# Patient Record
Sex: Male | Born: 1955
Health system: Southern US, Community
[De-identification: ages and names within clinical notes are randomized; demographics above are authoritative.]

## PROBLEM LIST (undated history)

## (undated) DIAGNOSIS — I639 Cerebral infarction, unspecified: Secondary | ICD-10-CM

## (undated) DIAGNOSIS — E039 Hypothyroidism, unspecified: Secondary | ICD-10-CM

## (undated) DIAGNOSIS — F329 Major depressive disorder, single episode, unspecified: Secondary | ICD-10-CM

## (undated) DIAGNOSIS — F32A Depression, unspecified: Secondary | ICD-10-CM

## (undated) DIAGNOSIS — I10 Essential (primary) hypertension: Secondary | ICD-10-CM

## (undated) DIAGNOSIS — F1910 Other psychoactive substance abuse, uncomplicated: Secondary | ICD-10-CM

## (undated) DIAGNOSIS — I4891 Unspecified atrial fibrillation: Secondary | ICD-10-CM

## (undated) DIAGNOSIS — E785 Hyperlipidemia, unspecified: Secondary | ICD-10-CM

## (undated) HISTORY — PX: ROTATOR CUFF REPAIR: SHX139

## (undated) HISTORY — DX: Essential (primary) hypertension: I10

## (undated) HISTORY — PX: FRACTURE SURGERY: SHX138

## (undated) HISTORY — DX: Major depressive disorder, single episode, unspecified: F32.9

## (undated) HISTORY — DX: Other psychoactive substance abuse, uncomplicated: F19.10

## (undated) HISTORY — DX: Hyperlipidemia, unspecified: E78.5

## (undated) HISTORY — DX: Hypothyroidism, unspecified: E03.9

## (undated) HISTORY — DX: Depression, unspecified: F32.A

---
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
## 2007-10-03 ENCOUNTER — Emergency Department (HOSPITAL_COMMUNITY): Admission: EM | Admit: 2007-10-03 | Discharge: 2007-10-03 | Payer: Self-pay | Admitting: Emergency Medicine

## 2008-11-29 ENCOUNTER — Emergency Department (HOSPITAL_COMMUNITY): Admission: EM | Admit: 2008-11-29 | Discharge: 2008-11-29 | Payer: Self-pay | Admitting: Emergency Medicine

## 2008-12-03 ENCOUNTER — Emergency Department (HOSPITAL_COMMUNITY): Admission: EM | Admit: 2008-12-03 | Discharge: 2008-12-03 | Payer: Self-pay | Admitting: Emergency Medicine

## 2008-12-06 ENCOUNTER — Emergency Department (HOSPITAL_COMMUNITY): Admission: EM | Admit: 2008-12-06 | Discharge: 2008-12-06 | Payer: Self-pay | Admitting: Emergency Medicine

## 2009-01-26 ENCOUNTER — Encounter (HOSPITAL_COMMUNITY): Admission: RE | Admit: 2009-01-26 | Discharge: 2009-02-17 | Payer: Self-pay | Admitting: Orthopedic Surgery

## 2009-02-23 ENCOUNTER — Encounter (HOSPITAL_COMMUNITY): Admission: RE | Admit: 2009-02-23 | Discharge: 2009-03-25 | Payer: Self-pay | Admitting: Orthopedic Surgery

## 2009-03-26 ENCOUNTER — Encounter (HOSPITAL_COMMUNITY): Admission: RE | Admit: 2009-03-26 | Discharge: 2009-04-25 | Payer: Self-pay | Admitting: Orthopedic Surgery

## 2009-04-27 ENCOUNTER — Encounter (HOSPITAL_COMMUNITY): Admission: RE | Admit: 2009-04-27 | Discharge: 2009-05-27 | Payer: Self-pay | Admitting: Orthopedic Surgery

## 2011-02-23 ENCOUNTER — Emergency Department (HOSPITAL_COMMUNITY)
Admission: EM | Admit: 2011-02-23 | Discharge: 2011-02-23 | Disposition: A | Discharge status: Home / Self Care | Payer: Self-pay | Attending: Emergency Medicine | Admitting: Emergency Medicine

## 2011-02-23 ENCOUNTER — Encounter: Payer: Self-pay | Admitting: *Deleted

## 2011-02-23 ENCOUNTER — Emergency Department (HOSPITAL_COMMUNITY): Payer: Self-pay

## 2011-02-23 DIAGNOSIS — M79609 Pain in unspecified limb: Secondary | ICD-10-CM | POA: Insufficient documentation

## 2011-02-23 DIAGNOSIS — S51809A Unspecified open wound of unspecified forearm, initial encounter: Secondary | ICD-10-CM | POA: Insufficient documentation

## 2011-02-23 DIAGNOSIS — IMO0002 Reserved for concepts with insufficient information to code with codable children: Secondary | ICD-10-CM

## 2011-02-23 DIAGNOSIS — F17200 Nicotine dependence, unspecified, uncomplicated: Secondary | ICD-10-CM | POA: Insufficient documentation

## 2011-02-23 MED ORDER — OXYCODONE-ACETAMINOPHEN 5-325 MG PO TABS: 1.0000 | ORAL_TABLET | ORAL | Status: AC | PRN

## 2011-02-23 MED ORDER — BACITRACIN ZINC 500 UNIT/GM EX OINT
TOPICAL_OINTMENT | CUTANEOUS | Status: AC
  Administered 2011-02-23: 1 via TOPICAL
  Filled 2011-02-23: qty 0.9

## 2011-02-23 MED ORDER — BACITRACIN-NEOMYCIN-POLYMYXIN 400-5-5000 EX OINT: TOPICAL_OINTMENT | Freq: Once | CUTANEOUS | Status: DC

## 2011-02-23 MED ORDER — DOUBLE ANTIBIOTIC 500-10000 UNIT/GM EX OINT: TOPICAL_OINTMENT | Freq: Once | CUTANEOUS | Status: DC

## 2011-02-23 MED ORDER — LIDOCAINE HCL (PF) 1 % IJ SOLN
INTRAMUSCULAR | Status: AC
  Filled 2011-02-23: qty 10

## 2011-02-23 MED ORDER — BACITRACIN ZINC 500 UNIT/GM EX OINT
TOPICAL_OINTMENT | Freq: Once | CUTANEOUS | Status: AC
  Administered 2011-02-23: 1 via TOPICAL

## 2011-02-23 MED ORDER — CEPHALEXIN 500 MG PO CAPS: 500.0000 mg | ORAL_CAPSULE | Freq: Four times a day (QID) | ORAL | Status: AC

## 2011-02-23 NOTE — ED Notes (Signed)
Cleaned wound with wound cleanser and placed sterile 4x 4's over lac.  nad noted.

## 2011-02-23 NOTE — ED Notes (Signed)
Pt arrived via EMS with laceration left lower FA, bleeding controlled.  States jumped off back of truck and cut arm on tailgate.  CMS intact, ROM wnl.  nad noted.

## 2011-02-23 NOTE — ED Provider Notes (Signed)
History     CSN: [id number]  Arrival date & time 02/23/11  1014   First MD Initiated Contact with Patient 02/23/11 1055      Chief Complaint  Patient presents with  . Extremity Laceration    (Consider location/radiation/quality/duration/timing/severity/associated sxs/prior treatment) HPI Comments: Patient c/o laceration to his left distal forearm that occurred when he accidentally fell from the back of a truck and thinks he may have cut his arm on the tailgate or the trailer hitch.  He denies numbness, weakness or decreased ROM of the hand or wrist.  Also denies other injuries  Patient is a 56 y.o. male presenting with skin laceration. The history is provided by the patient.  Laceration  The incident occurred 1 to 2 hours ago. The laceration is located on the left arm. The laceration is 9 cm in size. The laceration mechanism was a a metal edge. The pain is mild. The pain has been constant since onset. He reports no foreign bodies present. His tetanus status is UTD.    History reviewed. No pertinent past medical history.  Past Surgical History  Procedure Date  . Rotator cuff repair     right shoulder    No family history on file.  History  Substance Use Topics  . Smoking status: Current Everyday Smoker    Types: Cigarettes  . Smokeless tobacco: Not on file  . Alcohol Use: No      Review of Systems  HENT: Negative for neck pain and neck stiffness.   Musculoskeletal: Negative for back pain, joint swelling and arthralgias.  Skin: Positive for wound.  Neurological: Negative for weakness, numbness and headaches.  Hematological: Does not bruise/bleed easily.    Allergies  Review of patient's allergies indicates no known allergies.  Home Medications  No current outpatient prescriptions on file.  BP 124/92  Pulse 72  Temp(Src) 98 F (36.7 C) (Oral)  Resp 16  Ht 6' (1.829 m)  Wt 200 lb (90.719 kg)  BMI 27.12 kg/m2  SpO2 93%  Physical Exam  Nursing note and  vitals reviewed. Constitutional: He is oriented to person, place, and time. He appears well-developed and well-nourished. No distress.  HENT:  Head: Normocephalic and atraumatic.  Neck: Normal range of motion. Neck supple.  Cardiovascular: Normal rate, regular rhythm and normal heart sounds.   Pulmonary/Chest: Effort normal and breath sounds normal. No respiratory distress. He exhibits no tenderness.  Musculoskeletal: Normal range of motion. He exhibits no edema and no tenderness.       Right forearm: He exhibits laceration.       Arms: Lymphadenopathy:    He has no cervical adenopathy.  Neurological: He is alert and oriented to person, place, and time. He has normal reflexes. No cranial nerve deficit. He exhibits normal muscle tone. Coordination normal.  Skin: Skin is warm and dry.       9 cm flap type laceration to the distal left forearm.  Bleeding controlled.  No obvious tendon, muscle or nerve injury seen.  Pt has full ROM of the wrist , hand and fingers.  Sensation intact, radial pulse is brisk, CR<2 sec    ED Course  Procedures (including critical care time)  Labs Reviewed - No data to display Dg Forearm Left  02/23/2011  *RADIOLOGY REPORT*  Clinical Data: Fall.  Laceration.  LEFT FOREARM - 2 VIEW  Comparison: None.  Findings: Prominent soft tissue injury adjacent to the distal left ulna without underlying fracture or radiopaque foreign body.  Curvature of  the ulna may be related to remote fracture.  Radiopaque material proximal humerus may be outside the patient.  IMPRESSION: Prominent soft tissue injury adjacent to the distal left ulna without underlying fracture or radiopaque foreign body.  Curvature of the ulna may be related to remote fracture.  Radiopaque material proximal humerus may be outside the patient  Original Report Authenticated By: Fuller Canada, M.D.     LACERATION REPAIR Performed by: Maxwell Caul. Authorized by: Maxwell Caul Consent: Verbal consent  obtained. Risks and benefits: risks, benefits and alternatives were discussed Consent given by: patient Patient identity confirmed: provided demographic data Prepped and Draped in normal sterile fashion Wound explored  Laceration Location: left wrist Laceration Length: 9cm  No Foreign Bodies seen or palpated  Anesthesia: local infiltration  Local anesthetic: lidocaine 1% w/o epinephrine  Anesthetic total: 6 ml  Irrigation method: syringe Amount of cleaning: standard  Skin closure: 4-0 prolene Number of sutures: 15 Technique: simple interrupted Patient tolerance: Patient tolerated the procedure well with no immediate complications.    MDM     Wound(s) explored with adequate hemostasis through ROM, no apparent gross foreign body retained, no significant involvement of deep structures such as bone / joint / tendon / or neurovascular involvement noted.  Baseline Strength and Sensation to affected extremity(ies) with normal light touch for Pt, distal NVI with CR< 2 secs and pulse(s) intact to affected extremity(ies).    Bleeding controlled prior to wound closure.  Pt has full ROM of all fingers and wrist.  Grip strength is strong and equal bilaterally.  Radial pulse is brisk.  Pt agrees to return here in 1-2 days for recheck if any signs of infection or increased pain or swelling.   Agrees to return here in 1-2 days for any signs of infection or change in sensation    Perry Martinez, [place] 02/24/11 2212

## 2011-02-25 NOTE — ED Provider Notes (Signed)
Medical screening examination/treatment/procedure(s) were performed by non-physician practitioner and as supervising physician I was immediately available for consultation/collaboration.   Berl Bonfanti W. Reginal Wojcicki, MD 02/25/11 1600 

## 2016-06-27 ENCOUNTER — Encounter: Payer: Self-pay | Admitting: Family Medicine

## 2016-06-27 ENCOUNTER — Ambulatory Visit (INDEPENDENT_AMBULATORY_CARE_PROVIDER_SITE_OTHER): Payer: Self-pay | Admitting: Family Medicine

## 2016-06-27 VITALS — BP 146/96 | HR 64 | Temp 98.2°F | Resp 16 | Ht 72.0 in | Wt 209.0 lb

## 2016-06-27 DIAGNOSIS — Z720 Tobacco use: Secondary | ICD-10-CM

## 2016-06-27 DIAGNOSIS — R030 Elevated blood-pressure reading, without diagnosis of hypertension: Secondary | ICD-10-CM | POA: Insufficient documentation

## 2016-06-27 DIAGNOSIS — Z7689 Persons encountering health services in other specified circumstances: Secondary | ICD-10-CM

## 2016-06-27 DIAGNOSIS — R252 Cramp and spasm: Secondary | ICD-10-CM

## 2016-06-27 DIAGNOSIS — E785 Hyperlipidemia, unspecified: Secondary | ICD-10-CM

## 2016-06-27 LAB — CBC
HCT: 42 % (ref 38.5–50.0)
Hemoglobin: 14.4 g/dL (ref 13.2–17.1)
MCH: 28.4 pg (ref 27.0–33.0)
MCHC: 34.3 g/dL (ref 32.0–36.0)
MCV: 82.8 fL (ref 80.0–100.0)
MPV: 11.3 fL (ref 7.5–12.5)
PLATELETS: 150 10*3/uL (ref 140–400)
RBC: 5.07 MIL/uL (ref 4.20–5.80)
RDW: 15.3 % — AB (ref 11.0–15.0)
WBC: 10.2 10*3/uL (ref 3.8–10.8)

## 2016-06-27 LAB — TSH: TSH: 19.18 mIU/L — ABNORMAL HIGH (ref 0.40–4.50)

## 2016-06-27 LAB — COMPLETE METABOLIC PANEL WITH GFR
ALT: 24 U/L (ref 9–46)
AST: 27 U/L (ref 10–35)
Albumin: 4.3 g/dL (ref 3.6–5.1)
Alkaline Phosphatase: 64 U/L (ref 40–115)
BILIRUBIN TOTAL: 0.4 mg/dL (ref 0.2–1.2)
BUN: 23 mg/dL (ref 7–25)
CO2: 24 mmol/L (ref 20–31)
Calcium: 9.4 mg/dL (ref 8.6–10.3)
Chloride: 104 mmol/L (ref 98–110)
Creat: 1.27 mg/dL — ABNORMAL HIGH (ref 0.70–1.25)
GFR, EST AFRICAN AMERICAN: 69 mL/min (ref >=60)
GFR, EST NON AFRICAN AMERICAN: 60 mL/min (ref >=60)
GLUCOSE: 83 mg/dL (ref 65–99)
POTASSIUM: 4.6 mmol/L (ref 3.5–5.3)
SODIUM: 137 mmol/L (ref 135–146)
TOTAL PROTEIN: 7.1 g/dL (ref 6.1–8.1)

## 2016-06-27 LAB — LIPID PANEL
CHOL/HDL RATIO: 10.2 ratio — AB (ref <5.0)
Cholesterol: 325 mg/dL — ABNORMAL HIGH (ref <200)
HDL: 32 mg/dL — AB (ref >40)
TRIGLYCERIDES: 740 mg/dL — AB (ref <150)

## 2016-06-27 NOTE — Progress Notes (Signed)
Chief Complaint  Patient presents with  . Establish Care   New to establish No medical care in years I have discussed the multiple health risks associated with cigarette smoking including, but not limited to, cardiovascular disease, lung disease and cancer.  I have strongly recommended that smoking be stopped.  I have reviewed the various methods of quitting including cold [place], classes, nicotine replacements and prescription medications.  I have offered assistance in this difficult process.  The patient is not interested in assistance at this time. Has elevated BP but denies history hypertension Has history hyperlipidemia, took lipitor "20 years ago" No history heart problems His biggest issue is nocturnal leg cramps +/- restless legs Cramps in "hamstring" at night after a lot of ladder climbing during the day.  Has tried quinine, salt, "holistic" treatments.  Worsening for 2-3 years Vague R leg injury after falling from ladder - pain and numbness and weakness with slow recovery.    No shots No colonoscopy Discussed Hep C.  He says he did IV drugs in his youth  I recommend screening.  He declines due to lack of insurance.   Patient Active Problem List   Diagnosis Date Noted  . Tobacco abuse 06/27/2016  . Hyperlipidemia 06/27/2016  . Bilateral leg cramps 06/27/2016  . Elevated blood pressure, situational 06/27/2016    Outpatient Encounter Prescriptions as of 06/27/2016  Medication Sig  . ibuprofen (ADVIL,MOTRIN) 200 MG tablet Take 200 mg by mouth every 6 (six) hours as needed.   No facility-administered encounter medications on file as of 06/27/2016.     Past Medical History:  Diagnosis Date  . Depression   . Hyperlipidemia   . Hypertension     Past Surgical History:  Procedure Laterality Date  . FRACTURE SURGERY     right foot  . ROTATOR CUFF REPAIR     right shoulder    Social History   Social History  . Marital status: Significant Other    Spouse name: [name]    . Number of children: 2  . Years of education: GED/ tech   Occupational History  . build houses     self   Social History Main Topics  . Smoking status: Current Every Day Smoker    Packs/day: 0.50    Years: 50.00    Types: Cigarettes    Start date: 06/28/1966  . Smokeless tobacco: Never Used     Comment: previously smoked 1ppd  . Alcohol use No  . Drug use: No  . Sexual activity: Not Currently   Other Topics Concern  . Not on file   Social History Narrative   Lives with Ursula AlertWendi and her 63 year old daughter and 63 year old son   Self employed   - build houses    Family History  Problem Relation Age of Onset  . Heart disease Mother     dieed at 983  . Arthritis Mother   . Diabetes Mother   . Heart disease Father 45    bypass  . Arthritis Father   . Hyperlipidemia Father   . Hypertension Father   . Rheumatic fever Father     Review of Systems  Constitutional: Positive for malaise/fatigue. Negative for chills, fever and weight loss.  HENT: Negative for congestion and hearing loss.        Poor dentition  Eyes: Negative for blurred vision and pain.  Respiratory: Negative for cough and shortness of breath.   Cardiovascular: Negative for chest pain, palpitations and  leg swelling.  Gastrointestinal: Negative for blood in stool, constipation, diarrhea and heartburn.  Genitourinary: Negative for dysuria and frequency.  Musculoskeletal: Positive for back pain and myalgias. Negative for falls and joint pain.  Neurological: Negative for dizziness, seizures and headaches.  Psychiatric/Behavioral: Positive for depression. The patient is nervous/anxious and has insomnia.     BP (!) 146/96 (BP Location: Right Arm, Patient Position: Sitting, Cuff Size: Normal)   Pulse 64   Temp 98.2 F (36.8 C) (Temporal)   Resp 16   Ht 6' (1.829 m)   Wt 209 lb 0.6 oz (94.8 kg)   SpO2 97%   BMI 28.35 kg/m   Physical Exam  Constitutional: He is oriented to person, place, and time. He  appears well-developed and well-nourished. He appears distressed.  Mildly anxious  HENT:  Head: Normocephalic and atraumatic.  Mouth/Throat: Oropharynx is clear and moist.  Poor dentition  Eyes: Conjunctivae are normal. Pupils are equal, round, and reactive to light.  Neck: Normal range of motion.  Cardiovascular: Normal rate, regular rhythm, normal heart sounds and intact distal pulses.   One ectopic beat  Pulmonary/Chest: Effort normal and breath sounds normal. No respiratory distress. He has no wheezes.  Abdominal: Soft. Bowel sounds are normal.  No HSM  Musculoskeletal: Normal range of motion.  Right calf atrophy noted  Lymphadenopathy:    He has no cervical adenopathy.  Neurological: He is alert and oriented to person, place, and time. He displays normal reflexes. A sensory deficit is present. Coordination normal.  Skin: Skin is warm and dry.  Psychiatric: His behavior is normal. Thought content normal.    1. Encounter to establish care with new doctor  2. Tobacco abuse Advised to quit.  Offered assistance.  "too nervous"  3. Hyperlipidemia, unspecified hyperlipidemia type  4. Bilateral leg cramps  - CBC - COMPLETE METABOLIC PANEL WITH GFR - Lipid panel - VITAMIN D 25 Hydroxy (Vit-D Deficiency, Fractures) - Urinalysis, Routine w reflex microscopic - Magnesium - TSH  5. Elevated blood pressure, situational    Patient Instructions  Drink plenty of water Stretch the legs every night before bed Take vitamin B complex a couple of times a day I will send you a letter with your test results.  If there is anything of concern, we will call right away. Come back for a blood pressure check next week Stop adding salt  See me yearly Call or e mail sooner for problems    Eustace Moore, MD

## 2016-06-27 NOTE — Patient Instructions (Signed)
Drink plenty of water Stretch the legs every night before bed Take vitamin B complex a couple of times a day I will send you a letter with your test results.  If there is anything of concern, we will call right away. Come back for a blood pressure check next week Stop adding salt  See me yearly Call or e mail sooner for problems

## 2016-06-28 ENCOUNTER — Encounter: Payer: Self-pay | Admitting: Family Medicine

## 2016-06-28 ENCOUNTER — Other Ambulatory Visit: Payer: Self-pay | Admitting: Family Medicine

## 2016-06-28 DIAGNOSIS — E039 Hypothyroidism, unspecified: Secondary | ICD-10-CM

## 2016-06-28 DIAGNOSIS — E559 Vitamin D deficiency, unspecified: Secondary | ICD-10-CM | POA: Insufficient documentation

## 2016-06-28 HISTORY — DX: Hypothyroidism, unspecified: E03.9

## 2016-06-28 LAB — URINALYSIS, ROUTINE W REFLEX MICROSCOPIC
Bilirubin Urine: NEGATIVE
GLUCOSE, UA: NEGATIVE
HGB URINE DIPSTICK: NEGATIVE
KETONES UR: NEGATIVE
Nitrite: NEGATIVE
PH: 6 (ref 5.0–8.0)
PROTEIN: NEGATIVE
Specific Gravity, Urine: 1.019 (ref 1.001–1.035)

## 2016-06-28 LAB — MAGNESIUM: MAGNESIUM: 2.1 mg/dL (ref 1.5–2.5)

## 2016-06-28 LAB — URINALYSIS, MICROSCOPIC ONLY
Bacteria, UA: NONE SEEN [HPF]
CRYSTALS: NONE SEEN [HPF]
Casts: NONE SEEN [LPF]
Squamous Epithelial / LPF: NONE SEEN [HPF] (ref <=5)
Yeast: NONE SEEN [HPF]

## 2016-06-28 LAB — VITAMIN D 25 HYDROXY (VIT D DEFICIENCY, FRACTURES): Vit D, 25-Hydroxy: 24 ng/mL — ABNORMAL LOW (ref 30–100)

## 2016-06-28 MED ORDER — LOVASTATIN 40 MG PO TABS: 40.0000 mg | ORAL_TABLET | Freq: Every day | ORAL | 3 refills | Status: DC

## 2016-06-28 MED ORDER — LEVOTHYROXINE SODIUM 100 MCG PO TABS: 100.0000 ug | ORAL_TABLET | Freq: Every day | ORAL | 3 refills | Status: DC

## 2016-07-28 ENCOUNTER — Ambulatory Visit: Payer: Self-pay | Admitting: Family Medicine

## 2017-05-01 ENCOUNTER — Encounter: Payer: Self-pay | Admitting: Family Medicine

## 2018-07-04 ENCOUNTER — Other Ambulatory Visit: Payer: Self-pay

## 2018-07-04 ENCOUNTER — Emergency Department: Payer: Medicare Other

## 2018-07-04 ENCOUNTER — Emergency Department
Admission: EM | Admit: 2018-07-04 | Discharge: 2018-07-04 | Disposition: A | Discharge status: Home / Self Care | Payer: Medicare Other | Attending: Emergency Medicine | Admitting: Emergency Medicine

## 2018-07-04 DIAGNOSIS — R0600 Dyspnea, unspecified: Secondary | ICD-10-CM | POA: Diagnosis not present

## 2018-07-04 DIAGNOSIS — I10 Essential (primary) hypertension: Secondary | ICD-10-CM | POA: Insufficient documentation

## 2018-07-04 DIAGNOSIS — F17210 Nicotine dependence, cigarettes, uncomplicated: Secondary | ICD-10-CM | POA: Diagnosis not present

## 2018-07-04 DIAGNOSIS — R0602 Shortness of breath: Secondary | ICD-10-CM | POA: Diagnosis present

## 2018-07-04 DIAGNOSIS — I4891 Unspecified atrial fibrillation: Secondary | ICD-10-CM | POA: Diagnosis not present

## 2018-07-04 LAB — CBC
HCT: 41.5 % (ref 39.0–52.0)
Hemoglobin: 12.9 g/dL — ABNORMAL LOW (ref 13.0–17.0)
MCH: 27.7 pg (ref 26.0–34.0)
MCHC: 31.1 g/dL (ref 30.0–36.0)
MCV: 89.2 fL (ref 80.0–100.0)
Platelets: 151 10*3/uL (ref 150–400)
RBC: 4.65 MIL/uL (ref 4.22–5.81)
RDW: 14.6 % (ref 11.5–15.5)
WBC: 9 10*3/uL (ref 4.0–10.5)
nRBC: 0 % (ref 0.0–0.2)

## 2018-07-04 LAB — COMPREHENSIVE METABOLIC PANEL
ALT: 48 U/L — ABNORMAL HIGH (ref 0–44)
AST: 35 U/L (ref 15–41)
Albumin: 4.1 g/dL (ref 3.5–5.0)
Alkaline Phosphatase: 80 U/L (ref 38–126)
Anion gap: 8 (ref 5–15)
BUN: 24 mg/dL — ABNORMAL HIGH (ref 8–23)
CO2: 24 mmol/L (ref 22–32)
Calcium: 9.4 mg/dL (ref 8.9–10.3)
Chloride: 107 mmol/L (ref 98–111)
Creatinine, Ser: 0.86 mg/dL (ref 0.61–1.24)
GFR calc Af Amer: >60 mL/min (ref >60)
GFR calc non Af Amer: >60 mL/min (ref >60)
Glucose, Bld: 94 mg/dL (ref 70–99)
Potassium: 4.3 mmol/L (ref 3.5–5.1)
Sodium: 139 mmol/L (ref 135–145)
Total Bilirubin: 1 mg/dL (ref 0.3–1.2)
Total Protein: 7.5 g/dL (ref 6.5–8.1)

## 2018-07-04 LAB — TROPONIN I: Troponin I: 0.04 ng/mL (ref <0.03)

## 2018-07-04 MED ORDER — TRAZODONE HCL 50 MG PO TABS: 50.0000 mg | ORAL_TABLET | Freq: Every evening | ORAL | 0 refills | Status: DC | PRN

## 2018-07-04 MED ORDER — APIXABAN 5 MG PO TABS
5.0000 mg | ORAL_TABLET | Freq: Two times a day (BID) | ORAL | Status: DC
  Administered 2018-07-04: 15:00:00 5 mg via ORAL

## 2018-07-04 MED ORDER — APIXABAN 5 MG PO TABS: 5.0000 mg | ORAL_TABLET | Freq: Two times a day (BID) | ORAL | 1 refills | Status: DC

## 2018-07-04 MED ORDER — METOPROLOL SUCCINATE ER 50 MG PO TB24: 50.0000 mg | ORAL_TABLET | Freq: Every day | ORAL | 1 refills | Status: DC

## 2018-07-04 MED ORDER — DILTIAZEM HCL 25 MG/5ML IV SOLN
10.0000 mg | Freq: Once | INTRAVENOUS | Status: AC
  Administered 2018-07-04: 10 mg via INTRAVENOUS
  Filled 2018-07-04 (×2): qty 5

## 2018-07-04 MED ORDER — DILTIAZEM HCL 60 MG PO TABS
60.0000 mg | ORAL_TABLET | Freq: Once | ORAL | Status: AC
  Administered 2018-07-04: 60 mg via ORAL
  Filled 2018-07-04: qty 1

## 2018-07-04 NOTE — ED Provider Notes (Signed)
[facility] Emergency Department Provider Note  Time seen: 1:28 PM  I have reviewed the triage vital signs and the nursing notes.   HISTORY  Chief Complaint Shortness of Breath   HPI Perry Martinez is a 63 y.o. male with a past medical history of hypertension, hyperlipidemia, depression, anxiety, presents to the emergency department for shortness of breath.  According to the patient over the past 1 month he has been feeling intermittently short of breath especially with exertion.  He states he can only carry a bag of concrete approximately 50 feet before he would have to rest.  Patient denies any chest pain.  Denies any fever cough.  Patient's main concern today appears to be anxiety.  States he has been feeling very anxious over the past few weeks, states he has not been sleeping well at night which he believes is contributing to his symptoms.  Patient is a daily smoker of approximately half a pack per day.  Patient went to urgent care today for evaluation and was sent to the emergency department.   Past Medical History:  Diagnosis Date  . Depression   . Hyperlipidemia   . Hypertension   . Hypothyroidism 06/28/2016  . Substance abuse (HCC)    IV drugs when younger    Patient Active Problem List   Diagnosis Date Noted  . Hypothyroidism 06/28/2016  . Vitamin D deficiency 06/28/2016  . Tobacco abuse 06/27/2016  . Hyperlipidemia 06/27/2016  . Bilateral leg cramps 06/27/2016  . Elevated blood pressure, situational 06/27/2016    Past Surgical History:  Procedure Laterality Date  . FRACTURE SURGERY     right foot  . ROTATOR CUFF REPAIR     right shoulder    Prior to Admission medications   Medication Sig Start Date End Date Taking? Authorizing Provider  ibuprofen (ADVIL,MOTRIN) 200 MG tablet Take 200 mg by mouth every 6 (six) hours as needed.    [provider]  levothyroxine (SYNTHROID, LEVOTHROID) 100 MCG tablet Take 1 tablet (100 mcg total)  by mouth daily. 06/28/16   Eustace Moore, MD  lovastatin (MEVACOR) 40 MG tablet Take 1 tablet (40 mg total) by mouth at bedtime. 06/28/16   Eustace Moore, MD    No Known Allergies  Family History  Problem Relation Age of Onset  . Heart disease Mother        dieed at 74  . Arthritis Mother   . Diabetes Mother   . Heart disease Father 45       bypass  . Arthritis Father   . Hyperlipidemia Father   . Hypertension Father   . Rheumatic fever Father     Social History Social History   Tobacco Use  . Smoking status: Current Every Day Smoker    Packs/day: 0.50    Years: 50.00    Pack years: 25.00    Types: Cigarettes    Start date: 06/28/1966  . Smokeless tobacco: Never Used  . Tobacco comment: previously smoked 1ppd  Substance Use Topics  . Alcohol use: No  . Drug use: No    Review of Systems Constitutional: Negative for fever. ENT: Negative for recent illness/congestion Cardiovascular: Negative for chest pain. Respiratory: Mild shortness of breath worse with exertion over the past 1 months Gastrointestinal: Negative for abdominal pain, vomiting Genitourinary: Negative for urinary compaints Musculoskeletal: Negative for musculoskeletal complaints Skin: Negative for skin complaints  Neurological: Negative for headache All other ROS negative  ____________________________________________   PHYSICAL EXAM:  VITAL SIGNS: ED Triage Vitals  Enc Vitals Group     BP 07/04/18 1325 (!) 119/95     Pulse Rate 07/04/18 1325 (!) 126     Resp 07/04/18 1314 16     Temp 07/04/18 1325 98.1 F (36.7 C)     Temp Source 07/04/18 1325 Oral     SpO2 07/04/18 1314 99 %     Weight 07/04/18 1220 200 lb (90.7 kg)     Height 07/04/18 1220 6' (1.829 m)     Head Circumference --      Peak Flow --      Pain Score 07/04/18 1220 0     Pain Loc --      Pain Edu? --      Excl. in GC? --     Constitutional: Alert and oriented. Well appearing and in no distress. Eyes: Normal  exam ENT      Head: Normocephalic and atraumatic      Mouth/Throat: Mucous membranes are moist. Cardiovascular: Normal rate, regular rhythm.  Respiratory: Normal respiratory effort without tachypnea nor retractions. Breath sounds are clear  Gastrointestinal: Soft and nontender. No distention.   Musculoskeletal: Nontender with normal range of motion in all extremities. Neurologic:  Normal speech and language. No gross focal neurologic deficits Skin:  Skin is warm, dry and intact.  Psychiatric: Mood and affect are normal.   ____________________________________________    EKG  EKG viewed and interpreted by myself shows atrial fibrillation at 117 bpm, narrow QRS, normal axis, normal intervals, no concerning ST changes.  ____________________________________________    RADIOLOGY  Chest x-ray shows cardiomegaly with interstitial edema.  ____________________________________________   INITIAL IMPRESSION / ASSESSMENT AND PLAN / ED COURSE  Pertinent labs & imaging results that were available during my care of the patient were reviewed by me and considered in my medical decision making (see chart for details).   Patient presents to the emergency department for shortness of breath worse with exertion over the past 1 month.  Denies any chest pain.  Denies any fever cough.  Patient also states he feels anxious, and has been having trouble sleeping at night.  Overall the patient appears well, no distress.  Clear lung sounds on exam.  We will check labs including cardiac enzymes, chest x-ray and continue to closely monitor.  Patient's work-up shows a very slight troponin elevation 0.04.  Patient's EKG appears show atrial fibrillation and rapid ventricular response which is new for the patient.  Chest x-ray shows mild interstitial edema.  Patient given 10 mg of IV diltiazem.  Heart rate around 90 bpm.  I discussed the patient with Dr. Lennette BihariKohn of cardiology.  We will start the patient on Eliquis 5 mg  twice daily.  We will start the patient on metoprolol 50 mg once daily.  Patient will follow-up with Dr. Lennette BihariKohn at 10 AM on Friday.  Patient agreeable to plan of care.  Perry Martinez was evaluated in Emergency Department on 07/04/2018 for the symptoms described in the history of present illness. He was evaluated in the context of the global COVID-19 pandemic, which necessitated consideration that the patient might be at risk for infection with the SARS-CoV-2 virus that causes COVID-19. Institutional protocols and algorithms that pertain to the evaluation of patients at risk for COVID-19 are in a state of rapid change based on information released by regulatory bodies including the CDC and federal and state organizations. These policies and algorithms were followed during the patient's care in the ED.  ____________________________________________   FINAL CLINICAL IMPRESSION(S) / ED DIAGNOSES  Dyspnea New onset atrial fibrillation with rapid ventricular response   Minna Antis, MD 07/04/18 1447

## 2018-07-04 NOTE — ED Triage Notes (Signed)
Pt sent from [facility] with c/o increased SOB over the past month, with chest xray showing scant pleural fluid BL. Pt is in NAD. Denies cough, fever or other sx

## 2018-07-04 NOTE — ED Notes (Signed)
ED Provider at bedside. 

## 2018-07-04 NOTE — Discharge Instructions (Signed)
Please follow-up with Dr. Welton Flakes at 10 AM this Friday, 07/06/2018.  Please arrive 15 minutes early for your appointment.  Please take your medications as prescribed starting this evening.  Return to the emergency department for any worsening shortness of breath development of chest pain, or any other symptom personally concerning to yourself.

## 2018-07-09 ENCOUNTER — Emergency Department
Admission: EM | Admit: 2018-07-09 | Discharge: 2018-07-09 | Discharge status: Absent without leave | Payer: Medicare Other

## 2018-07-09 ENCOUNTER — Other Ambulatory Visit: Payer: Self-pay

## 2018-08-09 ENCOUNTER — Other Ambulatory Visit: Payer: Self-pay

## 2018-08-09 ENCOUNTER — Inpatient Hospital Stay: Payer: Medicare Other

## 2018-08-09 ENCOUNTER — Encounter: Payer: Self-pay | Admitting: Emergency Medicine

## 2018-08-09 ENCOUNTER — Emergency Department: Payer: Medicare Other

## 2018-08-09 ENCOUNTER — Inpatient Hospital Stay
Admission: EM | Admit: 2018-08-09 | Discharge: 2018-08-12 | DRG: 065 | Disposition: A | Discharge status: Home Health | Payer: Medicare Other | Attending: Internal Medicine | Admitting: Internal Medicine

## 2018-08-09 DIAGNOSIS — N179 Acute kidney failure, unspecified: Secondary | ICD-10-CM | POA: Diagnosis present

## 2018-08-09 DIAGNOSIS — Z833 Family history of diabetes mellitus: Secondary | ICD-10-CM

## 2018-08-09 DIAGNOSIS — R29810 Facial weakness: Secondary | ICD-10-CM | POA: Diagnosis present

## 2018-08-09 DIAGNOSIS — I6349 Cerebral infarction due to embolism of other cerebral artery: Principal | ICD-10-CM | POA: Diagnosis present

## 2018-08-09 DIAGNOSIS — R471 Dysarthria and anarthria: Secondary | ICD-10-CM | POA: Diagnosis present

## 2018-08-09 DIAGNOSIS — I639 Cerebral infarction, unspecified: Secondary | ICD-10-CM

## 2018-08-09 DIAGNOSIS — E039 Hypothyroidism, unspecified: Secondary | ICD-10-CM | POA: Diagnosis present

## 2018-08-09 DIAGNOSIS — I10 Essential (primary) hypertension: Secondary | ICD-10-CM | POA: Diagnosis present

## 2018-08-09 DIAGNOSIS — E860 Dehydration: Secondary | ICD-10-CM | POA: Diagnosis present

## 2018-08-09 DIAGNOSIS — F329 Major depressive disorder, single episode, unspecified: Secondary | ICD-10-CM | POA: Diagnosis present

## 2018-08-09 DIAGNOSIS — Z79899 Other long term (current) drug therapy: Secondary | ICD-10-CM | POA: Diagnosis not present

## 2018-08-09 DIAGNOSIS — Z7982 Long term (current) use of aspirin: Secondary | ICD-10-CM | POA: Diagnosis not present

## 2018-08-09 DIAGNOSIS — Z8249 Family history of ischemic heart disease and other diseases of the circulatory system: Secondary | ICD-10-CM | POA: Diagnosis not present

## 2018-08-09 DIAGNOSIS — F17210 Nicotine dependence, cigarettes, uncomplicated: Secondary | ICD-10-CM | POA: Diagnosis present

## 2018-08-09 DIAGNOSIS — Z8349 Family history of other endocrine, nutritional and metabolic diseases: Secondary | ICD-10-CM

## 2018-08-09 DIAGNOSIS — F419 Anxiety disorder, unspecified: Secondary | ICD-10-CM | POA: Diagnosis present

## 2018-08-09 DIAGNOSIS — R29701 NIHSS score 1: Secondary | ICD-10-CM | POA: Diagnosis present

## 2018-08-09 DIAGNOSIS — R2689 Other abnormalities of gait and mobility: Secondary | ICD-10-CM | POA: Diagnosis present

## 2018-08-09 DIAGNOSIS — Z7901 Long term (current) use of anticoagulants: Secondary | ICD-10-CM | POA: Diagnosis not present

## 2018-08-09 DIAGNOSIS — Z79890 Hormone replacement therapy (postmenopausal): Secondary | ICD-10-CM | POA: Diagnosis not present

## 2018-08-09 DIAGNOSIS — R278 Other lack of coordination: Secondary | ICD-10-CM | POA: Diagnosis present

## 2018-08-09 DIAGNOSIS — G2581 Restless legs syndrome: Secondary | ICD-10-CM | POA: Diagnosis present

## 2018-08-09 DIAGNOSIS — E785 Hyperlipidemia, unspecified: Secondary | ICD-10-CM | POA: Diagnosis present

## 2018-08-09 DIAGNOSIS — I4891 Unspecified atrial fibrillation: Secondary | ICD-10-CM | POA: Diagnosis present

## 2018-08-09 DIAGNOSIS — Z1159 Encounter for screening for other viral diseases: Secondary | ICD-10-CM | POA: Diagnosis not present

## 2018-08-09 HISTORY — DX: Cerebral infarction, unspecified: I63.9

## 2018-08-09 HISTORY — DX: Unspecified atrial fibrillation: I48.91

## 2018-08-09 LAB — CBC
HCT: 45 % (ref 39.0–52.0)
Hemoglobin: 14.6 g/dL (ref 13.0–17.0)
MCH: 27.5 pg (ref 26.0–34.0)
MCHC: 32.4 g/dL (ref 30.0–36.0)
MCV: 84.7 fL (ref 80.0–100.0)
Platelets: 166 10*3/uL (ref 150–400)
RBC: 5.31 MIL/uL (ref 4.22–5.81)
RDW: 14.8 % (ref 11.5–15.5)
WBC: 12 10*3/uL — ABNORMAL HIGH (ref 4.0–10.5)
nRBC: 0 % (ref 0.0–0.2)

## 2018-08-09 LAB — URINE DRUG SCREEN, QUALITATIVE (ARMC ONLY)
Amphetamines, Ur Screen: NOT DETECTED
Barbiturates, Ur Screen: NOT DETECTED
Benzodiazepine, Ur Scrn: POSITIVE — AB
Cannabinoid 50 Ng, Ur ~~LOC~~: POSITIVE — AB
Cocaine Metabolite,Ur ~~LOC~~: NOT DETECTED
MDMA (Ecstasy)Ur Screen: NOT DETECTED
Methadone Scn, Ur: NOT DETECTED
Opiate, Ur Screen: NOT DETECTED
Phencyclidine (PCP) Ur S: NOT DETECTED
Tricyclic, Ur Screen: NOT DETECTED

## 2018-08-09 LAB — COMPREHENSIVE METABOLIC PANEL
ALT: 46 U/L — ABNORMAL HIGH (ref 0–44)
AST: 38 U/L (ref 15–41)
Albumin: 4.3 g/dL (ref 3.5–5.0)
Alkaline Phosphatase: 97 U/L (ref 38–126)
Anion gap: 10 (ref 5–15)
BUN: 33 mg/dL — ABNORMAL HIGH (ref 8–23)
CO2: 26 mmol/L (ref 22–32)
Calcium: 9.2 mg/dL (ref 8.9–10.3)
Chloride: 101 mmol/L (ref 98–111)
Creatinine, Ser: 1.36 mg/dL — ABNORMAL HIGH (ref 0.61–1.24)
GFR calc Af Amer: >60 mL/min (ref >60)
GFR calc non Af Amer: 54 mL/min — ABNORMAL LOW (ref >60)
Glucose, Bld: 120 mg/dL — ABNORMAL HIGH (ref 70–99)
Potassium: 4.3 mmol/L (ref 3.5–5.1)
Sodium: 137 mmol/L (ref 135–145)
Total Bilirubin: 0.7 mg/dL (ref 0.3–1.2)
Total Protein: 7.8 g/dL (ref 6.5–8.1)

## 2018-08-09 LAB — URINALYSIS, COMPLETE (UACMP) WITH MICROSCOPIC
Bacteria, UA: NONE SEEN
Bilirubin Urine: NEGATIVE
Glucose, UA: NEGATIVE mg/dL
Hgb urine dipstick: NEGATIVE
Ketones, ur: NEGATIVE mg/dL
Leukocytes,Ua: NEGATIVE
Nitrite: NEGATIVE
Protein, ur: NEGATIVE mg/dL
Specific Gravity, Urine: 1.018 (ref 1.005–1.030)
Squamous Epithelial / HPF: NONE SEEN (ref 0–5)
pH: 6 (ref 5.0–8.0)

## 2018-08-09 LAB — DIFFERENTIAL
Abs Immature Granulocytes: 0.05 10*3/uL (ref 0.00–0.07)
Basophils Absolute: 0 10*3/uL (ref 0.0–0.1)
Basophils Relative: 0 %
Eosinophils Absolute: 0.2 10*3/uL (ref 0.0–0.5)
Eosinophils Relative: 2 %
Immature Granulocytes: 0 %
Lymphocytes Relative: 18 %
Lymphs Abs: 2.1 10*3/uL (ref 0.7–4.0)
Monocytes Absolute: 0.7 10*3/uL (ref 0.1–1.0)
Monocytes Relative: 6 %
Neutro Abs: 8.9 10*3/uL — ABNORMAL HIGH (ref 1.7–7.7)
Neutrophils Relative %: 74 %

## 2018-08-09 LAB — PROTIME-INR
INR: 1.4 — ABNORMAL HIGH (ref 0.8–1.2)
Prothrombin Time: 16.5 seconds — ABNORMAL HIGH (ref 11.4–15.2)

## 2018-08-09 LAB — APTT: aPTT: 39 seconds — ABNORMAL HIGH (ref 24–36)

## 2018-08-09 MED ORDER — ESCITALOPRAM OXALATE 10 MG PO TABS
10.0000 mg | ORAL_TABLET | Freq: Every day | ORAL | Status: DC
  Administered 2018-08-10 – 2018-08-12 (×3): 10 mg via ORAL
  Filled 2018-08-09 (×3): qty 1

## 2018-08-09 MED ORDER — ACETAMINOPHEN 325 MG PO TABS
650.0000 mg | ORAL_TABLET | ORAL | Status: DC | PRN
  Administered 2018-08-12: 09:00:00 650 mg via ORAL
  Filled 2018-08-09: qty 2

## 2018-08-09 MED ORDER — ASPIRIN 81 MG PO CHEW
81.0000 mg | CHEWABLE_TABLET | Freq: Every day | ORAL | Status: DC
  Administered 2018-08-10 – 2018-08-12 (×3): 81 mg via ORAL
  Filled 2018-08-09 (×3): qty 1

## 2018-08-09 MED ORDER — METOPROLOL SUCCINATE ER 50 MG PO TB24
50.0000 mg | ORAL_TABLET | Freq: Every day | ORAL | Status: DC
  Administered 2018-08-12: 09:00:00 50 mg via ORAL
  Filled 2018-08-09 (×2): qty 1

## 2018-08-09 MED ORDER — ACETAMINOPHEN 160 MG/5ML PO SOLN
650.0000 mg | ORAL | Status: DC | PRN
  Filled 2018-08-09: qty 20.3

## 2018-08-09 MED ORDER — GADOBUTROL 1 MMOL/ML IV SOLN
9.0000 mL | Freq: Once | INTRAVENOUS | Status: AC | PRN
  Administered 2018-08-09: 9 mL via INTRAVENOUS

## 2018-08-09 MED ORDER — DIAZEPAM 5 MG PO TABS
5.0000 mg | ORAL_TABLET | Freq: Three times a day (TID) | ORAL | Status: DC | PRN
  Administered 2018-08-11: 5 mg via ORAL
  Filled 2018-08-09: qty 1

## 2018-08-09 MED ORDER — ACETAMINOPHEN 650 MG RE SUPP: 650.0000 mg | RECTAL | Status: DC | PRN

## 2018-08-09 MED ORDER — SENNOSIDES-DOCUSATE SODIUM 8.6-50 MG PO TABS: 1.0000 | ORAL_TABLET | Freq: Every evening | ORAL | Status: DC | PRN

## 2018-08-09 MED ORDER — SODIUM CHLORIDE 0.9 % IV SOLN
INTRAVENOUS | Status: DC
  Administered 2018-08-09: via INTRAVENOUS

## 2018-08-09 MED ORDER — AMIODARONE HCL 200 MG PO TABS
200.0000 mg | ORAL_TABLET | Freq: Two times a day (BID) | ORAL | Status: DC
  Administered 2018-08-10 – 2018-08-12 (×5): 200 mg via ORAL
  Filled 2018-08-09 (×6): qty 1

## 2018-08-09 MED ORDER — ROSUVASTATIN CALCIUM 20 MG PO TABS
40.0000 mg | ORAL_TABLET | Freq: Every day | ORAL | Status: DC
  Administered 2018-08-10 – 2018-08-12 (×3): 40 mg via ORAL
  Filled 2018-08-09 (×3): qty 2

## 2018-08-09 MED ORDER — ROPINIROLE HCL 0.25 MG PO TABS
0.2500 mg | ORAL_TABLET | Freq: Every day | ORAL | Status: DC
  Administered 2018-08-10 – 2018-08-11 (×2): 0.25 mg via ORAL
  Filled 2018-08-09 (×4): qty 1

## 2018-08-09 MED ORDER — NICOTINE 14 MG/24HR TD PT24
14.0000 mg | MEDICATED_PATCH | Freq: Every day | TRANSDERMAL | Status: DC
  Administered 2018-08-09 – 2018-08-12 (×4): 14 mg via TRANSDERMAL
  Filled 2018-08-09 (×4): qty 1

## 2018-08-09 MED ORDER — APIXABAN 5 MG PO TABS
5.0000 mg | ORAL_TABLET | Freq: Two times a day (BID) | ORAL | Status: DC
  Administered 2018-08-09 – 2018-08-12 (×6): 5 mg via ORAL
  Filled 2018-08-09 (×6): qty 1

## 2018-08-09 MED ORDER — STROKE: EARLY STAGES OF RECOVERY BOOK
Freq: Once | Status: AC
  Administered 2018-08-09: 22:00:00

## 2018-08-09 NOTE — ED Notes (Addendum)
ED TO INPATIENT HANDOFF REPORT  ED Nurse Name and Phone #: Jeannett SeniorStephen [phone]  S Name/Age/Gender Robert Bellowouglas W Rode 63 y.o. male Room/Bed: ED10A/ED10A  Code Status   Code Status: Not on file  Home/SNF/Other Home Patient oriented to: self, place, time and situation Is this baseline? Yes   Triage Complete: Triage complete  Chief Complaint poss stroke  Triage Note Ys last night he had an episode of inability to speak and he couldn't move right.  He got up this am and was ok, but on way to work he had an episode of left arm not working.  Says he stopped at a store, couldn't walk well.  Lasted about an hour.  Currently no drift no numbness, no facial droopl   Allergies No Known Allergies  Level of Care/Admitting Diagnosis ED Disposition    ED Disposition Condition Comment   Admit  Hospital Area: [facility] [[id number]]  Level of Care: Med-Surg [16]  Covid Evaluation: Screening Protocol (No Symptoms)  Diagnosis: Acute CVA (cerebrovascular accident) [facility](HCC) [[id number]]) [[id number]]  Admitting Physician: Shaune PollackHEN, QING [phone][[id number]]  Attending Physician: Shaune PollackCHEN, QING [[id number]]  Estimated length of stay: past midnight tomorrow  Certification:: I certify this patient will need inpatient services for at least 2 midnights  PT Class (Do Not Modify): Inpatient [101]  PT Acc Code (Do Not Modify): Private [1]       B Medical/Surgery History Past Medical History:  Diagnosis Date  . Acute CVA (cerebrovascular accident) (HCC) 08/09/2018  . Atrial fibrillation (HCC)   . Depression   . Hyperlipidemia   . Hypertension   . Hypothyroidism 06/28/2016  . Substance abuse (HCC)    IV drugs when younger   Past Surgical History:  Procedure Laterality Date  . FRACTURE SURGERY     right foot  . ROTATOR CUFF REPAIR     right shoulder     A IV Location/Drains/Wounds Patient Lines/Drains/Airways Status   Active Line/Drains/Airways    None          Intake/Output Last 24 hours  Intake/Output  Summary (Last 24 hours) at 08/09/2018 1933 Last data filed at 08/09/2018 1815 Gross per 24 hour  Intake -  Output 325 ml  Net -325 ml    Labs/Imaging Results for orders placed or performed during the hospital encounter of 08/09/18 (from the past 48 hour(s))  Protime-INR     Status: Abnormal   Collection Time: 08/09/18 12:42 PM  Result Value Ref Range   Prothrombin Time 16.5 (H) 11.4 - 15.2 seconds   INR 1.4 (H) 0.8 - 1.2    Comment: (NOTE) INR goal varies based on device and disease states. Performed at [facility], [address] [place], [place] [address]   APTT     Status: Abnormal   Collection Time: 08/09/18 12:42 PM  Result Value Ref Range   aPTT 39 (H) 24 - 36 seconds    Comment:        IF BASELINE aPTT IS ELEVATED, SUGGEST PATIENT RISK ASSESSMENT BE USED TO DETERMINE APPROPRIATE ANTICOAGULANT THERAPY. Performed at [facility], [address] [place], [place] [address]   CBC     Status: Abnormal   Collection Time: 08/09/18 12:42 PM  Result Value Ref Range   WBC 12.0 (H) 4.0 - 10.5 K/uL   RBC 5.31 4.22 - 5.81 MIL/uL   Hemoglobin 14.6 13.0 - 17.0 g/dL   HCT 65.745.0 84.639.0 - 96.252.0 %   MCV 84.7 80.0 - 100.0 fL   MCH 27.5  26.0 - 34.0 pg   MCHC 32.4 30.0 - 36.0 g/dL   RDW 16.114.8 09.611.5 - 04.515.5 %   Platelets 166 150 - 400 K/uL   nRBC 0.0 0.0 - 0.2 %    Comment: Performed at [facility], [address] [place], [place] [address]  Differential     Status: Abnormal   Collection Time: 08/09/18 12:42 PM  Result Value Ref Range   Neutrophils Relative % 74 %   Neutro Abs 8.9 (H) 1.7 - 7.7 K/uL   Lymphocytes Relative 18 %   Lymphs Abs 2.1 0.7 - 4.0 K/uL   Monocytes Relative 6 %   Monocytes Absolute 0.7 0.1 - 1.0 K/uL   Eosinophils Relative 2 %   Eosinophils Absolute 0.2 0.0 - 0.5 K/uL   Basophils Relative 0 %   Basophils Absolute 0.0 0.0 - 0.1 K/uL   Immature Granulocytes 0 %   Abs Immature Granulocytes 0.05 0.00 - 0.07 K/uL    Comment:  Performed at [facility], [address] [place], [place] [address]  Comprehensive metabolic panel     Status: Abnormal   Collection Time: 08/09/18 12:42 PM  Result Value Ref Range   Sodium 137 135 - 145 mmol/L   Potassium 4.3 3.5 - 5.1 mmol/L   Chloride 101 98 - 111 mmol/L   CO2 26 22 - 32 mmol/L   Glucose, Bld 120 (H) 70 - 99 mg/dL   BUN 33 (H) 8 - 23 mg/dL   Creatinine, Ser 8.291.36 (H) 0.61 - 1.24 mg/dL   Calcium 9.2 8.9 - 56.210.3 mg/dL   Total Protein 7.8 6.5 - 8.1 g/dL   Albumin 4.3 3.5 - 5.0 g/dL   AST 38 15 - 41 U/L   ALT 46 (H) 0 - 44 U/L   Alkaline Phosphatase 97 38 - 126 U/L   Total Bilirubin 0.7 0.3 - 1.2 mg/dL   GFR calc non Af Amer 54 (L) >60 mL/min   GFR calc Af Amer >60 >60 mL/min   Anion gap 10 5 - 15    Comment: Performed at [facility], [address] [place], [place] [address]   Ct Head Wo Contrast  Result Date: 08/09/2018 CLINICAL DATA:  Slurred speech dizziness and left arm weakness since yesterday. EXAM: CT HEAD WITHOUT CONTRAST TECHNIQUE: Contiguous axial images were obtained from the base of the skull through the vertex without intravenous contrast. COMPARISON:  11/29/2008 FINDINGS: Brain: No evidence of acute infarction, hemorrhage, hydrocephalus, extra-axial collection or mass lesion/mass effect. Vascular: No hyperdense vessel or unexpected calcification. Skull: Normal. Negative for fracture or focal lesion. Sinuses/Orbits: Globes and orbits are within normal limits. Visualized sinuses and mastoid air cells are clear. Other: None. IMPRESSION: Normal unenhanced CT scan of the brain. Electronically Signed   By: Amie Portlandavid  Ormond M.D.   On: 08/09/2018 12:38    Pending Labs Unresulted Labs (From admission, onward)    Start     Ordered   08/09/18 1927  Novel Coronavirus,NAA,(SEND-OUT TO REF LAB - TAT 24-48 hrs); Hosp Order  (Asymptomatic Patients Labs)  Once,   STAT    Question:  Rule Out  Answer:  Yes   08/09/18 1926   08/09/18 1907  Urinalysis,  Complete w Microscopic  Once,   STAT     08/09/18 1906   08/09/18 1907  Urine Drug Screen, Qualitative ([facility] only)  Once,   STAT     08/09/18 1906   Signed and Held  HIV antibody (Routine Testing)  Once,   R     Signed and Held   Signed and Held  Hemoglobin A1c  Tomorrow morning,   R     Signed and Held   Signed and Held  Lipid panel  Tomorrow morning,   R    Comments: Fasting    Signed and Held          Vitals/Pain Today's Vitals   08/09/18 1756 08/09/18 1800 08/09/18 1811 08/09/18 1830  BP: (!) 129/98 (!) 130/97    Pulse: 72 (!) 57  78  Resp: 17 16  11   SpO2: 99% 99%  99%  Weight:      Height:      PainSc:   0-No pain     Isolation Precautions No active isolations  Medications Medications - No data to display  Mobility walks Low fall risk   Focused Assessments Neuro Assessment Handoff:  Swallow screen pass? Yes    NIH Stroke Scale ( + Modified Stroke Scale Criteria)  Level of Consciousness (1a.)   : Alert, keenly responsive LOC Questions (1b. )   +: Answers both questions correctly LOC Commands (1c. )   + : Performs both tasks correctly Best Gaze (2. )  +: Normal Visual (3. )  +: No visual loss Facial Palsy (4. )    : Normal symmetrical movements Motor Arm, Left (5a. )   +: Drift Motor Arm, Right (5b. )   +: No drift Motor Leg, Left (6a. )   +: No drift Motor Leg, Right (6b. )   +: No drift Limb Ataxia (7. ): Absent Sensory (8. )   +: Normal, no sensory loss Best Language (9. )   +: No aphasia Dysarthria (10. ): Normal Extinction/Inattention (11.)   +: No Abnormality Modified SS Total  +: 1 Complete NIHSS TOTAL: 1     Neuro Assessment: Exceptions to WDL Neuro Checks:      Last Documented NIHSS Modified Score: 1 (08/09/18 1813) Has TPA been given? No If patient is a Neuro Trauma and patient is going to OR before floor call report to [place] nurse: [phone] or [phone]     R Recommendations: See Admitting Provider Note  Report  given to: Roswell Miners, RN  Additional Notes:

## 2018-08-09 NOTE — Progress Notes (Signed)
Advanced Care Plan.  Purpose of Encounter: CODE STATUS. Parties in Attendance: The patient and me. Patient's Decisional Capacity: Yes. Medical Story: Perry Martinez  is a 63 y.o. male with a known history of recently diagnosed A. fib on Eliquis, hypertension, hyperlipidemia, hypothyroidism, depression and substance abuse.  The patient is being admitted for acute CVA.  Discussed with patient about his current condition, prognosis and CODE STATUS.  The patient does want to be resuscitated and intubated if he has cardiopulmonary arrest. Plan:  Code Status: Full code. Time spent discussing advance care planning: 17 minutes.

## 2018-08-09 NOTE — ED Triage Notes (Signed)
Ys last night he had an episode of inability to speak and he couldn't move right.  He got up this am and was ok, but on way to work he had an episode of left arm not working.  Says he stopped at a store, couldn't walk well.  Lasted about an hour.  Currently no drift no numbness, no facial droopl

## 2018-08-09 NOTE — ED Provider Notes (Signed)
[facility] Emergency Department Provider Note  ____________________________________________   First MD Initiated Contact with Patient 08/09/18 1732     (approximate)  I have reviewed the triage vital signs and the nursing notes.   HISTORY  Chief Complaint Cerebrovascular Accident    HPI Perry Martinez is a 63 y.o. male  With PMHx HTN, HLD, prior IVDU here with left facial droop. Pt reports he was just diagnosed with AFib. Has been on Eliquis and has been taking it. Pt states that intermittently over the last several days, he has had episodes in which she feels "out of it" off balance, and feels like his speech is slurred.  The speech has remained slurred over the last 24 hours.  He states that he also noticed some difficulty with coordination and when driving, felt like the car was leaning into the other side of the road.  States he had difficulty correcting it.  The symptoms are all new.  He states he felt short of breath with his prior A. fib, and has not had any episodes of he is been taking his medications as prescribed.  Is been adherent with his Eliquis.  He states that he currently feels like his speech is slurred and that he has some difficulty concentrating.  Denies any focal numbness or weakness.  No other acute complaints.  No headache.  No neck pain or neck stiffness.        Past Medical History:  Diagnosis Date  . Atrial fibrillation (HCC)   . Depression   . Hyperlipidemia   . Hypertension   . Hypothyroidism 06/28/2016  . Substance abuse (HCC)    IV drugs when younger    Patient Active Problem List   Diagnosis Date Noted  . Hypothyroidism 06/28/2016  . Vitamin D deficiency 06/28/2016  . Tobacco abuse 06/27/2016  . Hyperlipidemia 06/27/2016  . Bilateral leg cramps 06/27/2016  . Elevated blood pressure, situational 06/27/2016    Past Surgical History:  Procedure Laterality Date  . FRACTURE SURGERY     right foot  . ROTATOR CUFF  REPAIR     right shoulder    Prior to Admission medications   Medication Sig Start Date End Date Taking? Authorizing Provider  apixaban (ELIQUIS) 5 MG TABS tablet Take 1 tablet (5 mg total) by mouth 2 (two) times daily. 07/04/18   Minna AntisPaduchowski, Kevin, MD  ibuprofen (ADVIL,MOTRIN) 200 MG tablet Take 200 mg by mouth every 6 (six) hours as needed.    [provider]  levothyroxine (SYNTHROID, LEVOTHROID) 100 MCG tablet Take 1 tablet (100 mcg total) by mouth daily. 06/28/16   Eustace MooreNelson, Yvonne Sue, MD  lovastatin (MEVACOR) 40 MG tablet Take 1 tablet (40 mg total) by mouth at bedtime. 06/28/16   Eustace MooreNelson, Yvonne Sue, MD  metoprolol succinate (TOPROL XL) 50 MG 24 hr tablet Take 1 tablet (50 mg total) by mouth daily. Take with or immediately following a meal. 07/04/18 07/04/19  Minna AntisPaduchowski, Kevin, MD  traZODone (DESYREL) 50 MG tablet Take 1 tablet (50 mg total) by mouth at bedtime as needed for sleep. 07/04/18   Minna AntisPaduchowski, Kevin, MD    Allergies Patient has no known allergies.  Family History  Problem Relation Age of Onset  . Heart disease Mother        dieed at 683  . Arthritis Mother   . Diabetes Mother   . Heart disease Father 45       bypass  . Arthritis Father   . Hyperlipidemia  Father   . Hypertension Father   . Rheumatic fever Father     Social History Social History   Tobacco Use  . Smoking status: Current Every Day Smoker    Packs/day: 0.50    Years: 50.00    Pack years: 25.00    Types: Cigarettes    Start date: 06/28/1966  . Smokeless tobacco: Never Used  . Tobacco comment: previously smoked 1ppd  Substance Use Topics  . Alcohol use: No  . Drug use: No    Review of Systems  Review of Systems  Constitutional: Positive for fatigue. Negative for chills and fever.  HENT: Negative for sore throat.   Respiratory: Negative for shortness of breath.   Cardiovascular: Negative for chest pain.  Gastrointestinal: Negative for abdominal pain.  Genitourinary: Negative for flank  pain.  Musculoskeletal: Negative for neck pain.  Skin: Negative for rash and wound.  Allergic/Immunologic: Negative for immunocompromised state.  Neurological: Positive for dizziness, speech difficulty and weakness. Negative for numbness.  Hematological: Does not bruise/bleed easily.  All other systems reviewed and are negative.    ____________________________________________  PHYSICAL EXAM:      VITAL SIGNS: ED Triage Vitals  Enc Vitals Group     BP 08/09/18 1241 112/73     Pulse Rate 08/09/18 1241 63     Resp 08/09/18 1241 18     Temp --      Temp src --      SpO2 08/09/18 1241 96 %     Weight 08/09/18 1207 200 lb (90.7 kg)     Height 08/09/18 1207 6' (1.829 m)     Head Circumference --      Peak Flow --      Pain Score 08/09/18 1207 0     Pain Loc --      Pain Edu? --      Excl. in GC? --      Physical Exam Vitals signs and nursing note reviewed.  Constitutional:      General: He is not in acute distress.    Appearance: He is well-developed.  HENT:     Head: Normocephalic and atraumatic.  Eyes:     Conjunctiva/sclera: Conjunctivae normal.  Neck:     Musculoskeletal: Neck supple.  Cardiovascular:     Rate and Rhythm: Normal rate. Rhythm irregularly irregular.     Heart sounds: Normal heart sounds. No murmur. No friction rub.  Pulmonary:     Effort: Pulmonary effort is normal. No respiratory distress.     Breath sounds: Normal breath sounds. No wheezing or rales.  Abdominal:     General: There is no distension.     Palpations: Abdomen is soft.     Tenderness: There is no abdominal tenderness.  Skin:    General: Skin is warm.     Capillary Refill: Capillary refill takes less than 2 seconds.  Neurological:     Mental Status: He is alert and oriented to person, place, and time.     Motor: No abnormal muscle tone.      Neurological Exam:  Mental Status: Alert and oriented to person, place, and time. Attention and concentration normal. Speech clear. Recent  memory is intact. Cranial Nerves: Visual fields grossly intact. EOMI and PERRLA. No nystagmus noted. Facial sensation intact at forehead, maxillary cheek, and chin/mandible bilaterally. Subtle left NLF. Hearing grossly normal. Uvula is midline, and palate elevates symmetrically. Normal SCM and trapezius strength. Tongue midline without fasciculations. Motor: Muscle strength 5/5 in proximal and  distal UE and LE bilaterally. No pronator drift. Muscle tone normal. Reflexes: 2+ and symmetrical in all four extremities.  Sensation: Intact to light touch in upper and lower extremities distally bilaterally.  Gait: Deferred Coordination: Dysmetria left FTN. ____________________________________________   LABS (all labs ordered are listed, but only abnormal results are displayed)  Labs Reviewed  PROTIME-INR - Abnormal; Notable for the following components:      Result Value   Prothrombin Time 16.5 (*)    INR 1.4 (*)    All other components within normal limits  APTT - Abnormal; Notable for the following components:   aPTT 39 (*)    All other components within normal limits  CBC - Abnormal; Notable for the following components:   WBC 12.0 (*)    All other components within normal limits  DIFFERENTIAL - Abnormal; Notable for the following components:   Neutro Abs 8.9 (*)    All other components within normal limits  COMPREHENSIVE METABOLIC PANEL - Abnormal; Notable for the following components:   Glucose, Bld 120 (*)    BUN 33 (*)    Creatinine, Ser 1.36 (*)    ALT 46 (*)    GFR calc non Af Amer 54 (*)    All other components within normal limits    ____________________________________________  EKG: A. fib, nonspecific T wave changes.  No significant change from prior.  No ST elevations. ________________________________________  RADIOLOGY All imaging, including plain films, CT scans, and ultrasounds, independently reviewed by me, and interpretations confirmed via formal radiology reads.   ED MD interpretation:   CT Head: NAICA  Official radiology report(s): Ct Head Wo Contrast  Result Date: 08/09/2018 CLINICAL DATA:  Slurred speech dizziness and left arm weakness since yesterday. EXAM: CT HEAD WITHOUT CONTRAST TECHNIQUE: Contiguous axial images were obtained from the base of the skull through the vertex without intravenous contrast. COMPARISON:  11/29/2008 FINDINGS: Brain: No evidence of acute infarction, hemorrhage, hydrocephalus, extra-axial collection or mass lesion/mass effect. Vascular: No hyperdense vessel or unexpected calcification. Skull: Normal. Negative for fracture or focal lesion. Sinuses/Orbits: Globes and orbits are within normal limits. Visualized sinuses and mastoid air cells are clear. Other: None. IMPRESSION: Normal unenhanced CT scan of the brain. Electronically Signed   By: Amie Portlandavid  Ormond M.D.   On: 08/09/2018 12:38    ____________________________________________  PROCEDURES   Procedure(s) performed (including Critical Care):  Procedures  ____________________________________________  INITIAL IMPRESSION / MDM / ASSESSMENT AND PLAN / ED COURSE  As part of my medical decision making, I reviewed the following data within the electronic MEDICAL RECORD NUMBER Notes from prior ED visits and [place] Controlled Substance Database      *Perry Martinez was evaluated in Emergency Department on 08/09/2018 for the symptoms described in the history of present illness. He was evaluated in the context of the global COVID-19 pandemic, which necessitated consideration that the patient might be at risk for infection with the SARS-CoV-2 virus that causes COVID-19. Institutional protocols and algorithms that pertain to the evaluation of patients at risk for COVID-19 are in a state of rapid change based on information released by regulatory bodies including the CDC and federal and state organizations. These policies and algorithms were followed during the patient's care in the ED.   Some ED evaluations and interventions may be delayed as a result of limited staffing during the pandemic.*      Medical Decision Making: 63 year old male here with slurred speech, intermittent loss of balance.  On exam, he has past-pointing  on left finger-to-nose, and subtle left facial droop.  Given his recent diagnosis of A. fib, concern for small CVA.  He is outside the window of TPA with no evidence to suggest large vessel occlusion.  Will admit to medicine.  ____________________________________________  FINAL CLINICAL IMPRESSION(S) / ED DIAGNOSES  Final diagnoses:  None     MEDICATIONS GIVEN DURING THIS VISIT:  Medications - No data to display   ED Discharge Orders    None       Note:  This document was prepared using Dragon voice recognition software and may include unintentional dictation errors.   Duffy Bruce, MD 08/09/18 [phone]

## 2018-08-09 NOTE — H&P (Addendum)
[facility] - [place] at [facility]   PATIENT NAME: Perry ComberDouglas Farina    MR#:  [id number]  DATE OF BIRTH:  10/07/1955  DATE OF ADMISSION:  08/09/2018  PRIMARY CARE PHYSICIAN: Patient, No Pcp Per   REQUESTING/REFERRING PHYSICIAN: Shaune PollackIsaacs, Cameron, MD  CHIEF COMPLAINT:   Chief Complaint  Patient presents with  . Cerebrovascular Accident   Acute CVA. HISTORY OF PRESENT ILLNESS:  Perry Martinez  is a 63 y.o. male with a known history of recently diagnosed A. fib on Eliquis, hypertension, hyperlipidemia, hypothyroidism, depression and substance abuse.  The patient was diagnosed with A. fib 1 month ago and started Eliquis.  The patient presents the ED with slurred speech, left arm weakness and numbness, difficult walking and driving with coordination for a few days.  He also complains of loss of balance.  He denies any headache, dizziness or syncope.  CT head did not report CVA but since the patient has high risk and symptoms of CVA, ED physician request admission for acute CVA.  PAST MEDICAL HISTORY:   Past Medical History:  Diagnosis Date  . Atrial fibrillation (HCC)   . Depression   . Hyperlipidemia   . Hypertension   . Hypothyroidism 06/28/2016  . Substance abuse (HCC)    IV drugs when younger    PAST SURGICAL HISTORY:   Past Surgical History:  Procedure Laterality Date  . FRACTURE SURGERY     right foot  . ROTATOR CUFF REPAIR     right shoulder    SOCIAL HISTORY:   Social History   Tobacco Use  . Smoking status: Current Every Day Smoker    Packs/day: 0.50    Years: 50.00    Pack years: 25.00    Types: Cigarettes    Start date: 06/28/1966  . Smokeless tobacco: Never Used  . Tobacco comment: previously smoked 1ppd  Substance Use Topics  . Alcohol use: No    FAMILY HISTORY:   Family History  Problem Relation Age of Onset  . Heart disease Mother        dieed at 783  . Arthritis Mother   . Diabetes Mother   . Heart disease Father 45   bypass  . Arthritis Father   . Hyperlipidemia Father   . Hypertension Father   . Rheumatic fever Father     DRUG ALLERGIES:  No Known Allergies  REVIEW OF SYSTEMS:   Review of Systems  Constitutional: Negative for chills, fever and malaise/fatigue.  HENT: Negative for sore throat.   Eyes: Negative for blurred vision and double vision.  Respiratory: Negative for cough, hemoptysis, shortness of breath, wheezing and stridor.   Cardiovascular: Negative for chest pain, palpitations, orthopnea and leg swelling.  Gastrointestinal: Negative for abdominal pain, blood in stool, diarrhea, melena, nausea and vomiting.  Genitourinary: Negative for dysuria, flank pain and hematuria.  Musculoskeletal: Negative for back pain and joint pain.  Skin: Negative for rash.  Neurological: Positive for sensory change and focal weakness. Negative for dizziness, seizures, loss of consciousness, weakness and headaches.       Loss of balance, slurred speech  Endo/Heme/Allergies: Negative for polydipsia.  Psychiatric/Behavioral: Negative for depression. The patient is not nervous/anxious.     MEDICATIONS AT HOME:   Prior to Admission medications   Medication Sig Start Date End Date Taking? Authorizing Provider  amiodarone (PACERONE) 200 MG tablet Take 200 mg by mouth 2 (two) times daily.   Yes [provider]  apixaban (ELIQUIS) 5 MG TABS tablet Take  1 tablet (5 mg total) by mouth 2 (two) times daily. 07/04/18  Yes Minna AntisPaduchowski, Kevin, MD  aspirin 81 MG chewable tablet Chew 81 mg by mouth daily.   Yes [provider]  diazepam (VALIUM) 5 MG tablet Take 5 mg by mouth 3 (three) times daily as needed. 07/17/18  Yes [provider]  escitalopram (LEXAPRO) 10 MG tablet TAKE 1 TABLET BY MOUTH EVERY DAY IN THE MORNING 07/17/18  Yes [provider]  furosemide (LASIX) 20 MG tablet Take 20 mg by mouth daily. 07/26/18  Yes [provider]  ibuprofen (ADVIL,MOTRIN) 200 MG tablet  Take 200 mg by mouth every 6 (six) hours as needed.   Yes [provider]  metoprolol succinate (TOPROL XL) 50 MG 24 hr tablet Take 1 tablet (50 mg total) by mouth daily. Take with or immediately following a meal. 07/04/18 07/04/19 Yes Paduchowski, Caryn BeeKevin, MD  rOPINIRole (REQUIP) 0.25 MG tablet USE AS DIRECTED TAKE ONE TAB EVERY NIGHT FOR 2 NIGHTS THEN 2 TABS EVERY NIGHT 07/17/18  Yes [provider]  rosuvastatin (CRESTOR) 40 MG tablet Take 40 mg by mouth daily. 07/06/18  Yes [provider]      VITAL SIGNS:  Blood pressure (!) 130/97, pulse 78, resp. rate 11, height 6' (1.829 m), weight 90.7 kg, SpO2 99 %.  PHYSICAL EXAMINATION:  Physical Exam  GENERAL:  63 y.o.-year-old patient lying in the bed with no acute distress.  EYES: Pupils equal, round, reactive to light and accommodation. No scleral icterus. Extraocular muscles intact.  HEENT: Head atraumatic, normocephalic. Oropharynx and nasopharynx clear.  NECK:  Supple, no jugular venous distention. No thyroid enlargement, no tenderness.  LUNGS: Normal breath sounds bilaterally, no wheezing, rales,rhonchi or crepitation. No use of accessory muscles of respiration.  CARDIOVASCULAR: S1, S2 normal. No murmurs, rubs, or gallops.  ABDOMEN: Soft, nontender, nondistended. Bowel sounds present. No organomegaly or mass.  EXTREMITIES: No pedal edema, cyanosis, or clubbing.  Left arm weakness. NEUROLOGIC: Cranial nerves II through XII are intact. Muscle strength 5/5 in all extremities except left arm. Sensation intact. Gait not checked.  PSYCHIATRIC: The patient is alert and oriented x 3.  SKIN: No obvious rash, lesion, or ulcer.   LABORATORY PANEL:   CBC Recent Labs  Lab 08/09/18 1242  WBC 12.0*  HGB 14.6  HCT 45.0  PLT 166   ------------------------------------------------------------------------------------------------------------------  Chemistries  Recent Labs  Lab 08/09/18 1242  NA 137  K 4.3  CL 101   CO2 26  GLUCOSE 120*  BUN 33*  CREATININE 1.36*  CALCIUM 9.2  AST 38  ALT 46*  ALKPHOS 97  BILITOT 0.7   ------------------------------------------------------------------------------------------------------------------  Cardiac Enzymes No results for input(s): TROPONINI in the last 168 hours. ------------------------------------------------------------------------------------------------------------------  RADIOLOGY:  Ct Head Wo Contrast  Result Date: 08/09/2018 CLINICAL DATA:  Slurred speech dizziness and left arm weakness since yesterday. EXAM: CT HEAD WITHOUT CONTRAST TECHNIQUE: Contiguous axial images were obtained from the base of the skull through the vertex without intravenous contrast. COMPARISON:  11/29/2008 FINDINGS: Brain: No evidence of acute infarction, hemorrhage, hydrocephalus, extra-axial collection or mass lesion/mass effect. Vascular: No hyperdense vessel or unexpected calcification. Skull: Normal. Negative for fracture or focal lesion. Sinuses/Orbits: Globes and orbits are within normal limits. Visualized sinuses and mastoid air cells are clear. Other: None. IMPRESSION: Normal unenhanced CT scan of the brain. Electronically Signed   By: Amie Portlandavid  Ormond M.D.   On: 08/09/2018 12:38      IMPRESSION AND PLAN:   Acute CVA. The  patient will be admitted to medical floor. Continue Eliquis and aspirin, continue Crestor, neurochecks, MRI/MRA of the brain, carotid duplex and echocardiograph.  PT evaluation.  Neurology consult in the morning.  Acute renal failure due to dehydration.  Normal saline IV and follow-up BMP.  Hold Lasix. A. fib.  Rate controlled, continue amiodarone and Lopressor, continue Eliquis.  Hypertension.  Continue home hypertension medication. Tobacco abuse.  Smoking cessation was counseled for 3 to 4 minutes, nicotine patch.  All the records are reviewed and case discussed with ED provider. Management plans discussed with the patient, family and they  are in agreement.  CODE STATUS:   TOTAL TIME TAKING CARE OF THIS PATIENT: 46 minutes.    Demetrios Loll M.D on 08/09/2018 at 7:15 PM  Between 7am to 6pm - Pager - [phone]  After 6pm go to [email or web address] - Proofreader  [facility]  Office  [phone]  CC: Primary care physician; Patient, No Pcp Per   Note: This dictation was prepared with Dragon dictation along with smaller phrase technology. Any transcriptional errors that result from this process are unin

## 2018-08-10 ENCOUNTER — Inpatient Hospital Stay: Payer: Medicare Other

## 2018-08-10 LAB — LIPID PANEL
Cholesterol: 146 mg/dL (ref 0–200)
HDL: 27 mg/dL — ABNORMAL LOW (ref >40)
LDL Cholesterol: 50 mg/dL (ref 0–99)
Total CHOL/HDL Ratio: 5.4 RATIO
Triglycerides: 343 mg/dL — ABNORMAL HIGH (ref <150)
VLDL: 69 mg/dL — ABNORMAL HIGH (ref 0–40)

## 2018-08-10 LAB — HEMOGLOBIN A1C
Hgb A1c MFr Bld: 5.8 % — ABNORMAL HIGH (ref 4.8–5.6)
Mean Plasma Glucose: 119.76 mg/dL

## 2018-08-10 MED ORDER — SODIUM CHLORIDE 0.9 % IV BOLUS
250.0000 mL | Freq: Once | INTRAVENOUS | Status: AC
  Administered 2018-08-10: 250 mL via INTRAVENOUS

## 2018-08-10 NOTE — Evaluation (Addendum)
Speech Language Pathology Evaluation Patient Details Name: Perry Martinez MRN: [id number] DOB: 02/15/1956 Today's Date: 08/10/2018 Time: 1015-1100 SLP Time Calculation (min) (ACUTE ONLY): 45 min  Problem List:  Patient Active Problem List   Diagnosis Date Noted  . Acute CVA (cerebrovascular accident) (HCC) 08/09/2018  . Hypothyroidism 06/28/2016  . Vitamin D deficiency 06/28/2016  . Tobacco abuse 06/27/2016  . Hyperlipidemia 06/27/2016  . Bilateral leg cramps 06/27/2016  . Elevated blood pressure, situational 06/27/2016   Past Medical History:  Past Medical History:  Diagnosis Date  . Acute CVA (cerebrovascular accident) (HCC) 08/09/2018  . Atrial fibrillation (HCC)   . Depression   . Hyperlipidemia   . Hypertension   . Hypothyroidism 06/28/2016  . Substance abuse (HCC)    IV drugs when younger   Past Surgical History:  Past Surgical History:  Procedure Laterality Date  . FRACTURE SURGERY     right foot  . ROTATOR CUFF REPAIR     right shoulder   HPI:  Pt is a 63 y.o. male with a known history of recently diagnosed A. fib on Eliquis, hypertension, hyperlipidemia, hypothyroidism, depression and substance abuse.  The patient was diagnosed with A. fib 1 month ago and started Eliquis.  The patient presents the ED with slurred speech, left arm weakness and numbness, difficult walking and driving with coordination for a few days.  He also complains of loss of balance.  He denies any headache, dizziness or syncope.  CT head did not report CVA but since the patient has high risk and symptoms of CVA, ED physician request admission for acute CVA.    Assessment / Plan / Recommendation Clinical Impression  Pt appears to present w/ Mild Dysarthria c/b min decreased articulation of speech sounds; intelligibility of speech was 90-100% overall. mild Dysfluency noted x2 when pt attempted to talk to fast. When pt slowed his rate, increased volume, and overarticulated, his speech  precision/articulation improved somewhat - pt is also missing front upper Dentition. No other Cognitive-linguistic deficits noted; pt communicated in conversation w/ SLP and others on phone. Brief Dysarthria education given; then exercises and strategies to increase articulation of speech(slow rate, increase volume, overarticulate); education on other factors that can impact intelligibility during conversation such as lacking Dentition, background noise, and lying down while talking. Strategies were practiced briefly w/ pt; few handouts given. Recommend f/u ST services while admitted for further education on strategies and f/u w/ skilled ST services post discharge to address Dysarthria w/ pt in order to improve speech articulation in ADLs.     SLP Assessment  SLP Recommendation/Assessment: All further Speech Lanaguage Pathology  needs can be addressed in the next venue of care SLP Visit Diagnosis: Dysarthria and anarthria (R47.1)    Follow Up Recommendations  Outpatient SLP    Frequency and Duration x1 x1      SLP Evaluation Cognition  Overall Cognitive Status: Within Functional Limits for tasks assessed Arousal/Alertness: Awake/alert Orientation Level: Oriented X4 Attention: Focused;Sustained Focused Attention: Appears intact Sustained Attention: Appears intact Memory: Appears intact Awareness: Appears intact Problem Solving: Appears intact Behaviors: (n/a) Safety/Judgment: Appears intact       Comprehension  Auditory Comprehension Overall Auditory Comprehension: Appears within functional limits for tasks assessed Yes/No Questions: Within Functional Limits Commands: Within Functional Limits Conversation: Complex Interfering Components: (none) Visual Recognition/Discrimination Discrimination: Not tested Reading Comprehension Reading Status: Within funtional limits    Expression Expression Primary Mode of Expression: Verbal Verbal Expression Overall Verbal Expression:  Appears within functional limits for tasks  assessed Initiation: No impairment Automatic Speech: Name;Social Response Level of Generative/Spontaneous Verbalization: Conversation Repetition: No impairment Naming: No impairment Pragmatics: No impairment Interfering Components: Speech intelligibility(decreased articulation of speech - min) Effective Techniques: (n/a) Non-Verbal Means of Communication: (n/a) Other Verbal Expression Comments: when pt slowed and took his time, articulation of speech improved - also missing dentition Written Expression Dominant Hand: Right Written Expression: Not tested   Oral / Motor  Oral Motor/Sensory Function Overall Oral Motor/Sensory Function: Within functional limits(for strength/ROM) Motor Speech Overall Motor Speech: Impaired(min+) Respiration: Within functional limits Phonation: Normal Resonance: Within functional limits Articulation: Impaired Level of Impairment: Conversation(to word level) Intelligibility: Intelligibility reduced Word: 75-100% accurate Phrase: 75-100% accurate Sentence: 75-100% accurate Conversation: 75-100% accurate Motor Planning: Witnin functional limits Motor Speech Errors: Not applicable Interfering Components: Inadequate dentition Effective Techniques: Slow rate;Increased vocal intensity;Over-articulate   GO                      Orinda Kenner, [place], CCC-SLP Perry Martinez 08/10/2018, 5:08 PM

## 2018-08-10 NOTE — Evaluation (Signed)
Occupational Therapy Evaluation Patient Details Name: Perry Martinez MRN: [id number] DOB: 01/30/1956 Today's Date: 08/10/2018    History of Present Illness presented to ER secondary to L UE > LE weakness and coordination deficits, slurred speech and imbalance with gait; admitted for CVA work up. MRI significant for R paramedian pons infarct.   Clinical Impression   Pt seen for OT evaluation this date. Pt pleasant and eager to work with therapist to maximize his recovery. 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 He lives with his girlfriend in a 1 story home, 1 step to enter. Currently, pt presents with impaired LUE functional use including strength and coordination, impaired balance, and requiring Min-mod assist for LB dressing and bathing tasks 2/2 impaired LUE use. Pt instructed in [facility] ex and activities he can do between therapy sessions and pt able to return demo. Handout provided. Pt eager to do as much as he can to support his return to PLOF and his ability to return to his work. Recommend CIR.     Follow Up Recommendations  CIR    Equipment Recommendations  None recommended by OT    Recommendations for Other Services Rehab consult     Precautions / Restrictions Precautions Precautions: Fall Restrictions Weight Bearing Restrictions: No      Mobility Bed Mobility Overal bed mobility: Modified Independent                Transfers Overall transfer level: Needs assistance   Transfers: Sit to/from Stand Sit to Stand: Min guard;Min assist         General transfer comment: slightly impulsive    Balance Overall balance assessment: Needs assistance Sitting-balance support: No upper extremity supported;Feet supported Sitting balance-Leahy Scale: Good     Standing balance support: No upper extremity supported Standing balance-Leahy Scale: Fair   Single Leg Stance - Right Leg: 0 Single Leg Stance - Left Leg: 0      Rhomberg - Eyes Opened: 5 Rhomberg - Eyes Closed: 3   High Level Balance Comments: delayed and minimal protective righting reactions with external perturbations           ADL either performed or assessed with clinical judgement   ADL Overall ADL's : Needs assistance/impaired Eating/Feeding: Modified independent Eating/Feeding Details (indicate cue type and reason): incorporates non-dom LUE into self feeding simple items (e.g., chips) Grooming: Set up;Min guard;Standing   Upper Body Bathing: Sitting;Minimal assistance   Lower Body Bathing: Sit to/from stand;Moderate assistance;Minimal assistance   Upper Body Dressing : Set up;Sitting;Supervision/safety   Lower Body Dressing: Minimal assistance;Moderate assistance   Toilet Transfer: Ambulation;Minimal assistance;Cueing for safety;Regular Toilet                   Vision Baseline Vision/History: No visual deficits Patient Visual Report: No change from baseline Vision Assessment?: No apparent visual deficits     Perception     Praxis      Pertinent Vitals/Pain Pain Assessment: No/denies pain     Hand Dominance Right   Extremity/Trunk Assessment Upper Extremity Assessment Upper Extremity Assessment: LUE deficits/detail LUE Deficits / Details: strength grossly 4/5 L UE, mod ataxia and dysmetria, denies sensory loss; RUE grossly WFL LUE Coordination: decreased fine motor;decreased gross motor   Lower Extremity Assessment Lower Extremity Assessment: LLE deficits/detail LLE Deficits / Details: L LE grossly 4/5, mild/mod ataxia, denies sensory deficit; R LE grossly WFL   Cervical / Trunk Assessment Cervical / Trunk Assessment: Normal   Communication  Communication Communication: (mild dysarthria, improved with cuing for slower rate of speech and improved articulation/ennunciation)   Cognition Arousal/Alertness: Awake/alert Behavior During Therapy: WFL for tasks assessed/performed Overall Cognitive Status:  Within Functional Limits for tasks assessed                                 General Comments: very eager, motivated for OOB and progression as appropriate   General Comments       Exercises Other Exercises Other Exercises: Educated on importance of slowing speed of movement and gait, minimizing distractions and dual-task activities, purposeful and intentional use of L UE/LE; patient voiced understanding, will continue to reinforce Other Exercises: pt instructed in [facility] ex for LUE and pt able to return demo (with poor quality of movement) proper techniques, handout provided   Shoulder Instructions      Home Living Family/patient expects to be discharged to:: Private residence Living Arrangements: Spouse/significant other Available Help at Discharge: Family;Available 24 hours/day Type of Home: House Home Access: Stairs to enter [employer] CorporationEntrance Stairs-Number of Steps: 1 Entrance Stairs-Rails: None Home Layout: One level     Bathroom Shower/Tub: Chief Strategy OfficerTub/shower unit   Bathroom Toilet: Standard     Home Equipment: None          Prior Functioning/Environment Level of Independence: Independent        Comments: Indep with ADLs, household and community mobilization without assist device; working full-time as Music therapistcarpenter (self-employed).  Denies fall history.        OT Problem List: Decreased strength;Decreased coordination;Impaired UE functional use;Impaired balance (sitting and/or standing)      OT Treatment/Interventions: Self-care/ADL training;Therapeutic exercise;Therapeutic activities;Cognitive remediation/compensation;Neuromuscular education;DME and/or AE instruction;Patient/family education;Energy conservation;Balance training    OT Goals(Current goals can be found in the care plan section) Acute Rehab OT Goals Patient Stated Goal: to get better as fast as I can to return to PLOF OT Goal Formulation: With patient Time For Goal Achievement: 08/24/18 Potential to  Achieve Goals: Good ADL Goals Pt Will Perform Lower Body Dressing: with min assist;sit to/from stand;with min guard assist Pt Will Transfer to Toilet: with min guard assist;ambulating;regular height toilet(LRAD for amb) Additional ADL Goal #1: Pt will perform table top [facility] ADL/IADL activity incorporating B hands requiring supervision for LUE participation.  OT Frequency: Min 3X/week   Barriers to D/C:           Co-evaluation              AM-PAC OT "6 Clicks" Daily Activity     Outcome Measure Help from another person eating meals?: A Little Help from another person taking care of personal grooming?: A Little Help from another person toileting, which includes using toliet, bedpan, or urinal?: A Little Help from another person bathing (including washing, rinsing, drying)?: A Little Help from another person to put on and taking off regular upper body clothing?: A Little Help from another person to put on and taking off regular lower body clothing?: A Little 6 Click Score: 18   End of Session    Activity Tolerance: Patient tolerated treatment well Patient left: in chair;with call bell/phone within reach;with chair alarm set  OT Visit Diagnosis: Other abnormalities of gait and mobility (R26.89);Hemiplegia and hemiparesis Hemiplegia - Right/Left: Left Hemiplegia - dominant/non-dominant: Non-Dominant Hemiplegia - caused by: Cerebral infarction                Time: [id number] OT Time Calculation (min): 22 min Charges:  OT General Charges $OT Visit: 1 Visit OT Evaluation $OT Eval Moderate Complexity: 1 Mod OT Treatments $Neuromuscular Re-education: 8-22 mins  Jeni Salles, MPH, MS, OTR/L ascom [phone] 08/10/18, 3:51 PM

## 2018-08-10 NOTE — Progress Notes (Signed)
OT Cancellation Note  Patient Details Name: Perry Martinez MRN: [id number] DOB: February 24, 1955   Cancelled Treatment:    Reason Eval/Treat Not Completed: Other (comment). Order received, chart reviewed. Pt working with PT upon initial attempt. Will re-attempt OT evaluation at later date/time as pt is available and medically appropriate.  Jeni Salles, MPH, MS, OTR/L ascom [phone] 08/10/18, 1:48 PM

## 2018-08-10 NOTE — Consult Note (Signed)
Chief Complaint: slurred speech/gait imbalnce HPI:  63 y.o. male  With PMHx HTN, HLD, diagnosed with a fib 1 month ago,on elequis for 1 month, prior IVDU admitted with gait imbalance/slurred speech/left facial droop. Patient states that 3 days ago while he was talking to his GF, he noticed that his speech was slurred and when trying to stand up and walk he felt like he was "like a drunk". No loc, no visual disturbances, no nausea/no vting. patient didi not seek medical attention. Patient went to work and He states that he also noticed some difficulty with coordination and when driving, felt like the car was leaning into the other side of the road. Patient decided to come to [facility] when symtpoms did to resolve. 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 Head ct: Normal unenhanced CT scan of the Perry. Mri/MRA on 6/18: 16 mm acute/early subacute infarction within the right paramedianpons. No associated hemorrhage or mass effect.Segment of moderate stenosis of mid basilar artery at the level of infarction with increased T1 signal in the vessel wall compatiblewith focal dissection  Patent carotid and vertebral arteries without significantstenosis by NASCET criteria.  Past Medical History:  Diagnosis Date  . Acute CVA (cerebrovascular accident) (HCC) 08/09/2018  . Atrial fibrillation (HCC)   . Depression   . Hyperlipidemia   . Hypertension   . Hypothyroidism 06/28/2016  . Substance abuse (HCC)    IV drugs when younger    Past Surgical History:  Procedure Laterality Date  . FRACTURE SURGERY     right foot  . ROTATOR CUFF REPAIR     right shoulder    Family History  Problem Relation Age of Onset  . Heart disease Mother        dieed at 5283  . Arthritis Mother   . Diabetes Mother   . Heart disease Father 45       bypass  . Arthritis Father   . Hyperlipidemia Father   . Hypertension Father   . Rheumatic fever Father    Social History:  reports that he has been smoking  cigarettes. He started smoking about 52 years ago. He has a 25.00 pack-year smoking history. He has never used smokeless tobacco. He reports that he does not drink alcohol or use drugs.  Allergies: No Known Allergies  Medications Prior to Admission  Medication Sig Dispense Refill  . amiodarone (PACERONE) 200 MG tablet Take 200 mg by mouth 2 (two) times daily.    Marland Kitchen. apixaban (ELIQUIS) 5 MG TABS tablet Take 1 tablet (5 mg total) by mouth 2 (two) times daily. 60 tablet 1  . aspirin 81 MG chewable tablet Chew 81 mg by mouth daily.    . diazepam (VALIUM) 5 MG tablet Take 5 mg by mouth 3 (three) times daily as needed.    Marland Kitchen. escitalopram (LEXAPRO) 10 MG tablet TAKE 1 TABLET BY MOUTH EVERY DAY IN THE MORNING    . furosemide (LASIX) 20 MG tablet Take 20 mg by mouth daily.    Marland Kitchen. ibuprofen (ADVIL,MOTRIN) 200 MG tablet Take 200 mg by mouth every 6 (six) hours as needed.    . metoprolol succinate (TOPROL XL) 50 MG 24 hr tablet Take 1 tablet (50 mg total) by mouth daily. Take with or immediately following a meal. 30 tablet 1  . rOPINIRole (REQUIP) 0.25 MG tablet USE AS DIRECTED TAKE ONE TAB EVERY NIGHT FOR 2 NIGHTS THEN 2 TABS EVERY NIGHT    . rosuvastatin (CRESTOR) 40 MG  tablet Take 40 mg by mouth daily.      ROS:per HPI  Physical Examination: Blood pressure (!) 123/104, pulse 71, temperature (!) 97.3 F (36.3 C), temperature source Oral, resp. rate 18, height 6' (1.829 m), weight 87.9 kg, SpO2 97 %.  HEENT-  Normocephalic, no lesions, without obvious abnormality.  Normal external eye and conjunctiva.  Normal TM's bilaterally.  Normal auditory canals and external ears. Normal external nose, mucus membranes and septum.  Normal pharynx. Neck supple with no masses, nodes, nodules or enlargement. Cardiovascular - irregularly irregular rhythm Lungs - chest clear, no wheezing, rales, normal symmetric air entry, Heart exam - S1, S2 normal, no murmur, no gallop, rate regular Abdomen - soft, non-tender; bowel  sounds normal; no masses,  no organomegaly Extremities - no edema  Neurologic Examination: Patient is alert, awake, oriented x4, dysarthric, following commands CN: PERLA, EOMI, VFF, no nystagmus, left facial, face sensation is nle, palate and tongue midline No motor deficit appreciated No sensory deficit appreciated Dysmetria on FN and heels to knee on the left Gait not checked at time of examination.  Results for orders placed or performed during the hospital encounter of 08/09/18 (from the past 48 hour(s))  Protime-INR     Status: Abnormal   Collection Time: 08/09/18 12:42 PM  Result Value Ref Range   Prothrombin Time 16.5 (H) 11.4 - 15.2 seconds   INR 1.4 (H) 0.8 - 1.2    Comment: (NOTE) INR goal varies based on device and disease states. Performed at [facility], [address] [place], [place] [address]   APTT     Status: Abnormal   Collection Time: 08/09/18 12:42 PM  Result Value Ref Range   aPTT 39 (H) 24 - 36 seconds    Comment:        IF BASELINE aPTT IS ELEVATED, SUGGEST PATIENT RISK ASSESSMENT BE USED TO DETERMINE APPROPRIATE ANTICOAGULANT THERAPY. Performed at [facility], [address] [place], [place] [address]   CBC     Status: Abnormal   Collection Time: 08/09/18 12:42 PM  Result Value Ref Range   WBC 12.0 (H) 4.0 - 10.5 K/uL   RBC 5.31 4.22 - 5.81 MIL/uL   Hemoglobin 14.6 13.0 - 17.0 g/dL   HCT 09.8 11.9 - 14.7 %   MCV 84.7 80.0 - 100.0 fL   MCH 27.5 26.0 - 34.0 pg   MCHC 32.4 30.0 - 36.0 g/dL   RDW 82.9 56.2 - 13.0 %   Platelets 166 150 - 400 K/uL   nRBC 0.0 0.0 - 0.2 %    Comment: Performed at [facility], [address] [place], [place] [address]  Differential     Status: Abnormal   Collection Time: 08/09/18 12:42 PM  Result Value Ref Range   Neutrophils Relative % 74 %   Neutro Abs 8.9 (H) 1.7 - 7.7 K/uL   Lymphocytes Relative 18 %   Lymphs Abs 2.1 0.7 - 4.0 K/uL   Monocytes Relative 6 %   Monocytes  Absolute 0.7 0.1 - 1.0 K/uL   Eosinophils Relative 2 %   Eosinophils Absolute 0.2 0.0 - 0.5 K/uL   Basophils Relative 0 %   Basophils Absolute 0.0 0.0 - 0.1 K/uL   Immature Granulocytes 0 %   Abs Immature Granulocytes 0.05 0.00 - 0.07 K/uL    Comment: Performed at [facility], [address], [place], [place] [address]  Comprehensive metabolic panel     Status: Abnormal   Collection  Time: 08/09/18 12:42 PM  Result Value Ref Range   Sodium 137 135 - 145 mmol/L   Potassium 4.3 3.5 - 5.1 mmol/L   Chloride 101 98 - 111 mmol/L   CO2 26 22 - 32 mmol/L   Glucose, Bld 120 (H) 70 - 99 mg/dL   BUN 33 (H) 8 - 23 mg/dL   Creatinine, Ser 1.611.36 (H) 0.61 - 1.24 mg/dL   Calcium 9.2 8.9 - 09.610.3 mg/dL   Total Protein 7.8 6.5 - 8.1 g/dL   Albumin 4.3 3.5 - 5.0 g/dL   AST 38 15 - 41 U/L   ALT 46 (H) 0 - 44 U/L   Alkaline Phosphatase 97 38 - 126 U/L   Total Bilirubin 0.7 0.3 - 1.2 mg/dL   GFR calc non Af Amer 54 (L) >60 mL/min   GFR calc Af Amer >60 >60 mL/min   Anion gap 10 5 - 15    Comment: Performed at [facility], [address] [place], [place] [address]  Urinalysis, Complete w Microscopic     Status: Abnormal   Collection Time: 08/09/18  7:07 PM  Result Value Ref Range   Color, Urine YELLOW (A) YELLOW   APPearance CLEAR (A) CLEAR   Specific Gravity, Urine 1.018 1.005 - 1.030   pH 6.0 5.0 - 8.0   Glucose, UA NEGATIVE NEGATIVE mg/dL   Hgb urine dipstick NEGATIVE NEGATIVE   Bilirubin Urine NEGATIVE NEGATIVE   Ketones, ur NEGATIVE NEGATIVE mg/dL   Protein, ur NEGATIVE NEGATIVE mg/dL   Nitrite NEGATIVE NEGATIVE   Leukocytes,Ua NEGATIVE NEGATIVE   RBC / HPF 0-5 0 - 5 RBC/hpf   WBC, UA 0-5 0 - 5 WBC/hpf   Bacteria, UA NONE SEEN NONE SEEN   Squamous Epithelial / LPF NONE SEEN 0 - 5   Mucus PRESENT     Comment: Performed at [facility], [address] [place], [place] [address]  Urine Drug Screen, Qualitative ([facility] only)     Status: Abnormal   Collection  Time: 08/09/18  7:07 PM  Result Value Ref Range   Tricyclic, Ur Screen NONE DETECTED NONE DETECTED   Amphetamines, Ur Screen NONE DETECTED NONE DETECTED   MDMA (Ecstasy)Ur Screen NONE DETECTED NONE DETECTED   Cocaine Metabolite,Ur [place] NONE DETECTED NONE DETECTED   Opiate, Ur Screen NONE DETECTED NONE DETECTED   Phencyclidine (PCP) Ur S NONE DETECTED NONE DETECTED   Cannabinoid 50 Ng, Ur [place] POSITIVE (A) NONE DETECTED   Barbiturates, Ur Screen NONE DETECTED NONE DETECTED   Benzodiazepine, Ur Scrn POSITIVE (A) NONE DETECTED   Methadone Scn, Ur NONE DETECTED NONE DETECTED    Comment: (NOTE) Tricyclics + metabolites, urine    Cutoff 1000 ng/mL Amphetamines + metabolites, urine  Cutoff 1000 ng/mL MDMA (Ecstasy), urine              Cutoff 500 ng/mL Cocaine Metabolite, urine          Cutoff 300 ng/mL Opiate + metabolites, urine        Cutoff 300 ng/mL Phencyclidine (PCP), urine         Cutoff 25 ng/mL Cannabinoid, urine                 Cutoff 50 ng/mL Barbiturates + metabolites, urine  Cutoff 200 ng/mL Benzodiazepine, urine              Cutoff 200 ng/mL Methadone, urine  Cutoff 300 ng/mL The urine drug screen provides only a preliminary, unconfirmed analytical test result and should not be used for non-medical purposes. Clinical consideration and professional judgment should be applied to any positive drug screen result due to possible interfering substances. A more specific alternate chemical method must be used in order to obtain a confirmed analytical result. Gas chromatography / mass spectrometry (GC/MS) is the preferred confirmat ory method. Performed at [facility], [address] [place], [place] [address]   Hemoglobin A1c     Status: Abnormal   Collection Time: 08/10/18  4:26 AM  Result Value Ref Range   Hgb A1c MFr Bld 5.8 (H) 4.8 - 5.6 %    Comment: (NOTE) Pre diabetes:          5.7%-6.4% Diabetes:              >6.4% Glycemic control for    <7.0% adults with diabetes    Mean Plasma Glucose 119.76 mg/dL    Comment: Performed at [facility] [address] [place], [place] [address]  Lipid panel     Status: Abnormal   Collection Time: 08/10/18  4:26 AM  Result Value Ref Range   Cholesterol 146 0 - 200 mg/dL   Triglycerides 343 (H) <150 mg/dL   HDL 27 (L) >40 mg/dL   Total CHOL/HDL Ratio 5.4 RATIO   VLDL 69 (H) 0 - 40 mg/dL   LDL Cholesterol 50 0 - 99 mg/dL    Comment:        Total Cholesterol/HDL:CHD Risk Coronary Heart Disease Risk Table                     Men   Women  1/2 Average Risk   3.4   3.3  Average Risk       5.0   4.4  2 X Average Risk   9.6   7.1  3 X Average Risk  23.4   11.0        Use the calculated Patient Ratio above and the CHD Risk Table to determine the patient's CHD Risk.        ATP III CLASSIFICATION (LDL):  <100     mg/dL   Optimal  100-129  mg/dL   Near or Above                    Optimal  130-159  mg/dL   Borderline  160-189  mg/dL   High  >190     mg/dL   Very High Performed at [facility], [address] [place], [place] [address]    Ct Head Martinez Contrast  Result Date: 08/09/2018 CLINICAL DATA:  Slurred speech dizziness and left arm weakness since yesterday. EXAM: CT HEAD WITHOUT CONTRAST TECHNIQUE: Contiguous axial images were obtained from the base of the skull through the vertex without intravenous contrast. COMPARISON:  11/29/2008 FINDINGS: Perry: No evidence of acute infarction, hemorrhage, hydrocephalus, extra-axial collection or mass lesion/mass effect. Vascular: No hyperdense vessel or unexpected calcification. Skull: Normal. Negative for fracture or focal lesion. Sinuses/Orbits: Globes and orbits are within normal limits. Visualized sinuses and mastoid air cells are clear. Other: None. IMPRESSION: Normal unenhanced CT scan of the Perry. Electronically Signed   By: Lajean Manes M.D.   On: 08/09/2018 12:38   Mr [place] Martinez Neck W Martinez Contrast  Result Date:  08/09/2018 CLINICAL DATA:  63 y/o M; history of atrial fibrillation. Patient presents to ED with slurred  speech, left arm weakness, numbness, difficulty walking, and incoordination. EXAM: MRI HEAD WITHOUT CONTRAST MRA NECK WITHOUT AND WITH CONTRAST TECHNIQUE: Multiplanar, multiecho pulse sequences of the Perry and surrounding structures were obtained without intravenous contrast. Angiographic images of the neck were obtained using MRA technique with and without intravenous contrast. Carotid stenosis measurements (when applicable) are obtained utilizing NASCET criteria, using the distal internal carotid diameter as the denominator. CONTRAST:  9 cc Gadavist. COMPARISON:  08/09/2018 CT head. FINDINGS: MRI HEAD FINDINGS Perry: 16 x 9 mm focus of reduced diffusion within the right paramedian pons compatible with acute/early subacute infarction (series 2, image 18). No associated hemorrhage or mass effect. No extra-axial collection, hydrocephalus, mass effect, or herniation. Small foci of susceptibility hypointensity within the left temporal lobe and the right occipital lobe may represent hemosiderin deposition of chronic microhemorrhage or possibly a tiny cavernoma. Vascular: Increased T1 signal within the wall of mid basilar artery (series 10, image 10). Skull and upper cervical spine: Normal marrow signal. Sinuses/Orbits: Left maxillary sinus mucous retention cyst. No abnormal signal of the additional included paranasal sinuses or the mastoid air cells. Orbits are unremarkable. Other: None. MRA NECK FINDINGS Aortic arch: Patent. Right common carotid artery: Patent. Right internal carotid artery: Patent. Right vertebral artery: Patent. Left common carotid artery: Patent. Left Internal carotid artery: Patent. Left Vertebral artery: Patent. There is no evidence of hemodynamically significant stenosis by NASCET criteria, occlusion, or aneurysm of the carotid or vertebral arteries in the neck. Intracranial internal carotid  arteries, proximal MCA, proximal anterior cerebral arteries, vertebrobasilar system, and proximal posterior cerebral arteries are patent. There is a short segment of moderate 50% stenosis of the mid basilar artery. There is faintly increased T1 signal within the wall of the basilar artery at the level of stenosis (series 9, image 56). IMPRESSION: 1. 16 mm acute/early subacute infarction within the right paramedian pons. No associated hemorrhage or mass effect. 2. Segment of moderate stenosis of mid basilar artery at the level of infarction with increased T1 signal in the vessel wall compatible with focal dissection. 3. Patent carotid and vertebral arteries without significant stenosis by NASCET criteria. These results will be called to the ordering clinician or representative by the Radiologist Assistant, and communication documented in the PACS or zVision Dashboard. Electronically Signed   By: Mitzi HansenLance  Furusawa-Stratton M.D.   On: 08/09/2018 23:29   Mr [place] Martinez Contrast  Result Date: 08/09/2018 CLINICAL DATA:  63 y/o M; history of atrial fibrillation. Patient presents to ED with slurred speech, left arm weakness, numbness, difficulty walking, and incoordination. EXAM: MRI HEAD WITHOUT CONTRAST MRA NECK WITHOUT AND WITH CONTRAST TECHNIQUE: Multiplanar, multiecho pulse sequences of the Perry and surrounding structures were obtained without intravenous contrast. Angiographic images of the neck were obtained using MRA technique with and without intravenous contrast. Carotid stenosis measurements (when applicable) are obtained utilizing NASCET criteria, using the distal internal carotid diameter as the denominator. CONTRAST:  9 cc Gadavist. COMPARISON:  08/09/2018 CT head. FINDINGS: MRI HEAD FINDINGS Perry: 16 x 9 mm focus of reduced diffusion within the right paramedian pons compatible with acute/early subacute infarction (series 2, image 18). No associated hemorrhage or mass effect. No extra-axial collection,  hydrocephalus, mass effect, or herniation. Small foci of susceptibility hypointensity within the left temporal lobe and the right occipital lobe may represent hemosiderin deposition of chronic microhemorrhage or possibly a tiny cavernoma. Vascular: Increased T1 signal within the wall of mid basilar artery (series 10, image 10). Skull and upper cervical spine: Normal  marrow signal. Sinuses/Orbits: Left maxillary sinus mucous retention cyst. No abnormal signal of the additional included paranasal sinuses or the mastoid air cells. Orbits are unremarkable. Other: None. MRA NECK FINDINGS Aortic arch: Patent. Right common carotid artery: Patent. Right internal carotid artery: Patent. Right vertebral artery: Patent. Left common carotid artery: Patent. Left Internal carotid artery: Patent. Left Vertebral artery: Patent. There is no evidence of hemodynamically significant stenosis by NASCET criteria, occlusion, or aneurysm of the carotid or vertebral arteries in the neck. Intracranial internal carotid arteries, proximal MCA, proximal anterior cerebral arteries, vertebrobasilar system, and proximal posterior cerebral arteries are patent. There is a short segment of moderate 50% stenosis of the mid basilar artery. There is faintly increased T1 signal within the wall of the basilar artery at the level of stenosis (series 9, image 56). IMPRESSION: 1. 16 mm acute/early subacute infarction within the right paramedian pons. No associated hemorrhage or mass effect. 2. Segment of moderate stenosis of mid basilar artery at the level of infarction with increased T1 signal in the vessel wall compatible with focal dissection. 3. Patent carotid and vertebral arteries without significant stenosis by NASCET criteria. These results will be called to the ordering clinician or representative by the Radiologist Assistant, and communication documented in the PACS or zVision Dashboard. Electronically Signed   By: Mitzi Hansen M.D.    On: 08/09/2018 23:29    Assessment:  63 y.o. male  With PMHx HTN, HLD, diagnosed with a fib 1 month ago,on elequis for 1 month, prior IVDU admitted with gait imbalance/slurred speech/left facial droop. Neuro exam with dysmetria on FN/HK on the left/leftfacial and dysarthria. MRI/MRA with 16 mm acute/early subacute infarction within the right paramedianpons. No associated hemorrhage or mass effect.Segment of moderate stenosis of mid basilar artery at the level of infarction with increased T1 signal in the vessel wall compatiblewith focal dissection  Patent carotid and vertebral arteries without significantstenosis by NASCET criteria.    Plan:  1. HgbA1c, fasting lipid panel 3. PT consult, OT consult, Speech consult 4. Echocardiogram 5- continue plavix/asa 5- continue afib meds 7. Risk factor modification 8. Telemetry monitoring 9. Frequent neuro checks and neuro protectives measures including normothermia, normoglycemia, correct electrolytes/metabolic abnilites, treat infections. 10- discussed with primary team. Appreciate cardio recs. Remaining of treatment by primary team.   [place] Martinez. MD neurology 08/10/2018, 10:20 AM

## 2018-08-10 NOTE — Progress Notes (Signed)
[facility]     PATIENT NAME: Floreen ComberDouglas Bahr    MR#:  [id number]  DATE OF BIRTH:  11/07/1955  SUBJECTIVE:   Patient presented to the hospital due to ataxic gait and balance issues and noted to have acute CVA.    NO other complaints. NO headache, Numbness, tingling.   REVIEW OF SYSTEMS:    Review of Systems  Constitutional: Negative for chills and fever.  HENT: Negative for congestion and tinnitus.   Eyes: Negative for blurred vision and double vision.  Respiratory: Negative for cough, shortness of breath and wheezing.   Cardiovascular: Negative for chest pain, orthopnea and PND.  Gastrointestinal: Negative for abdominal pain, diarrhea, nausea and vomiting.  Genitourinary: Negative for dysuria and hematuria.  Neurological: Negative for dizziness, sensory change and focal weakness.  All other systems reviewed and are negative.   Nutrition: Heart Healthy Tolerating Diet: Yes Tolerating PT: Await Eval.    DRUG ALLERGIES:  No Known Allergies  VITALS:  Blood pressure 105/85, pulse 85, temperature 97.8 F (36.6 C), temperature source Oral, resp. rate 18, height 6' (1.829 m), weight 87.9 kg, SpO2 95 %.  PHYSICAL EXAMINATION:   Physical Exam  GENERAL:  63 y.o.-year-old patient lying in bed in no acute distress.  EYES: Pupils equal, round, reactive to light and accommodation. No scleral icterus. Extraocular muscles intact.  HEENT: Head atraumatic, normocephalic. Oropharynx and nasopharynx clear.  NECK:  Supple, no jugular venous distention. No thyroid enlargement, no tenderness.  LUNGS: Normal breath sounds bilaterally, no wheezing, rales, rhonchi. No use of accessory muscles of respiration.  CARDIOVASCULAR: S1, S2 normal. No murmurs, rubs, or gallops.  ABDOMEN: Soft, nontender, nondistended. Bowel sounds present. No organomegaly or mass.  EXTREMITIES: No cyanosis, clubbing or edema b/l.    NEUROLOGIC: Cranial nerves II through XII are  intact. No focal Motor or sensory deficits b/l.   + dysmetria on left upper ext.  Abnormal finger to nose test on left UE.    PSYCHIATRIC: The patient is alert and oriented x 3.  SKIN: No obvious rash, lesion, or ulcer.    LABORATORY PANEL:   CBC Recent Labs  Lab 08/09/18 1242  WBC 12.0*  HGB 14.6  HCT 45.0  PLT 166   ------------------------------------------------------------------------------------------------------------------  Chemistries  Recent Labs  Lab 08/09/18 1242  NA 137  K 4.3  CL 101  CO2 26  GLUCOSE 120*  BUN 33*  CREATININE 1.36*  CALCIUM 9.2  AST 38  ALT 46*  ALKPHOS 97  BILITOT 0.7   ------------------------------------------------------------------------------------------------------------------  Cardiac Enzymes No results for input(s): TROPONINI in the last 168 hours. ------------------------------------------------------------------------------------------------------------------  RADIOLOGY:  Ct Head Wo Contrast  Result Date: 08/09/2018 CLINICAL DATA:  Slurred speech dizziness and left arm weakness since yesterday. EXAM: CT HEAD WITHOUT CONTRAST TECHNIQUE: Contiguous axial images were obtained from the base of the skull through the vertex without intravenous contrast. COMPARISON:  11/29/2008 FINDINGS: Brain: No evidence of acute infarction, hemorrhage, hydrocephalus, extra-axial collection or mass lesion/mass effect. Vascular: No hyperdense vessel or unexpected calcification. Skull: Normal. Negative for fracture or focal lesion. Sinuses/Orbits: Globes and orbits are within normal limits. Visualized sinuses and mastoid air cells are clear. Other: None. IMPRESSION: Normal unenhanced CT scan of the brain. Electronically Signed   By: Amie Portlandavid  Ormond M.D.   On: 08/09/2018 12:38   Mr Maxine GlennMra Neck W Wo Contrast  Result Date: 08/09/2018 CLINICAL DATA:  63 y/o M; history of atrial fibrillation. Patient presents to ED with slurred speech,  left arm weakness,  numbness, difficulty walking, and incoordination. EXAM: MRI HEAD WITHOUT CONTRAST MRA NECK WITHOUT AND WITH CONTRAST TECHNIQUE: Multiplanar, multiecho pulse sequences of the brain and surrounding structures were obtained without intravenous contrast. Angiographic images of the neck were obtained using MRA technique with and without intravenous contrast. Carotid stenosis measurements (when applicable) are obtained utilizing NASCET criteria, using the distal internal carotid diameter as the denominator. CONTRAST:  9 cc Gadavist. COMPARISON:  08/09/2018 CT head. FINDINGS: MRI HEAD FINDINGS Brain: 16 x 9 mm focus of reduced diffusion within the right paramedian pons compatible with acute/early subacute infarction (series 2, image 18). No associated hemorrhage or mass effect. No extra-axial collection, hydrocephalus, mass effect, or herniation. Small foci of susceptibility hypointensity within the left temporal lobe and the right occipital lobe may represent hemosiderin deposition of chronic microhemorrhage or possibly a tiny cavernoma. Vascular: Increased T1 signal within the wall of mid basilar artery (series 10, image 10). Skull and upper cervical spine: Normal marrow signal. Sinuses/Orbits: Left maxillary sinus mucous retention cyst. No abnormal signal of the additional included paranasal sinuses or the mastoid air cells. Orbits are unremarkable. Other: None. MRA NECK FINDINGS Aortic arch: Patent. Right common carotid artery: Patent. Right internal carotid artery: Patent. Right vertebral artery: Patent. Left common carotid artery: Patent. Left Internal carotid artery: Patent. Left Vertebral artery: Patent. There is no evidence of hemodynamically significant stenosis by NASCET criteria, occlusion, or aneurysm of the carotid or vertebral arteries in the neck. Intracranial internal carotid arteries, proximal MCA, proximal anterior cerebral arteries, vertebrobasilar system, and proximal posterior cerebral arteries are  patent. There is a short segment of moderate 50% stenosis of the mid basilar artery. There is faintly increased T1 signal within the wall of the basilar artery at the level of stenosis (series 9, image 56). IMPRESSION: 1. 16 mm acute/early subacute infarction within the right paramedian pons. No associated hemorrhage or mass effect. 2. Segment of moderate stenosis of mid basilar artery at the level of infarction with increased T1 signal in the vessel wall compatible with focal dissection. 3. Patent carotid and vertebral arteries without significant stenosis by NASCET criteria. These results will be called to the ordering clinician or representative by the Radiologist Assistant, and communication documented in the PACS or zVision Dashboard. Electronically Signed   By: Mitzi HansenLance  Furusawa-Stratton M.D.   On: 08/09/2018 23:29   Mr Brain Wo Contrast  Result Date: 08/09/2018 CLINICAL DATA:  63 y/o M; history of atrial fibrillation. Patient presents to ED with slurred speech, left arm weakness, numbness, difficulty walking, and incoordination. EXAM: MRI HEAD WITHOUT CONTRAST MRA NECK WITHOUT AND WITH CONTRAST TECHNIQUE: Multiplanar, multiecho pulse sequences of the brain and surrounding structures were obtained without intravenous contrast. Angiographic images of the neck were obtained using MRA technique with and without intravenous contrast. Carotid stenosis measurements (when applicable) are obtained utilizing NASCET criteria, using the distal internal carotid diameter as the denominator. CONTRAST:  9 cc Gadavist. COMPARISON:  08/09/2018 CT head. FINDINGS: MRI HEAD FINDINGS Brain: 16 x 9 mm focus of reduced diffusion within the right paramedian pons compatible with acute/early subacute infarction (series 2, image 18). No associated hemorrhage or mass effect. No extra-axial collection, hydrocephalus, mass effect, or herniation. Small foci of susceptibility hypointensity within the left temporal lobe and the right  occipital lobe may represent hemosiderin deposition of chronic microhemorrhage or possibly a tiny cavernoma. Vascular: Increased T1 signal within the wall of mid basilar artery (series 10, image 10). Skull and upper cervical spine: Normal  marrow signal. Sinuses/Orbits: Left maxillary sinus mucous retention cyst. No abnormal signal of the additional included paranasal sinuses or the mastoid air cells. Orbits are unremarkable. Other: None. MRA NECK FINDINGS Aortic arch: Patent. Right common carotid artery: Patent. Right internal carotid artery: Patent. Right vertebral artery: Patent. Left common carotid artery: Patent. Left Internal carotid artery: Patent. Left Vertebral artery: Patent. There is no evidence of hemodynamically significant stenosis by NASCET criteria, occlusion, or aneurysm of the carotid or vertebral arteries in the neck. Intracranial internal carotid arteries, proximal MCA, proximal anterior cerebral arteries, vertebrobasilar system, and proximal posterior cerebral arteries are patent. There is a short segment of moderate 50% stenosis of the mid basilar artery. There is faintly increased T1 signal within the wall of the basilar artery at the level of stenosis (series 9, image 56). IMPRESSION: 1. 16 mm acute/early subacute infarction within the right paramedian pons. No associated hemorrhage or mass effect. 2. Segment of moderate stenosis of mid basilar artery at the level of infarction with increased T1 signal in the vessel wall compatible with focal dissection. 3. Patent carotid and vertebral arteries without significant stenosis by NASCET criteria. These results will be called to the ordering clinician or representative by the Radiologist Assistant, and communication documented in the PACS or zVision Dashboard. Electronically Signed   By: Kristine Garbe M.D.   On: 08/09/2018 23:29     ASSESSMENT AND PLAN:   63 year old male with past medical history of hypertension, hyperlipidemia,  hypothyroidism, atrial fibrillation who presented to the hospital due to ataxic gait and balance issues and noted to have an acute CVA.  1.  Acute CVA-this is a cause of patient's abnormal gait and balance issues. -Patient's MRI was positive for 16 mm acute/early subacute infarction within the right paramedian pons. -Seen by neurology and continue aspirin, Eliquis for now. - Await PT/OT evaluation. - MRA of the neck was negative for carotid artery stenosis.  Echocardiogram pending.  2.  History of atrial fibrillation-patient remains in A. fib presently.  Rate controlled. -Continue Toprol, amiodarone. -Continue Eliquis.  3.  History of anxiety/depression-continue Valium, Lexapro.  4.  Restless leg syndrome-continue Requip.  5.  Hyperlipidemia-continue Crestor.    All the records are reviewed and case discussed with Care Management/Social Worker. Management plans discussed with the patient, family and they are in agreement.  CODE STATUS: Full code  DVT Prophylaxis: Eliquis  TOTAL TIME TAKING CARE OF THIS PATIENT: 30 minutes.   POSSIBLE D/C IN 1-2 DAYS, DEPENDING ON CLINICAL CONDITION.   Henreitta Leber M.D on 08/10/2018 at 1:14 PM  Between 7am to 6pm - Pager - [phone]  After 6pm go to [email or web address] - Proofreader  [facility]  Office  [phone]  CC: Primary care physician; Patient, No Pcp Per

## 2018-08-10 NOTE — Progress Notes (Signed)
Abnormal MRI brain findings called by the radiologist.  I have spoken with the on call Dr. Cato Mulligan with Neurosurgery regarding abnormal findings.  Neurology has been consulted already.

## 2018-08-10 NOTE — Plan of Care (Signed)
  Problem: Education: Goal: Knowledge of disease or condition will improve Outcome: Progressing Goal: Knowledge of secondary prevention will improve Outcome: Progressing Goal: Knowledge of patient specific risk factors addressed and post discharge goals established will improve Outcome: Progressing   Problem: Coping: Goal: Will verbalize positive feelings about self Outcome: Progressing Goal: Will identify appropriate support needs Outcome: Progressing   Problem: Health Behavior/Discharge Planning: Goal: Ability to manage health-related needs will improve Outcome: Progressing   Problem: Self-Care: Goal: Ability to participate in self-care as condition permits will improve Outcome: Progressing Goal: Ability to communicate needs accurately will improve Outcome: Progressing   Problem: Nutrition: Goal: Risk of aspiration will decrease Outcome: Progressing   Problem: Ischemic Stroke/TIA Tissue Perfusion: Goal: Complications of ischemic stroke/TIA will be minimized Outcome: Progressing   

## 2018-08-10 NOTE — Evaluation (Signed)
Physical Therapy Evaluation Patient Details Name: Perry Martinez MRN: [id number] DOB: September 19, 1955 Today's Date: 08/10/2018   History of Present Illness  presented to ER secondary to L UE > LE weakness and coordination deficits, slurred speech and imbalance with gait; admitted for CVA work up. MRI significant for R paramedian pons infarct.  Clinical Impression  Upon evaluation, patient alert and oriented; follows all commands and demonstrates excellent effort with all functional activities.  Patient very eager/motivated to trial OOB and participate/progress as able with functional mobility.  Mild strength deficit noted L UE/LE (4/5), moderate ataxia and dysmetria noted.  Significant difficulty with dynamic balance activities (delayed protective righting reactions, 3-5 second romberg testing, absent SLS), requiring min assist at all times to maintain balance.  Currently able to complete bed mobility indep; sit/stand, basic transfers and gait (>100') without assist device, min assist.  Demonstrates reciprocal stepping pattern with decreased L UE arm swing, inconsistent L step height/length and overall foot placement; veers L dynamic gait components, requiring min assist from therapist for balance correction at times.   Patient very willing to receive any and all education from therapist; immediately working to integrate throughout session.  Anticipate consistent progress towards all goals; patient exceptionally motivated to recover optimal functional indep to allow for return to work Doctor, general practice) Would benefit from skilled PT to address above deficits and promote optimal return to PLOF; recommend transition to acute inpatient rehab upon discharge for high-intensity, post-acute rehab services.       Follow Up Recommendations CIR    Equipment Recommendations       Recommendations for Other Services       Precautions / Restrictions Precautions Precautions: Fall Restrictions Weight Bearing  Restrictions: No      Mobility  Bed Mobility Overal bed mobility: Modified Independent                Transfers Overall transfer level: Needs assistance   Transfers: Sit to/from Stand Sit to Stand: Min guard;Min assist         General transfer comment: slightly impulsive  Ambulation/Gait Ambulation/Gait assistance: Min assist Gait Distance (Feet): (>100) Assistive device: None       General Gait Details: reciprocal stepping pattern with decreased L UE arm swing, inconsistent L step height/length and overall foot placement; veers L dynamic gait components, requiring min assist from therapist for balance correction at times  Stairs Stairs: Yes Stairs assistance: Min assist Stair Management: One rail Right Number of Stairs: 4 General stair comments: reciprocal stepping pattern; fair L LE hip/knee control, mod difficulty with L LE placement (esp when descending) requiring min assist to recover/prevent fall  Wheelchair Mobility    Modified Rankin (Stroke Patients Only)       Balance Overall balance assessment: Needs assistance Sitting-balance support: No upper extremity supported;Feet supported Sitting balance-Leahy Scale: Good     Standing balance support: No upper extremity supported Standing balance-Leahy Scale: Fair   Single Leg Stance - Right Leg: 0 Single Leg Stance - Left Leg: 0     Rhomberg - Eyes Opened: 5 Rhomberg - Eyes Closed: 3   High Level Balance Comments: delayed and minimal protective righting reactions with external perturbations             Pertinent Vitals/Pain Pain Assessment: No/denies pain    Home Living Family/patient expects to be discharged to:: Private residence Living Arrangements: Spouse/significant other Available Help at Discharge: Family;Available 24 hours/day Type of Home: House Home Access: Stairs to enter Entrance Stairs-Rails: None Entrance [employer]  of Steps: 1 Home Layout: One level Home  Equipment: None      Prior Function Level of Independence: Independent         Comments: Indep with ADLs, household and community mobilization without assist device; working full-time as Music therapistcarpenter (self-employed).  Denies fall history.     Hand Dominance   Dominant Hand: Right    Extremity/Trunk Assessment   Upper Extremity Assessment Upper Extremity Assessment: (strength grossly 4/5 L UE, mod ataxia and dysmetria, denies sensory loss; RUE grossly WFL)    Lower Extremity Assessment Lower Extremity Assessment: (L LE grossly 4/5, mild/mod ataxia, denies sensory deficit; R LE grossly WFL)       Communication   Communication: (mild dysarthria, improved with cuing for slower rate of speech and improved articulation/ennunciation)  Cognition Arousal/Alertness: Awake/alert Behavior During Therapy: WFL for tasks assessed/performed Overall Cognitive Status: Within Functional Limits for tasks assessed                                 General Comments: very eager, motivated for OOB and progression as appropriate      General Comments      Exercises Other Exercises Other Exercises: Educated on importance of slowing speed of movement and gait, minimizing distractions and dual-task activities, purposeful and intentional use of L UE/LE; patient voiced understanding, will continue to reinforce   Assessment/Plan    PT Assessment Patient needs continued PT services  PT Problem List Decreased strength;Decreased range of motion;Decreased activity tolerance;Decreased balance;Decreased mobility;Decreased coordination;Decreased knowledge of use of DME;Decreased safety awareness;Decreased knowledge of precautions       PT Treatment Interventions DME instruction;Gait training;Stair training;Functional mobility training;Therapeutic activities;Therapeutic exercise;Balance training;Neuromuscular re-education;Cognitive remediation;Patient/family education    PT Goals (Current  goals can be found in the Care Plan section)  Acute Rehab PT Goals Patient Stated Goal: to get my independence back PT Goal Formulation: With patient Time For Goal Achievement: 08/24/18 Potential to Achieve Goals: Good    Frequency 7X/week   Barriers to discharge        Co-evaluation               AM-PAC PT "6 Clicks" Mobility  Outcome Measure Help needed turning from your back to your side while in a flat bed without using bedrails?: None Help needed moving from lying on your back to sitting on the side of a flat bed without using bedrails?: None Help needed moving to and from a bed to a chair (including a wheelchair)?: A Little Help needed standing up from a chair using your arms (e.g., wheelchair or bedside chair)?: A Little Help needed to walk in hospital room?: A Little Help needed climbing 3-5 steps with a railing? : A Little 6 Click Score: 20    End of Session Equipment Utilized During Treatment: Gait belt Activity Tolerance: Patient tolerated treatment well Patient left: in chair;with chair alarm set;with call bell/phone within reach Nurse Communication: Mobility status PT Visit Diagnosis: Muscle weakness (generalized) (M62.81);Difficulty in walking, not elsewhere classified (R26.2);Hemiplegia and hemiparesis Hemiplegia - Right/Left: Left Hemiplegia - dominant/non-dominant: Non-dominant Hemiplegia - caused by: Cerebral infarction    Time: [id number] PT Time Calculation (min) (ACUTE ONLY): 24 min   Charges:   PT Evaluation $PT Eval Moderate Complexity: 1 Mod PT Treatments $Neuromuscular Re-education: 8-22 mins        Bryker Fletchall H. Manson PasseyBrown, PT, DPT, NCS 08/10/18, 3:17 PM [phone]

## 2018-08-10 NOTE — Progress Notes (Signed)
Rehab Admissions Coordinator Note:  Patient was screened by Michel Santee, PT, DPT for appropriateness for an Inpatient Acute Rehab Consult.  Note pt currently performing mobility with min assist or better, ambulating >100' without device.  Pt currently performing at too high a level for CIR, and expect he will continue to progress mobility during acute stay.  Recommend pt f/u with home health or outpatient therapies, if still appropriate, at discharge from [place], [place], DPT Admissions Coordinator [phone] 08/10/18  4:10 PM

## 2018-08-11 ENCOUNTER — Inpatient Hospital Stay
Admit: 2018-08-11 | Discharge: 2018-08-11 | Disposition: A | Discharge status: Home / Self Care | Payer: Medicare Other | Attending: Internal Medicine | Admitting: Internal Medicine

## 2018-08-11 ENCOUNTER — Inpatient Hospital Stay: Admit: 2018-08-11 | Payer: Medicare Other

## 2018-08-11 LAB — BASIC METABOLIC PANEL
Anion gap: 6 (ref 5–15)
BUN: 29 mg/dL — ABNORMAL HIGH (ref 8–23)
CO2: 28 mmol/L (ref 22–32)
Calcium: 9.1 mg/dL (ref 8.9–10.3)
Chloride: 104 mmol/L (ref 98–111)
Creatinine, Ser: 1.23 mg/dL (ref 0.61–1.24)
GFR calc Af Amer: >60 mL/min (ref >60)
GFR calc non Af Amer: >60 mL/min (ref >60)
Glucose, Bld: 101 mg/dL — ABNORMAL HIGH (ref 70–99)
Potassium: 4.9 mmol/L (ref 3.5–5.1)
Sodium: 138 mmol/L (ref 135–145)

## 2018-08-11 LAB — NOVEL CORONAVIRUS, NAA (HOSP ORDER, SEND-OUT TO REF LAB; TAT 18-24 HRS): SARS-CoV-2, NAA: NOT DETECTED

## 2018-08-11 LAB — HIV ANTIBODY (ROUTINE TESTING W REFLEX): HIV Screen 4th Generation wRfx: NONREACTIVE

## 2018-08-11 NOTE — Progress Notes (Signed)
Physical Therapy Treatment Patient Details Name: DAQUARIUS DUBEAU MRN: [id number] DOB: 05-11-1955 Today's Date: 08/11/2018    History of Present Illness presented to ER secondary to L UE > LE weakness and coordination deficits, slurred speech and imbalance with gait; admitted for CVA work up. MRI significant for R paramedian pons infarct.    PT Comments    Pt in bed, ready for session.  Mod independent bed mobility and transfers.  Pt stated he has been standing at bedside for urinal and to brush his teeth but does not feel comfortable with independent ambulation at this time.  Agreed with pt and encouraged him to continue to call for assist with gait.  Voiced understanding.  Ambulated x 1 with SPC around nursing unit.  Pt stated he did not feel as comfortable with gait as he did yesterday and was "choppy" with his steps with increased lateral sway.  Returned to room and RW was tried.  He was able to walk 2 laps around unit with overall improved gait but L knee hyperextension was noted about 75% through gait.  Returned to room to rest and he was instructed in HEP with emphasis on quad strengthening to be completed on his own several times per day.  Voiced understanding.  He then ambulated another lap around unit and L knee hyperextension was noted quicker this time about 50% around unit.  Pt is progressing well with mobility skills but does report some feelings of increased difficulty with mobility today.  Gait deficits are evident along with LLE weakness which affects balance and general mobility.  Pt is able to tolerate extended therapy sessions with excellent motivation to return to PLOF.  He has a good support system at home.  While he is able to walk further distances, L quad weakness causes hyperextension with fatigue which limits gait.  He requires further strengthening to prevent knee injury and possibly orthotic/bracing interventions if strength does not return soon.  CIR is still recommended for  discharge but if he is not deemed appropriate, follow up OP PT/OT would be beneficial.   Follow Up Recommendations  CIR     Equipment Recommendations  Rolling walker with 5" wheels    Recommendations for Other Services       Precautions / Restrictions Precautions Precautions: Fall Restrictions Weight Bearing Restrictions: No    Mobility  Bed Mobility Overal bed mobility: Independent                Transfers Overall transfer level: Modified independent   Transfers: Sit to/from Stand Sit to Stand: Modified independent (Device/Increase time)         General transfer comment: slightly impulsive  Ambulation/Gait Ambulation/Gait assistance: Min guard Gait Distance (Feet): 160 Feet Assistive device: Rolling walker (2 wheeled);Straight cane       General Gait Details: 160' SPC, 320' RW, 160' RW   Stairs             Wheelchair Mobility    Modified Rankin (Stroke Patients Only)       Balance Overall balance assessment: Independent Sitting-balance support: No upper extremity supported;Feet supported Sitting balance-Leahy Scale: Good     Standing balance support: Bilateral upper extremity supported Standing balance-Leahy Scale: Fair                              Cognition Arousal/Alertness: Awake/alert Behavior During Therapy: WFL for tasks assessed/performed Overall Cognitive Status: Within Functional Limits for tasks assessed  Exercises Other Exercises Other Exercises: HEP instruction for LLE strengthening, quad sets, SLR, heel slides, Ab/add  - encouraged to do multiple times per day    General Comments        Pertinent Vitals/Pain Pain Assessment: No/denies pain    Home Living                      Prior Function            PT Goals (current goals can now be found in the care plan section) Progress towards PT goals: Progressing toward goals     Frequency    7X/week      PT Plan Current plan remains appropriate    Co-evaluation              AM-PAC PT "6 Clicks" Mobility   Outcome Measure  Help needed turning from your back to your side while in a flat bed without using bedrails?: None Help needed moving from lying on your back to sitting on the side of a flat bed without using bedrails?: None Help needed moving to and from a bed to a chair (including a wheelchair)?: None Help needed standing up from a chair using your arms (e.g., wheelchair or bedside chair)?: None Help needed to walk in hospital room?: A Little Help needed climbing 3-5 steps with a railing? : A Little 6 Click Score: 22    End of Session Equipment Utilized During Treatment: Gait belt Activity Tolerance: Patient tolerated treatment well Patient left: in bed;with call bell/phone within reach   Hemiplegia - Right/Left: Left Hemiplegia - dominant/non-dominant: Non-dominant Hemiplegia - caused by: Cerebral infarction     Time: [id number] PT Time Calculation (min) (ACUTE ONLY): 18 min  Charges:  $Gait Training: 8-22 mins                     Danielle DessSarah Keithan Dileonardo, PTA 08/11/18, 12:45 PM

## 2018-08-11 NOTE — Progress Notes (Signed)
  Speech Language Pathology Treatment: Cognitive-Linquistic  Patient Details Name: Perry Martinez MRN: [id number] DOB: 1955/12/14 Today's Date: 08/11/2018 Time: 4356-8616 SLP Time Calculation (min) (ACUTE ONLY): 35 min  Assessment / Plan / Recommendation Clinical Impression  Pt seen for ongoing f/u for tx and education of Dysarthria. Pt resting in bed after talking on phone w/ Girlfriend. Both pt and she (per his report) stated that his speech "sounded better today". Session focused on covering Dysarthria exercises and strategies to increase articulation of speech(slow rate, increase volume, overarticulate); education on Dysarthria and other factors that can impact intelligibility during conversation such as lacking Dentition, background noise, and lying down while talking. Pt completed general articulatory speech exercises focusing on using the strategies as recommended - utilized reading of menu and automatic speech tasks. Pt was able to increase his articulatory precision using the strategies. Handouts provided on all of the above. Encouraged talking often on phone w/ others.  F/u outpatient was recommended post discharge to continue to enhance speech/articulation; pt stated he will f/u w/ his PCP next week. Contact information given. NSG to reconsult if any decline in status while admitted.     HPI HPI: Pt is a 63 y.o. male with a known history of recently diagnosed A. fib on Eliquis, hypertension, hyperlipidemia, hypothyroidism, depression and substance abuse.  The patient was diagnosed with A. fib 1 month ago and started Eliquis.  The patient presents the ED with slurred speech, left arm weakness and numbness, difficult walking and driving with coordination for a few days.  He also complains of loss of balance.  He denies any headache, dizziness or syncope.  CT head did not report CVA but since the patient has high risk and symptoms of CVA, ED physician request admission for acute CVA.       SLP  Plan  All goals met(pt to f/u w/ Outpt services as desires)       Recommendations  Diet recommendations: Regular;Thin liquid(general precautions) Medication Administration: Whole meds with liquid Supervision: Patient able to self feed                General recommendations: (followed by OT/PT) Oral Care Recommendations: Oral care BID;Patient independent with oral care Follow up Recommendations: Outpatient SLP(if he desires for Dysarthria) SLP Visit Diagnosis: Dysarthria and anarthria (R47.1) Plan: All goals met(pt to f/u w/ Outpt services as desires)       [place], MS, CCC-SLP Perry Martinez 08/11/2018, 11:40 AM

## 2018-08-11 NOTE — Progress Notes (Signed)
Occupational Therapy Treatment Patient Details Name: Perry Martinez MRN: [id number] DOB: 17-Nov-1955 Today's Date: 08/11/2018    History of present illness presented to ER secondary to L UE > LE weakness and coordination deficits, slurred speech and imbalance with gait; admitted for CVA work up. MRI significant for R paramedian pons infarct.   OT comments  Pt progressing toward stated goals, eager to work with OT this date. Pt does endorse feeling "down" in regards to his situation, OT provided education and psychosocial support in terms of coping and pt very receptive. Focused session on LUE [facility] exercises. Pt continues to c/o dysmetria symptoms in LUE. [facility] administered with detail description below. Clothes pins and cards used to help simulate [facility] movements. Deficits noted in translation, in hand manipulation, precision, and overall coordination. Additionally, had pt complete functional mobility a with cup of water, focusing on not spilling what was inside (lid was provided for safety). Pt shows difficulty stabilizing LUE when completing functional mobility. Administered [facility] exercise sheet for pt to continue implementing in home environment. Per chart review, CIR denied for pt being high level. At this time recommend outpatient OT at d/c to help maximize functional independence and focus on LUE coordination and [facility]. Recommended 3:1 in event pt needing assist to maintain safe balance in shower. Will continue to follow per POC listed below.   Follow Up Recommendations  Outpatient OT;Supervision - Intermittent    Equipment Recommendations  3 in 1 bedside commode    Recommendations for Other Services      Precautions / Restrictions Precautions Precautions: Fall Restrictions Weight Bearing Restrictions: No       Mobility Bed Mobility Overal bed mobility: Independent                Transfers Overall transfer level: Modified independent Equipment used: None Transfers: Sit to/from  Stand Sit to Stand: Modified independent (Device/Increase time)         General transfer comment: slightly impulsive    Balance Overall balance assessment: Independent Sitting-balance support: No upper extremity supported;Feet supported Sitting balance-Leahy Scale: Good     Standing balance support: Bilateral upper extremity supported Standing balance-Leahy Scale: Fair                             ADL either performed or assessed with clinical judgement   ADL Overall ADL's : Needs assistance/impaired                                     Functional mobility during ADLs: Min guard General ADL Comments: focused session on [facility] and exercise for LUE     Vision Patient Visual Report: No change from baseline     Perception     Praxis      Cognition Arousal/Alertness: Awake/alert Behavior During Therapy: WFL for tasks assessed/performed Overall Cognitive Status: Within Functional Limits for tasks assessed                                          Exercises Hand Exercises Digit Composite Flexion: AROM;10 reps;Seated Digit Lifts: AROM;10 reps;Seated Opposition: AROM;5 reps;Left;Seated Hand Activities Cards - Flip: Both;10 reps;Seated Cards - Hand Shuffle: Both;10 reps;Seated Open and Close Containers: Left;5 reps;Seated Other Exercises Other Exercises: HEP instruction for LLE strengthening, quad sets,  SLR, heel slides, Ab/add  - encouraged to do multiple times per day   Shoulder Instructions       General Comments      Pertinent Vitals/ Pain       Pain Assessment: No/denies pain  Home Living                                          Prior Functioning/Environment              Frequency  Min 2X/week        Progress Toward Goals  OT Goals(current goals can now be found in the care plan section)  Progress towards OT goals: Progressing toward goals  Acute Rehab OT Goals Patient Stated Goal:  to get back to PLOF OT Goal Formulation: With patient Time For Goal Achievement: 08/24/18 Potential to Achieve Goals: Good  Plan Discharge plan needs to be updated    Co-evaluation                 AM-PAC OT "6 Clicks" Daily Activity     Outcome Measure   Help from another person eating meals?: A Little Help from another person taking care of personal grooming?: A Little Help from another person toileting, which includes using toliet, bedpan, or urinal?: A Little Help from another person bathing (including washing, rinsing, drying)?: A Little Help from another person to put on and taking off regular upper body clothing?: A Little Help from another person to put on and taking off regular lower body clothing?: A Little 6 Click Score: 18    End of Session Equipment Utilized During Treatment: Gait belt  OT Visit Diagnosis: Other abnormalities of gait and mobility (R26.89);Hemiplegia and hemiparesis Hemiplegia - Right/Left: Left Hemiplegia - dominant/non-dominant: Non-Dominant Hemiplegia - caused by: Cerebral infarction   Activity Tolerance Patient tolerated treatment well   Patient Left in bed;with call bell/phone within reach   Nurse Communication          Time: [id number] OT Time Calculation (min): 41 min  Charges: OT General Charges $OT Visit: 1 Visit OT Treatments $Self Care/Home Management : 38-52 mins  Dalphine HandingKaylee Kaisha Wachob, MSOT, OTR/L Behavioral Health OT/ Acute Relief OT [facility] [phone]  Dalphine HandingKaylee Matsue Strom 08/11/2018, 3:16 PM

## 2018-08-11 NOTE — Progress Notes (Signed)
[employer] [facility] [place] [facility]     PATIENT NAME: Perry Martinez    MR#:  [id number]  DATE OF BIRTH:  12/25/1955  SUBJECTIVE:   Patient presented to the hospital due to ataxic gait and balance issues and noted to have acute CVA.    NO other complaints. NO headache, Numbness, tingling.  No difficulty in swallowing.  REVIEW OF SYSTEMS:    Review of Systems  Constitutional: Negative for chills and fever.  HENT: Negative for congestion and tinnitus.   Eyes: Negative for blurred vision and double vision.  Respiratory: Negative for cough, shortness of breath and wheezing.   Cardiovascular: Negative for chest pain, orthopnea and PND.  Gastrointestinal: Negative for abdominal pain, diarrhea, nausea and vomiting.  Genitourinary: Negative for dysuria and hematuria.  Neurological: Negative for dizziness, sensory change and focal weakness.  All other systems reviewed and are negative.   Nutrition: Heart Healthy Tolerating Diet: Yes Tolerating PT: Await Eval.    DRUG ALLERGIES:  No Known Allergies  VITALS:  Blood pressure 98/73, pulse 87, temperature (!) 97.5 F (36.4 C), temperature source Oral, resp. rate 18, height 6' (1.829 m), weight 87.9 kg, SpO2 98 %.  PHYSICAL EXAMINATION:   Physical Exam  GENERAL:  63 y.o.-year-old patient lying in bed in no acute distress.  EYES: Pupils equal, round, reactive to light and accommodation. No scleral icterus. Extraocular muscles intact.  HEENT: Head atraumatic, normocephalic. Oropharynx and nasopharynx clear.  NECK:  Supple, no jugular venous distention. No thyroid enlargement, no tenderness.  LUNGS: Normal breath sounds bilaterally, no wheezing, rales, rhonchi. No use of accessory muscles of respiration.  CARDIOVASCULAR: S1, S2 normal. No murmurs, rubs, or gallops.  ABDOMEN: Soft, nontender, nondistended. Bowel sounds present. No organomegaly or mass.  EXTREMITIES: No cyanosis, clubbing or edema b/l.    NEUROLOGIC:  Cranial nerves II through XII are intact. No focal Motor or sensory deficits b/l.   + dysmetria on left upper ext.  Abnormal finger to nose test on left UE.    PSYCHIATRIC: The patient is alert and oriented x 3.  SKIN: No obvious rash, lesion, or ulcer.    LABORATORY PANEL:   CBC Recent Labs  Lab 08/09/18 1242  WBC 12.0*  HGB 14.6  HCT 45.0  PLT 166   ------------------------------------------------------------------------------------------------------------------  Chemistries  Recent Labs  Lab 08/09/18 1242 08/11/18 0423  NA 137 138  K 4.3 4.9  CL 101 104  CO2 26 28  GLUCOSE 120* 101*  BUN 33* 29*  CREATININE 1.36* 1.23  CALCIUM 9.2 9.1  AST 38  --   ALT 46*  --   ALKPHOS 97  --   BILITOT 0.7  --    ------------------------------------------------------------------------------------------------------------------  Cardiac Enzymes No results for input(s): TROPONINI in the last 168 hours. ------------------------------------------------------------------------------------------------------------------  RADIOLOGY:  Mr Maxine GlennMra Neck W Wo Contrast  Result Date: 08/09/2018 CLINICAL DATA:  63 y/o M; history of atrial fibrillation. Patient presents to ED with slurred speech, left arm weakness, numbness, difficulty walking, and incoordination. EXAM: MRI HEAD WITHOUT CONTRAST MRA NECK WITHOUT AND WITH CONTRAST TECHNIQUE: Multiplanar, multiecho pulse sequences of the brain and surrounding structures were obtained without intravenous contrast. Angiographic images of the neck were obtained using MRA technique with and without intravenous contrast. Carotid stenosis measurements (when applicable) are obtained utilizing NASCET criteria, using the distal internal carotid diameter as the denominator. CONTRAST:  9 cc Gadavist. COMPARISON:  08/09/2018 CT head. FINDINGS: MRI HEAD FINDINGS Brain: 16 x 9 mm focus of reduced  diffusion within the right paramedian pons compatible with acute/early  subacute infarction (series 2, image 18). No associated hemorrhage or mass effect. No extra-axial collection, hydrocephalus, mass effect, or herniation. Small foci of susceptibility hypointensity within the left temporal lobe and the right occipital lobe may represent hemosiderin deposition of chronic microhemorrhage or possibly a tiny cavernoma. Vascular: Increased T1 signal within the wall of mid basilar artery (series 10, image 10). Skull and upper cervical spine: Normal marrow signal. Sinuses/Orbits: Left maxillary sinus mucous retention cyst. No abnormal signal of the additional included paranasal sinuses or the mastoid air cells. Orbits are unremarkable. Other: None. MRA NECK FINDINGS Aortic arch: Patent. Right common carotid artery: Patent. Right internal carotid artery: Patent. Right vertebral artery: Patent. Left common carotid artery: Patent. Left Internal carotid artery: Patent. Left Vertebral artery: Patent. There is no evidence of hemodynamically significant stenosis by NASCET criteria, occlusion, or aneurysm of the carotid or vertebral arteries in the neck. Intracranial internal carotid arteries, proximal MCA, proximal anterior cerebral arteries, vertebrobasilar system, and proximal posterior cerebral arteries are patent. There is a short segment of moderate 50% stenosis of the mid basilar artery. There is faintly increased T1 signal within the wall of the basilar artery at the level of stenosis (series 9, image 56). IMPRESSION: 1. 16 mm acute/early subacute infarction within the right paramedian pons. No associated hemorrhage or mass effect. 2. Segment of moderate stenosis of mid basilar artery at the level of infarction with increased T1 signal in the vessel wall compatible with focal dissection. 3. Patent carotid and vertebral arteries without significant stenosis by NASCET criteria. These results will be called to the ordering clinician or representative by the Radiologist Assistant, and  communication documented in the PACS or zVision Dashboard. Electronically Signed   By: Kristine Garbe M.D.   On: 08/09/2018 23:29   Mr Brain Wo Contrast  Result Date: 08/09/2018 CLINICAL DATA:  63 y/o M; history of atrial fibrillation. Patient presents to ED with slurred speech, left arm weakness, numbness, difficulty walking, and incoordination. EXAM: MRI HEAD WITHOUT CONTRAST MRA NECK WITHOUT AND WITH CONTRAST TECHNIQUE: Multiplanar, multiecho pulse sequences of the brain and surrounding structures were obtained without intravenous contrast. Angiographic images of the neck were obtained using MRA technique with and without intravenous contrast. Carotid stenosis measurements (when applicable) are obtained utilizing NASCET criteria, using the distal internal carotid diameter as the denominator. CONTRAST:  9 cc Gadavist. COMPARISON:  08/09/2018 CT head. FINDINGS: MRI HEAD FINDINGS Brain: 16 x 9 mm focus of reduced diffusion within the right paramedian pons compatible with acute/early subacute infarction (series 2, image 18). No associated hemorrhage or mass effect. No extra-axial collection, hydrocephalus, mass effect, or herniation. Small foci of susceptibility hypointensity within the left temporal lobe and the right occipital lobe may represent hemosiderin deposition of chronic microhemorrhage or possibly a tiny cavernoma. Vascular: Increased T1 signal within the wall of mid basilar artery (series 10, image 10). Skull and upper cervical spine: Normal marrow signal. Sinuses/Orbits: Left maxillary sinus mucous retention cyst. No abnormal signal of the additional included paranasal sinuses or the mastoid air cells. Orbits are unremarkable. Other: None. MRA NECK FINDINGS Aortic arch: Patent. Right common carotid artery: Patent. Right internal carotid artery: Patent. Right vertebral artery: Patent. Left common carotid artery: Patent. Left Internal carotid artery: Patent. Left Vertebral artery: Patent.  There is no evidence of hemodynamically significant stenosis by NASCET criteria, occlusion, or aneurysm of the carotid or vertebral arteries in the neck. Intracranial internal carotid arteries, proximal MCA,  proximal anterior cerebral arteries, vertebrobasilar system, and proximal posterior cerebral arteries are patent. There is a short segment of moderate 50% stenosis of the mid basilar artery. There is faintly increased T1 signal within the wall of the basilar artery at the level of stenosis (series 9, image 56). IMPRESSION: 1. 16 mm acute/early subacute infarction within the right paramedian pons. No associated hemorrhage or mass effect. 2. Segment of moderate stenosis of mid basilar artery at the level of infarction with increased T1 signal in the vessel wall compatible with focal dissection. 3. Patent carotid and vertebral arteries without significant stenosis by NASCET criteria. These results will be called to the ordering clinician or representative by the Radiologist Assistant, and communication documented in the PACS or zVision Dashboard. Electronically Signed   By: Mitzi HansenLance  Furusawa-Stratton M.D.   On: 08/09/2018 23:29     ASSESSMENT AND PLAN:   [age] year old male with past medical history of hypertension, hyperlipidemia, hypothyroidism, atrial fibrillation who presented to the hospital due to ataxic gait and balance issues and noted to have an acute CVA.  1.  Acute CVA-this is a cause of patient's abnormal gait and balance issues. -Patient's MRI was positive for 16 mm acute/early subacute infarction within the right paramedian pons. -Seen by neurology and continue aspirin, Eliquis for now. - Appreciate PT/OT evaluation.  Right now advising to continue inpatient rehab. - MRA of the neck was negative for carotid artery stenosis.  Echocardiogram pending.  As per patient there is one echocardiogram done by Dr. Welton FlakesKhan in the last 2 weeks in clinic. -Social worker need to help arrange for inpatient rehab  or if patient improves in the next 1 to 2 days then may be able to discharge home with home health.  2.  History of atrial fibrillation-patient remains in A. fib presently.  Rate controlled. -Continue Toprol, amiodarone. -Continue Eliquis.  3.  History of anxiety/depression-continue Valium, Lexapro.  4.  Restless leg syndrome-continue Requip.  5.  Hyperlipidemia-continue Crestor.    All the records are reviewed and case discussed with Care Management/Social Worker. Management plans discussed with the patient, family and they are in agreement.  CODE STATUS: Full code  DVT Prophylaxis: Eliquis  TOTAL TIME TAKING CARE OF THIS PATIENT: 32 minutes.   POSSIBLE D/C IN 1-2 DAYS, DEPENDING ON CLINICAL CONDITION.   Altamese DillingVaibhavkumar Hubbert Landrigan M.D on 08/11/2018 at 2:50 PM  Between 7am to 6pm - Pager - [phone]  After 6pm go to [email or web address] - Social research officer, [email or web address] [email or web address]  [facility] [place] [facility]  Office  [phone]  CC: Primary care physician; Patient, No Pcp Per

## 2018-08-12 LAB — ECHOCARDIOGRAM COMPLETE
Height: 72 in
Weight: 3099.2 oz

## 2018-08-12 MED ORDER — NICOTINE 14 MG/24HR TD PT24: 14.0000 mg | MEDICATED_PATCH | Freq: Every day | TRANSDERMAL | 0 refills | Status: DC

## 2018-08-12 NOTE — TOC Transition Note (Signed)
Transition of Care [facility]) - CM/SW Discharge Note   Patient Details  Name: Perry Martinez MRN: [id number] Date of Birth: February 28, 1955  Transition of Care [facility]) CM/SW Contact:  Ross Ludwig, LCSW Phone Number: 08/12/2018, 3:24 PM   Clinical Narrative:     Patient stated he wanted to return back home with home health PT.  CSW contacted [facility], and spoke to [place], she has accepted patient referral.  CSW spoke with patient and his significant other Perry Martinez, and informed them that home health will see patient.    Final next level of care: [place] Barriers to Discharge: No Barriers Identified   Patient Goals and CMS Choice Patient states their goals for this hospitalization and ongoing recovery are:: To return back home with significant other and have home health PT. CMS Medicare.gov Compare Post Acute Care list provided to:: Patient Choice offered to / list presented to : Patient  Discharge Placement     Patient to discharge home with home health PT.                  Discharge Plan and Services                DME Arranged: N/A DME Agency: NA       HH Arranged: PT HH Agency: [facility] Date [place] Agency Contacted: 08/12/18 Time [place]: 6301 Representative spoke with at [place]: [place]  Social Determinants of Health ([place]) Interventions     Readmission Risk Interventions No flowsheet data found.

## 2018-08-12 NOTE — Progress Notes (Addendum)
Physical Therapy Treatment Patient Details Name: Perry Martinez MRN: [id number] DOB: 10/13/1955 Today's Date: 08/12/2018    History of Present Illness presented to ER secondary to L UE > LE weakness and coordination deficits, slurred speech and imbalance with gait; admitted for CVA work up. MRI significant for R paramedian pons infarct.    PT Comments    Split session 8:30-8:44 and 9:12-9:28 to allow for more intense sessions and recovery times.  Reports doing his HEP.  Stood and was able to walk 2 laps with walker and min a +1.  1 LOB requiring mod a to correct and several minor imbalances through out session requiring min A or verbal cues to correct.  Higher level balance and ex at rail in hallway.  Standing ex  - heel raises, SLR, marches, hamstring curls and hip ext LLE along with side stepping along rail.  LE fatigued and buckling noted with gait back to room.  Pt left to rest and eat breakfast.  Returned after and he ambulated on unit with walker with emphasis on turns left and right - has more difficulty to right - backwards gait and head turns.  All activities to challenge balance and to make deficits aware to pt.  Gait quality also decreased with talking or higher level cognitive tasks during gait.  He remains at an increased fall risk.  Stressed importance of general safety with mobility, slowing down movements, awareness of fatigue and need for +1 assist with gait.  Lives with wife.  Voices understanding.  Has been denied CIR.  Pt wishes to return home and feels OP PT and OT would be more beneficial than HHPT.     Follow Up Recommendations  CIR  CIR is still recommended but since denies, pt would like OP PT and OT upon discharge home.     Equipment Recommendations  Rolling walker with 5" wheels    Recommendations for Other Services       Precautions / Restrictions Precautions Precautions: Fall Restrictions Weight Bearing Restrictions: No    Mobility  Bed Mobility Overal  bed mobility: Independent                Transfers Overall transfer level: Modified independent                  Ambulation/Gait Ambulation/Gait assistance: Min guard;Min assist Gait Distance (Feet): 300 Feet Assistive device: Rolling walker (2 wheeled) Gait Pattern/deviations: Step-through pattern;Decreased stance time - left     General Gait Details: in hallway with walker with emphasis on balance, turns and overall safety   Stairs             Wheelchair Mobility    Modified Rankin (Stroke Patients Only)       Balance Overall balance assessment: Needs assistance Sitting-balance support: Feet supported Sitting balance-Leahy Scale: Good Sitting balance - Comments: steady   Standing balance support: Bilateral upper extremity supported Standing balance-Leahy Scale: Fair Standing balance comment: unsteady at times especially when fatigued.  education and awareness stressed                            Cognition Arousal/Alertness: Awake/alert Behavior During Therapy: WFL for tasks assessed/performed Overall Cognitive Status: Within Functional Limits for tasks assessed  Exercises Other Exercises Other Exercises: high level balance and exercise    General Comments        Pertinent Vitals/Pain Pain Assessment: No/denies pain    Home Living                      Prior Function            PT Goals (current goals can now be found in the care plan section) Progress towards PT goals: Goals downgraded-see care plan    Frequency    7X/week      PT Plan Current plan remains appropriate    Co-evaluation              AM-PAC PT "6 Clicks" Mobility   Outcome Measure  Help needed turning from your back to your side while in a flat bed without using bedrails?: None Help needed moving from lying on your back to sitting on the side of a flat bed without using  bedrails?: None Help needed moving to and from a bed to a chair (including a wheelchair)?: None Help needed standing up from a chair using your arms (e.g., wheelchair or bedside chair)?: None Help needed to walk in hospital room?: A Little Help needed climbing 3-5 steps with a railing? : A Little 6 Click Score: 22    End of Session Equipment Utilized During Treatment: Gait belt Activity Tolerance: Patient tolerated treatment well Patient left: in bed;with call bell/phone within reach Nurse Communication: Mobility status Hemiplegia - Right/Left: Left Hemiplegia - dominant/non-dominant: Non-dominant Hemiplegia - caused by: Cerebral infarction     Time: 1194-1740 PT Time Calculation (min) (ACUTE ONLY): 30 min  Charges:  $Gait Training: 8-22 mins $Therapeutic Exercise: 8-22 mins                     Chesley Noon, PTA 08/12/18, 9:44 AM

## 2018-08-12 NOTE — Discharge Summary (Signed)
[facility] [place] [facility]   PATIENT NAME: Perry Martinez    MR#:  [id number]  DATE OF BIRTH:  11/24/1955  DATE OF ADMISSION:  08/09/2018 ADMITTING PHYSICIAN: Shaune PollackQing Chen, MD  DATE OF DISCHARGE: 08/12/2018   PRIMARY CARE PHYSICIAN: Patient, No Pcp Per    ADMISSION DIAGNOSIS:  Cerebrovascular accident (CVA) due to embolism of other cerebral artery (HCC) [I63.49]  DISCHARGE DIAGNOSIS:  Active Problems:   Acute CVA (cerebrovascular accident) (HCC)   SECONDARY DIAGNOSIS:   Past Medical History:  Diagnosis Date  . Acute CVA (cerebrovascular accident) (HCC) 08/09/2018  . Atrial fibrillation (HCC)   . Depression   . Hyperlipidemia   . Hypertension   . Hypothyroidism 06/28/2016  . Substance abuse (HCC)    IV drugs when younger    HOSPITAL COURSE:   63 year old male with past medical history of hypertension, hyperlipidemia, hypothyroidism, atrial fibrillation who presented to the hospital due to ataxic gait and balance issues and noted to have an acute CVA.  1.  Acute CVA-this is a cause of patient's abnormal gait and balance issues. -Patient's MRI was positive for 16 mm acute/early subacute infarction within the right paramedian pons. -Seen by neurology and continue aspirin, Eliquis for now. - Appreciate PT/OT evaluation.  Right now advising to continue inpatient rehab. - MRA of the neck was negative for carotid artery stenosis.  Echocardiogram pending.  As per patient there is one echocardiogram done by Dr. Welton FlakesKhan in the last 2 weeks in clinic. - pt had some improvement in balance as as PT suggest to CIR, pt requested to go home, so we are discharging with home health PT. - On MRA there wa some concern for disection, but pt was reviewed by neurologist and no immediate interventions needed.  - Follow with Neuro clinic.  2.  History of atrial fibrillation-patient remains in A. fib presently.  Rate controlled. -Continue Toprol, amiodarone. -Continue  Eliquis.  3.  History of anxiety/depression-continue Valium, Lexapro.  4.  Restless leg syndrome-continue Requip.  5.  Hyperlipidemia-continue Crestor.   DISCHARGE CONDITIONS:   Stable.  CONSULTS OBTAINED:  Treatment Team:  Marita SnellenGuerch, Meziane, MD Thana Farreynolds, Leslie, MD  DRUG ALLERGIES:  No Known Allergies  DISCHARGE MEDICATIONS:   Allergies as of 08/12/2018   No Known Allergies     Medication List    TAKE these medications   amiodarone 200 MG tablet Commonly known as: PACERONE Take 200 mg by mouth 2 (two) times daily.   apixaban 5 MG Tabs tablet Commonly known as: Eliquis Take 1 tablet (5 mg total) by mouth 2 (two) times daily.   aspirin 81 MG chewable tablet Chew 81 mg by mouth daily.   diazepam 5 MG tablet Commonly known as: VALIUM Take 5 mg by mouth 3 (three) times daily as needed.   escitalopram 10 MG tablet Commonly known as: LEXAPRO TAKE 1 TABLET BY MOUTH EVERY DAY IN THE MORNING   furosemide 20 MG tablet Commonly known as: LASIX Take 20 mg by mouth daily.   ibuprofen 200 MG tablet Commonly known as: ADVIL Take 200 mg by mouth every 6 (six) hours as needed.   metoprolol succinate 50 MG 24 hr tablet Commonly known as: Toprol XL Take 1 tablet (50 mg total) by mouth daily. Take with or immediately following a meal.   nicotine 14 mg/24hr patch Commonly known as: NICODERM CQ - dosed in mg/24 hours Place 1 patch (14 mg total) onto the skin daily. Start taking on: August 13, 2018  rOPINIRole 0.25 MG tablet Commonly known as: REQUIP USE AS DIRECTED TAKE ONE TAB EVERY NIGHT FOR 2 NIGHTS THEN 2 TABS EVERY NIGHT   rosuvastatin 40 MG tablet Commonly known as: CRESTOR Take 40 mg by mouth daily.            Durable Medical Equipment  (From admission, onward)         Start     Ordered   08/12/18 1223  For home use only DME Dan HumphreysWalker  [facility](Walkers)  Once    Question:  Patient needs a walker to treat with the following condition  Answer:  Stroke [facility](HCC)    08/12/18 1222           DISCHARGE INSTRUCTIONS:    Follow with Neuro and Cardio clinic.  If you experience worsening of your admission symptoms, develop shortness of breath, life threatening emergency, suicidal or homicidal thoughts you must seek medical attention immediately by calling 911 or calling your MD immediately  if symptoms less severe.  You Must read complete instructions/literature along with all the possible adverse reactions/side effects for all the Medicines you take and that have been prescribed to you. Take any new Medicines after you have completely understood and accept all the possible adverse reactions/side effects.   Please note  You were cared for by a hospitalist during your hospital stay. If you have any questions about your discharge medications or the care you received while you were in the hospital after you are discharged, you can call the unit and asked to speak with the hospitalist on call if the hospitalist that took care of you is not available. Once you are discharged, your primary care physician will handle any further medical issues. Please note that NO REFILLS for any discharge medications will be authorized once you are discharged, as it is imperative that you return to your primary care physician (or establish a relationship with a primary care physician if you do not have one) for your aftercare needs so that they can reassess your need for medications and monitor your lab values.    Today   CHIEF COMPLAINT:   Chief Complaint  Patient presents with  . Cerebrovascular Accident    HISTORY OF PRESENT ILLNESS:  Perry Martinez  is a 63 y.o. male with a known history of recently diagnosed A. fib on Eliquis, hypertension, hyperlipidemia, hypothyroidism, depression and substance abuse.  The patient was diagnosed with A. fib 1 month ago and started Eliquis.  The patient presents the ED with slurred speech, left arm weakness and numbness, difficult  walking and driving with coordination for a few days.  He also complains of loss of balance.  He denies any headache, dizziness or syncope.  CT head did not report CVA but since the patient has high risk and symptoms of CVA, ED physician request admission for acute CVA.   VITAL SIGNS:  Blood pressure 120/79, pulse 70, temperature (!) 97.4 F (36.3 C), temperature source Oral, resp. rate 15, height 6' (1.829 m), weight 87.9 kg, SpO2 98 %.  I/O:    Intake/Output Summary (Last 24 hours) at 08/12/2018 1224 Last data filed at 08/12/2018 1017 Gross per 24 hour  Intake 960 ml  Output 1800 ml  Net -840 ml    PHYSICAL EXAMINATION:   GENERAL:  63 y.o.-year-old patient lying in bed in no acute distress.  EYES: Pupils equal, round, reactive to light and accommodation. No scleral icterus. Extraocular muscles intact.  HEENT: Head atraumatic, normocephalic. Oropharynx and nasopharynx  clear.  NECK:  Supple, no jugular venous distention. No thyroid enlargement, no tenderness.  LUNGS: Normal breath sounds bilaterally, no wheezing, rales, rhonchi. No use of accessory muscles of respiration.  CARDIOVASCULAR: S1, S2 normal. No murmurs, rubs, or gallops.  ABDOMEN: Soft, nontender, nondistended. Bowel sounds present. No organomegaly or mass.  EXTREMITIES: No cyanosis, clubbing or edema b/l.    NEUROLOGIC: Cranial nerves II through XII are intact. No focal Motor or sensory deficits b/l.   + dysmetria on left upper ext.  Abnormal finger to nose test on left UE.    PSYCHIATRIC: The patient is alert and oriented x 3.  SKIN: No obvious rash, lesion, or ulcer.   DATA REVIEW:   CBC Recent Labs  Lab 08/09/18 1242  WBC 12.0*  HGB 14.6  HCT 45.0  PLT 166    Chemistries  Recent Labs  Lab 08/09/18 1242 08/11/18 0423  NA 137 138  K 4.3 4.9  CL 101 104  CO2 26 28  GLUCOSE 120* 101*  BUN 33* 29*  CREATININE 1.36* 1.23  CALCIUM 9.2 9.1  AST 38  --   ALT 46*  --   ALKPHOS 97  --   BILITOT 0.7  --      Cardiac Enzymes No results for input(s): TROPONINI in the last 168 hours.  Microbiology Results  Results for orders placed or performed during the hospital encounter of 08/09/18  Novel Coronavirus,NAA,(SEND-OUT TO REF LAB - TAT 24-48 hrs); Hosp Order     Status: None   Collection Time: 08/09/18  8:10 PM   Specimen: Nasopharyngeal Swab; Respiratory  Result Value Ref Range Status   SARS-CoV-2, NAA NOT DETECTED NOT DETECTED Final    Comment: (NOTE) This test was developed and its performance characteristics determined by [employer]. This test has not been FDA cleared or approved. This test has been authorized by FDA under an Emergency Use Authorization (EUA). This test is only authorized for the duration of time the declaration that circumstances exist justifying the authorization of the emergency use of in vitro diagnostic tests for detection of SARS-CoV-2 virus and/or diagnosis of COVID-19 infection under section 564(b)(1) of the Act, 21 U.S.C. 063KZS-0(F)(0), unless the authorization is terminated or revoked sooner. When diagnostic testing is negative, the possibility of a false negative result should be considered in the context of a patient's recent exposures and the presence of clinical signs and symptoms consistent with COVID-19. An individual without symptoms of COVID-19 and who is not shedding SARS-CoV-2 virus would expect to have a negative (not detected) result in this assay. Performed  At: [facility] [place], [place] [id number] Rush Farmer MD KG:[id number]    [place]  Final    Comment: Performed at [facility], [address] [place], [place] [address]    RADIOLOGY:  No results found.  EKG:   Orders placed or performed during the hospital encounter of 08/09/18  . ED EKG  . ED EKG  . EKG      Management plans discussed with the patient, family and they are in agreement.  CODE  STATUS:     Code Status Orders  (From admission, onward)         Start     Ordered   08/09/18 2112  Full code  Continuous     08/09/18 2114        Code Status History    This patient has a current code status but no historical code status.  Advance Care Planning Activity      TOTAL TIME TAKING CARE OF THIS PATIENT: 35 minutes.    Altamese DillingVaibhavkumar Alasia Enge M.D on 08/12/2018 at 12:24 PM  Between 7am to 6pm - Pager - [phone]  After 6pm go to [email or web address] - password [email or web address]  [facility] [place] [facility]  Office  [phone]  CC: Primary care physician; Patient, No Pcp Per   Note: This dictation was prepared with Dragon dictation along with smaller phrase technology. Any transcriptional errors that result from this process are unintentional.

## 2018-08-12 NOTE — Progress Notes (Signed)
Pt d/c to home with girlfriend. IV removed intact. VSS. Education completed. Belongings sent with pt. All questions answered.

## 2018-08-13 ENCOUNTER — Other Ambulatory Visit: Payer: Self-pay | Admitting: Cardiovascular Disease

## 2018-08-14 ENCOUNTER — Other Ambulatory Visit: Payer: Self-pay

## 2018-08-14 ENCOUNTER — Other Ambulatory Visit
Admission: RE | Admit: 2018-08-14 | Discharge: 2018-08-14 | Disposition: A | Discharge status: Home / Self Care | Payer: Medicare Other | Source: Ambulatory Visit | Attending: Cardiovascular Disease | Admitting: Cardiovascular Disease

## 2018-08-14 DIAGNOSIS — Z1159 Encounter for screening for other viral diseases: Secondary | ICD-10-CM | POA: Insufficient documentation

## 2018-08-16 ENCOUNTER — Ambulatory Visit
Admission: RE | Admit: 2018-08-16 | Discharge: 2018-08-16 | Disposition: A | Discharge status: Home / Self Care | Payer: Medicare Other | Attending: Cardiovascular Disease | Admitting: Cardiovascular Disease

## 2018-08-16 ENCOUNTER — Other Ambulatory Visit: Payer: Self-pay

## 2018-08-16 ENCOUNTER — Encounter
Admission: RE | Disposition: A | Discharge status: Home / Self Care | Payer: Self-pay | Source: Home / Self Care | Attending: Cardiovascular Disease

## 2018-08-16 DIAGNOSIS — Z79899 Other long term (current) drug therapy: Secondary | ICD-10-CM | POA: Diagnosis not present

## 2018-08-16 DIAGNOSIS — I4891 Unspecified atrial fibrillation: Secondary | ICD-10-CM | POA: Insufficient documentation

## 2018-08-16 DIAGNOSIS — F17210 Nicotine dependence, cigarettes, uncomplicated: Secondary | ICD-10-CM | POA: Diagnosis not present

## 2018-08-16 DIAGNOSIS — Z5309 Procedure and treatment not carried out because of other contraindication: Secondary | ICD-10-CM | POA: Diagnosis not present

## 2018-08-16 DIAGNOSIS — E782 Mixed hyperlipidemia: Secondary | ICD-10-CM | POA: Insufficient documentation

## 2018-08-16 DIAGNOSIS — Z7901 Long term (current) use of anticoagulants: Secondary | ICD-10-CM | POA: Insufficient documentation

## 2018-08-16 DIAGNOSIS — Z8673 Personal history of transient ischemic attack (TIA), and cerebral infarction without residual deficits: Secondary | ICD-10-CM | POA: Insufficient documentation

## 2018-08-16 DIAGNOSIS — G4733 Obstructive sleep apnea (adult) (pediatric): Secondary | ICD-10-CM | POA: Diagnosis not present

## 2018-08-16 LAB — NOVEL CORONAVIRUS, NAA (HOSP ORDER, SEND-OUT TO REF LAB; TAT 18-24 HRS): SARS-CoV-2, NAA: NOT DETECTED

## 2018-08-16 SURGERY — ECHOCARDIOGRAM, TRANSESOPHAGEAL
Anesthesia: General

## 2018-08-16 NOTE — OR Nursing (Signed)
Pt arricved for TEE, he said he was supposed to be here for TEE/CV, Dr Humphrey Rolls informed. However after 12 obtained noted pt in sinus bradycardia. Procedure cancelled. Pt informed to go see Dr Humphrey Rolls in am at 31.

## 2018-08-20 ENCOUNTER — Other Ambulatory Visit: Payer: Self-pay | Admitting: Cardiovascular Disease

## 2018-08-21 ENCOUNTER — Other Ambulatory Visit: Payer: Self-pay | Admitting: Cardiovascular Disease

## 2018-08-21 DIAGNOSIS — I513 Intracardiac thrombosis, not elsewhere classified: Secondary | ICD-10-CM

## 2018-08-29 ENCOUNTER — Ambulatory Visit: Admission: RE | Admit: 2018-08-29 | Payer: Medicare Other | Source: Ambulatory Visit

## 2018-09-11 ENCOUNTER — Other Ambulatory Visit: Payer: Self-pay

## 2018-09-11 ENCOUNTER — Ambulatory Visit
Admission: RE | Admit: 2018-09-11 | Discharge: 2018-09-11 | Disposition: A | Discharge status: Home / Self Care | Payer: Medicare Other | Source: Ambulatory Visit | Attending: Cardiovascular Disease | Admitting: Cardiovascular Disease

## 2018-09-11 DIAGNOSIS — I350 Nonrheumatic aortic (valve) stenosis: Secondary | ICD-10-CM | POA: Diagnosis not present

## 2018-09-11 DIAGNOSIS — I4891 Unspecified atrial fibrillation: Secondary | ICD-10-CM | POA: Diagnosis not present

## 2018-09-11 DIAGNOSIS — I10 Essential (primary) hypertension: Secondary | ICD-10-CM | POA: Diagnosis not present

## 2018-09-11 DIAGNOSIS — E785 Hyperlipidemia, unspecified: Secondary | ICD-10-CM | POA: Insufficient documentation

## 2018-09-11 DIAGNOSIS — I513 Intracardiac thrombosis, not elsewhere classified: Secondary | ICD-10-CM | POA: Insufficient documentation

## 2018-09-11 DIAGNOSIS — Z8673 Personal history of transient ischemic attack (TIA), and cerebral infarction without residual deficits: Secondary | ICD-10-CM | POA: Insufficient documentation

## 2018-09-11 MED ORDER — PERFLUTREN LIPID MICROSPHERE
1.0000 mL | INTRAVENOUS | Status: AC | PRN
  Administered 2018-09-11: 2 mL via INTRAVENOUS
  Filled 2018-09-11: qty 10

## 2018-09-11 NOTE — Progress Notes (Signed)
*  PRELIMINARY RESULTS* Echocardiogram 2D Echocardiogram has been performed.  Perry Martinez 09/11/2018, 11:09 AM

## 2019-11-08 ENCOUNTER — Other Ambulatory Visit: Payer: Self-pay

## 2019-11-08 ENCOUNTER — Ambulatory Visit
Admission: RE | Admit: 2019-11-08 | Discharge: 2019-11-08 | Disposition: A | Discharge status: Home / Self Care | Payer: Medicare Other | Source: Ambulatory Visit | Attending: Internal Medicine | Admitting: Internal Medicine

## 2019-11-08 ENCOUNTER — Other Ambulatory Visit: Payer: Self-pay | Admitting: Internal Medicine

## 2019-11-08 DIAGNOSIS — M79662 Pain in left lower leg: Secondary | ICD-10-CM | POA: Insufficient documentation

## 2019-11-08 DIAGNOSIS — M7989 Other specified soft tissue disorders: Secondary | ICD-10-CM | POA: Diagnosis present

## 2020-04-09 ENCOUNTER — Encounter: Payer: Self-pay | Admitting: Internal Medicine

## 2020-04-09 ENCOUNTER — Other Ambulatory Visit: Payer: Self-pay

## 2020-04-09 ENCOUNTER — Inpatient Hospital Stay
Admission: EM | Admit: 2020-04-09 | Discharge: 2020-04-12 | DRG: 378 | Disposition: A | Discharge status: Home / Self Care | Payer: Medicare Other | Attending: Internal Medicine | Admitting: Internal Medicine

## 2020-04-09 DIAGNOSIS — I130 Hypertensive heart and chronic kidney disease with heart failure and stage 1 through stage 4 chronic kidney disease, or unspecified chronic kidney disease: Secondary | ICD-10-CM | POA: Diagnosis present

## 2020-04-09 DIAGNOSIS — N1831 Chronic kidney disease, stage 3a: Secondary | ICD-10-CM | POA: Diagnosis present

## 2020-04-09 DIAGNOSIS — K640 First degree hemorrhoids: Secondary | ICD-10-CM | POA: Diagnosis present

## 2020-04-09 DIAGNOSIS — N179 Acute kidney failure, unspecified: Secondary | ICD-10-CM

## 2020-04-09 DIAGNOSIS — Z7901 Long term (current) use of anticoagulants: Secondary | ICD-10-CM

## 2020-04-09 DIAGNOSIS — I480 Paroxysmal atrial fibrillation: Secondary | ICD-10-CM | POA: Diagnosis present

## 2020-04-09 DIAGNOSIS — K5730 Diverticulosis of large intestine without perforation or abscess without bleeding: Secondary | ICD-10-CM | POA: Diagnosis present

## 2020-04-09 DIAGNOSIS — I4891 Unspecified atrial fibrillation: Secondary | ICD-10-CM | POA: Diagnosis present

## 2020-04-09 DIAGNOSIS — Z720 Tobacco use: Secondary | ICD-10-CM | POA: Diagnosis present

## 2020-04-09 DIAGNOSIS — E039 Hypothyroidism, unspecified: Secondary | ICD-10-CM | POA: Diagnosis present

## 2020-04-09 DIAGNOSIS — D62 Acute posthemorrhagic anemia: Secondary | ICD-10-CM | POA: Diagnosis not present

## 2020-04-09 DIAGNOSIS — Z8673 Personal history of transient ischemic attack (TIA), and cerebral infarction without residual deficits: Secondary | ICD-10-CM

## 2020-04-09 DIAGNOSIS — F17210 Nicotine dependence, cigarettes, uncomplicated: Secondary | ICD-10-CM | POA: Diagnosis present

## 2020-04-09 DIAGNOSIS — F419 Anxiety disorder, unspecified: Secondary | ICD-10-CM | POA: Diagnosis present

## 2020-04-09 DIAGNOSIS — K922 Gastrointestinal hemorrhage, unspecified: Principal | ICD-10-CM | POA: Diagnosis present

## 2020-04-09 DIAGNOSIS — K635 Polyp of colon: Secondary | ICD-10-CM | POA: Diagnosis present

## 2020-04-09 DIAGNOSIS — I5032 Chronic diastolic (congestive) heart failure: Secondary | ICD-10-CM | POA: Diagnosis present

## 2020-04-09 DIAGNOSIS — E785 Hyperlipidemia, unspecified: Secondary | ICD-10-CM | POA: Diagnosis present

## 2020-04-09 DIAGNOSIS — Z79899 Other long term (current) drug therapy: Secondary | ICD-10-CM

## 2020-04-09 DIAGNOSIS — R195 Other fecal abnormalities: Secondary | ICD-10-CM

## 2020-04-09 DIAGNOSIS — K2970 Gastritis, unspecified, without bleeding: Secondary | ICD-10-CM | POA: Diagnosis present

## 2020-04-09 DIAGNOSIS — G2581 Restless legs syndrome: Secondary | ICD-10-CM | POA: Diagnosis present

## 2020-04-09 DIAGNOSIS — Z20822 Contact with and (suspected) exposure to covid-19: Secondary | ICD-10-CM | POA: Diagnosis present

## 2020-04-09 DIAGNOSIS — D649 Anemia, unspecified: Secondary | ICD-10-CM

## 2020-04-09 HISTORY — DX: Acute kidney failure, unspecified: N17.9

## 2020-04-09 HISTORY — DX: Acute posthemorrhagic anemia: D62

## 2020-04-09 LAB — COMPREHENSIVE METABOLIC PANEL
ALT: 15 U/L (ref 0–44)
AST: 26 U/L (ref 15–41)
Albumin: 4.1 g/dL (ref 3.5–5.0)
Alkaline Phosphatase: 71 U/L (ref 38–126)
Anion gap: 9 (ref 5–15)
BUN: 25 mg/dL — ABNORMAL HIGH (ref 8–23)
CO2: 20 mmol/L — ABNORMAL LOW (ref 22–32)
Calcium: 8.6 mg/dL — ABNORMAL LOW (ref 8.9–10.3)
Chloride: 106 mmol/L (ref 98–111)
Creatinine, Ser: 1.57 mg/dL — ABNORMAL HIGH (ref 0.61–1.24)
GFR, Estimated: 48 mL/min — ABNORMAL LOW (ref >60)
Glucose, Bld: 115 mg/dL — ABNORMAL HIGH (ref 70–99)
Potassium: 4.6 mmol/L (ref 3.5–5.1)
Sodium: 135 mmol/L (ref 135–145)
Total Bilirubin: 0.5 mg/dL (ref 0.3–1.2)
Total Protein: 7.5 g/dL (ref 6.5–8.1)

## 2020-04-09 LAB — CBC
HCT: 20.1 % — ABNORMAL LOW (ref 39.0–52.0)
Hemoglobin: 5.3 g/dL — ABNORMAL LOW (ref 13.0–17.0)
MCH: 18 pg — ABNORMAL LOW (ref 26.0–34.0)
MCHC: 26.4 g/dL — ABNORMAL LOW (ref 30.0–36.0)
MCV: 68.1 fL — ABNORMAL LOW (ref 80.0–100.0)
Platelets: 186 10*3/uL (ref 150–400)
RBC: 2.95 MIL/uL — ABNORMAL LOW (ref 4.22–5.81)
RDW: 19.1 % — ABNORMAL HIGH (ref 11.5–15.5)
WBC: 4.7 10*3/uL (ref 4.0–10.5)
nRBC: 0 % (ref 0.0–0.2)

## 2020-04-09 LAB — PROTIME-INR
INR: 1.6 — ABNORMAL HIGH (ref 0.8–1.2)
Prothrombin Time: 18.3 seconds — ABNORMAL HIGH (ref 11.4–15.2)

## 2020-04-09 LAB — IRON AND TIBC
Iron: 17 ug/dL — ABNORMAL LOW (ref 45–182)
Saturation Ratios: 3 % — ABNORMAL LOW (ref 17.9–39.5)
TIBC: 615 ug/dL — ABNORMAL HIGH (ref 250–450)
UIBC: 598 ug/dL

## 2020-04-09 LAB — RETICULOCYTES
Immature Retic Fract: 23 % — ABNORMAL HIGH (ref 2.3–15.9)
RBC.: 2.84 MIL/uL — ABNORMAL LOW (ref 4.22–5.81)
Retic Count, Absolute: 78.4 10*3/uL (ref 19.0–186.0)
Retic Ct Pct: 2.8 % (ref 0.4–3.1)

## 2020-04-09 LAB — MAGNESIUM: Magnesium: 2.2 mg/dL (ref 1.7–2.4)

## 2020-04-09 LAB — FOLATE: Folate: 9.3 ng/mL (ref >5.9)

## 2020-04-09 LAB — FERRITIN: Ferritin: 5 ng/mL — ABNORMAL LOW (ref 24–336)

## 2020-04-09 LAB — PREPARE RBC (CROSSMATCH)

## 2020-04-09 LAB — RESP PANEL BY RT-PCR (FLU A&B, COVID) ARPGX2
Influenza A by PCR: NEGATIVE
Influenza B by PCR: NEGATIVE
SARS Coronavirus 2 by RT PCR: NEGATIVE

## 2020-04-09 LAB — ABO/RH: ABO/RH(D): O NEG

## 2020-04-09 MED ORDER — ROSUVASTATIN CALCIUM 10 MG PO TABS
40.0000 mg | ORAL_TABLET | Freq: Every day | ORAL | Status: DC
  Administered 2020-04-09 – 2020-04-12 (×4): 40 mg via ORAL
  Filled 2020-04-09 (×4): qty 4

## 2020-04-09 MED ORDER — SODIUM CHLORIDE 0.9 % IV SOLN
10.0000 mL/h | Freq: Once | INTRAVENOUS | Status: AC
  Administered 2020-04-09: 10 mL/h via INTRAVENOUS

## 2020-04-09 MED ORDER — PANTOPRAZOLE SODIUM 40 MG IV SOLR
40.0000 mg | Freq: Two times a day (BID) | INTRAVENOUS | Status: DC
  Administered 2020-04-09 – 2020-04-12 (×6): 40 mg via INTRAVENOUS
  Filled 2020-04-09 (×6): qty 40

## 2020-04-09 MED ORDER — ACETAMINOPHEN 650 MG RE SUPP: 650.0000 mg | Freq: Four times a day (QID) | RECTAL | Status: DC | PRN

## 2020-04-09 MED ORDER — ONDANSETRON HCL 4 MG/2ML IJ SOLN: 4.0000 mg | Freq: Four times a day (QID) | INTRAMUSCULAR | Status: DC | PRN

## 2020-04-09 MED ORDER — ACETAMINOPHEN 325 MG PO TABS
650.0000 mg | ORAL_TABLET | Freq: Four times a day (QID) | ORAL | Status: DC | PRN
  Administered 2020-04-10 – 2020-04-12 (×3): 650 mg via ORAL
  Filled 2020-04-09 (×3): qty 2

## 2020-04-09 MED ORDER — ROPINIROLE HCL 1 MG PO TABS
0.5000 mg | ORAL_TABLET | Freq: Every day | ORAL | Status: DC
  Administered 2020-04-09 – 2020-04-11 (×3): 0.5 mg via ORAL
  Filled 2020-04-09 (×3): qty 1

## 2020-04-09 MED ORDER — DIAZEPAM 5 MG PO TABS
10.0000 mg | ORAL_TABLET | Freq: Every evening | ORAL | Status: DC | PRN
  Administered 2020-04-09: 10 mg via ORAL
  Filled 2020-04-09: qty 2

## 2020-04-09 MED ORDER — AMIODARONE HCL 200 MG PO TABS
200.0000 mg | ORAL_TABLET | Freq: Every day | ORAL | Status: DC
  Administered 2020-04-11 – 2020-04-12 (×2): 200 mg via ORAL
  Filled 2020-04-09 (×3): qty 1

## 2020-04-09 NOTE — ED Provider Notes (Signed)
[facility] Emergency Department Provider Note  ____________________________________________  Time seen: Approximately 3:18 PM  I have reviewed the triage vital signs and the nursing notes.   HISTORY  Chief Complaint Abnormal Lab (Pt with hgb of 5.5 from a blood draw yesterday at [facility])    HPI Perry Martinez is a 65 y.o. male with a history of stroke, atrial fibrillation on Eliquis, hypertension who comes the ED complaining of dizziness with standing and severe fatigue, gradual onset and worsening over the past 2 or 3 months.  He has been seeing his PCP, noting declining hemoglobin level, but had negative Hemoccult testing in the clinic.  His PCP has been recommending outpatient colonoscopy.  No syncope, no falls, no trauma.  Patient denies a history of GI bleeding or peptic ulcer disease, no recent heavy steroid or NSAID use.   Patient last took his Eliquis this morning.   Past Medical History:  Diagnosis Date  . Acute CVA (cerebrovascular accident) (HCC) 08/09/2018  . Atrial fibrillation (HCC)   . Depression   . Hyperlipidemia   . Hypertension   . Hypothyroidism 06/28/2016  . Substance abuse (HCC)    IV drugs when younger     Patient Active Problem List   Diagnosis Date Noted  . Acute upper GI bleed 04/09/2020  . Acute CVA (cerebrovascular accident) (HCC) 08/09/2018  . Hypothyroidism 06/28/2016  . Vitamin D deficiency 06/28/2016  . Tobacco abuse 06/27/2016  . Hyperlipidemia 06/27/2016  . Bilateral leg cramps 06/27/2016  . Elevated blood pressure, situational 06/27/2016     Past Surgical History:  Procedure Laterality Date  . FRACTURE SURGERY     right foot  . ROTATOR CUFF REPAIR     right shoulder     Prior to Admission medications   Medication Sig Start Date End Date Taking? Authorizing Provider  amiodarone (PACERONE) 200 MG tablet Take 200 mg by mouth daily.   Yes [provider]  apixaban (ELIQUIS) 5 MG TABS  tablet Take 1 tablet (5 mg total) by mouth 2 (two) times daily. 07/04/18  Yes Paduchowski, Caryn Bee, MD  diazepam (VALIUM) 10 MG tablet Take 10 mg by mouth at bedtime. 07/17/18  Yes [provider]  furosemide (LASIX) 20 MG tablet Take 20 mg by mouth daily as needed. 07/26/18  Yes [provider]  gabapentin (NEURONTIN) 300 MG capsule Take 300 mg by mouth See admin instructions. Take 1 in the morning, 1 in evening and 3 at bedtime 02/24/20  Yes [provider]  metoprolol succinate (TOPROL XL) 50 MG 24 hr tablet Take 1 tablet (50 mg total) by mouth daily. Take with or immediately following a meal. 07/04/18 07/04/19 Yes Paduchowski, Caryn Bee, MD  rOPINIRole (REQUIP) 0.25 MG tablet Take 0.5 mg by mouth at bedtime. 07/17/18  Yes [provider]  rosuvastatin (CRESTOR) 40 MG tablet Take 40 mg by mouth daily. 07/06/18  Yes [provider]     Allergies Patient has no known allergies.   Family History  Problem Relation Age of Onset  . Heart disease Mother        dieed at 55  . Arthritis Mother   . Diabetes Mother   . Heart disease Father 45       bypass  . Arthritis Father   . Hyperlipidemia Father   . Hypertension Father   . Rheumatic fever Father     Social History Social History   Tobacco Use  . Smoking status: Current Every Day Smoker  Packs/day: 0.50    Years: 50.00    Pack years: 25.00    Types: Cigarettes    Start date: 06/28/1966  . Smokeless tobacco: Never Used  . Tobacco comment: previously smoked 1ppd  Vaping Use  . Vaping Use: Never used  Substance Use Topics  . Alcohol use: No  . Drug use: No    Review of Systems  Constitutional:   No fever or chills.  ENT:   No sore throat. No rhinorrhea. Cardiovascular:   No chest pain or syncope. Respiratory:   No dyspnea or cough. Gastrointestinal:   Negative for abdominal pain, vomiting and diarrhea.  Musculoskeletal:   Negative for focal pain or swelling All other systems reviewed and  are negative except as documented above in ROS and HPI.  ____________________________________________   PHYSICAL EXAM:  VITAL SIGNS: ED Triage Vitals  Enc Vitals Group     BP 04/09/20 1223 (!) 148/86     Pulse Rate 04/09/20 1223 71     Resp 04/09/20 1223 (!) 22     Temp 04/09/20 1223 98.1 F (36.7 C)     Temp Source 04/09/20 1223 Oral     SpO2 04/09/20 1223 98 %     Weight 04/09/20 1224 195 lb (88.5 kg)     Height 04/09/20 1224 6' (1.829 m)     Head Circumference --      Peak Flow --      Pain Score 04/09/20 1224 0     Pain Loc --      Pain Edu? --      Excl. in GC? --     Vital signs reviewed, nursing assessments reviewed.   Constitutional:   Alert and oriented. Non-toxic appearance. Eyes:   Conjunctivae are normal. EOMI. PERRL. ENT      Head:   Normocephalic and atraumatic.      Nose:   Wearing a mask.      Mouth/Throat:   Wearing a mask.      Neck:   No meningismus. Full ROM. Hematological/Lymphatic/Immunilogical:   No cervical lymphadenopathy. Cardiovascular:   RRR. Symmetric bilateral radial and DP pulses.  No murmurs. Cap refill less than 2 seconds. Respiratory:   Normal respiratory effort without tachypnea/retractions. Breath sounds are clear and equal bilaterally. No wheezes/rales/rhonchi. Gastrointestinal:   Soft and nontender. Non distended. There is no CVA tenderness.  No rebound, rigidity, or guarding.  Rectal exam shows brown stool, Hemoccult positive.  No significant hemorrhoids  Musculoskeletal:   Normal range of motion in all extremities. No joint effusions.  No lower extremity tenderness.  No edema. Neurologic:   Normal speech and language.  Motor grossly intact. No acute focal neurologic deficits are appreciated.  Skin:    Skin is warm, dry and intact. No rash noted.  No petechiae, purpura, or bullae.  ____________________________________________    LABS (pertinent positives/negatives) (all labs ordered are listed, but only abnormal results are  displayed) Labs Reviewed  COMPREHENSIVE METABOLIC PANEL - Abnormal; Notable for the following components:      Result Value   CO2 20 (*)    Glucose, Bld 115 (*)    BUN 25 (*)    Creatinine, Ser 1.57 (*)    Calcium 8.6 (*)    GFR, Estimated 48 (*)    All other components within normal limits  CBC - Abnormal; Notable for the following components:   RBC 2.95 (*)    Hemoglobin 5.3 (*)    HCT 20.1 (*)    MCV  68.1 (*)    MCH 18.0 (*)    MCHC 26.4 (*)    RDW 19.1 (*)    All other components within normal limits  PROTIME-INR - Abnormal; Notable for the following components:   Prothrombin Time 18.3 (*)    INR 1.6 (*)    All other components within normal limits  RESP PANEL BY RT-PCR (FLU A&B, COVID) ARPGX2  POC OCCULT BLOOD, ED  TYPE AND SCREEN  ABO/RH  PREPARE RBC (CROSSMATCH)   ____________________________________________   EKG  Interpreted by me  Date: 04/09/2020  Rate: 71  Rhythm: normal sinus rhythm  QRS Axis: normal  Intervals: normal  ST/T Wave abnormalities: normal  Conduction Disutrbances: none  Narrative Interpretation: unremarkable      ____________________________________________    RADIOLOGY  No results found.  ____________________________________________   PROCEDURES .Critical Care Performed by: Sharman Cheek, MD Authorized by: Sharman Cheek, MD   Critical care provider statement:    Critical care time (minutes):  33   Critical care time was exclusive of:  Separately billable procedures and treating other patients   Critical care was necessary to treat or prevent imminent or life-threatening deterioration of the following conditions:  Circulatory failure   Critical care was time spent personally by me on the following activities:  Development of treatment plan with patient or surrogate, discussions with consultants, evaluation of patient's response to treatment, examination of patient, obtaining history from patient or surrogate,  ordering and performing treatments and interventions, ordering and review of laboratory studies, ordering and review of radiographic studies, pulse oximetry, re-evaluation of patient's condition and review of old charts    ____________________________________________    CLINICAL IMPRESSION / ASSESSMENT AND PLAN / ED COURSE  Medications ordered in the ED: Medications  0.9 %  sodium chloride infusion (has no administration in time range)    Pertinent labs & imaging results that were available during my care of the patient were reviewed by me and considered in my medical decision making (see chart for details).  Perry Martinez was evaluated in Emergency Department on 04/09/2020 for the symptoms described in the history of present illness. He was evaluated in the context of the global COVID-19 pandemic, which necessitated consideration that the patient might be at risk for infection with the SARS-CoV-2 virus that causes COVID-19. Institutional protocols and algorithms that pertain to the evaluation of patients at risk for COVID-19 are in a state of rapid change based on information released by regulatory bodies including the CDC and federal and state organizations. These policies and algorithms were followed during the patient's care in the ED.   Patient presents with worsening fatigue, evidence of occult GI bleed on exam.  Vital signs are normal, but hemoglobin is 5 compared to a baseline of 15.  No signs of shock currently, but will need to transfuse, plan to admit for further management      ____________________________________________   FINAL CLINICAL IMPRESSION(S) / ED DIAGNOSES    Final diagnoses:  Symptomatic anemia  Occult GI bleeding     ED Discharge Orders    None      Portions of this note were generated with dragon dictation software. Dictation errors may occur despite best attempts at proofreading.   Sharman Cheek, MD 04/09/20 [phone]

## 2020-04-09 NOTE — ED Notes (Signed)
After first 15 min of transfusion start pt denies any change in condition at this time. VSS.

## 2020-04-09 NOTE — H&P (Signed)
History and Physical    PLEASE NOTE THAT DRAGON DICTATION SOFTWARE WAS USED IN THE CONSTRUCTION OF THIS NOTE.   Perry Martinez MVH:[id number] DOB: [birth year] DOA: 04/09/2020  PCP: Sherron Monday, MD Patient coming from: home   I have personally briefly reviewed patient's old medical records in [facility]  Chief Complaint: Dark-colored stool  HPI: Perry Martinez is a 65 y.o. male with medical history significant for paroxysmal atrial fibrillation chronically anticoagulated on Eliquis, restless leg syndrome, chronic diastolic heart failure, who is admitted to [facility] on 04/09/2020 with suspected subacute upper gastrointestinal bleed after presenting from home to [facility] ED complaining of 3 months of dark-colored stools.   The patient reports dark-colored stools associated with over half of his bowel movements over the course of the last 3 months in the absence of any associated hematochezia.  Denies any associated abdominal pain, nausea, vomiting, or hematemesis.  Denies any preceding trauma.  He also denies any associated chest pain, palpitations, diaphoresis.  Over the last month, report generalized weakness and fatigue, and also mild shortness of breath with exertion over that time, which is new for him.  Denies any associated presyncope, or syncope.  Denies any associated subjective fever, chills, rigors, generalized myalgias, rash.  No prior history of suspected gastrointestinal bleed.  The patient reports that he has never previously undergone any endoscopic evaluation, but reports that he is scheduled for routine screening colonoscopy in April 2022.  Denies any recent unintentional weight loss.   The patient denies any regular or recent consumption of alcohol, and denies any known history of underlying liver disease.  Denies any use of NSAIDs.  In the context of paroxysmal atrial fibrillation, the patient confirms that he is chronically anticoagulated on this, with most recent dose  occurring this morning (04/09/20).  Otherwise, he denies taking any blood thinning agents at home, including no aspirin.  He also denies taking any of the following oral medications at home: Systemic steroids, supplemental potassium, or supplemental iron.  He reports that globin has been trended recently by [place] PCP, the qualitatively a slow decline over the last 2 to 3 months, although I have been unable to this report per chart review, which revealed most recent prior hemoglobin value of 14.6 in June 2020.  In terms of the factors that led the patient to present to the emergency department today for further evaluation of a 64-month course of dark-colored stool, the patient conveys that the generalized weakness and fatigue that he has been experiencing over the course the last month has progressed to the point where it is affecting the quality of his life and limiting his functional status on this basis.  He specifically denies any new symptoms over the last few days, rather emphasizing frustration over the gradual progression of his generalized weakness and fatigue over the last month.  Medical history is also notable for a documented history of chronic diastolic heart failure, with most recent echocardiogram in June 2020 showing mildly dilated left ventricle, severe LVH, LVEF 50 to 55%, impaired relaxation consistent with diastolic dysfunction, mildly dilated left atrium, and no evidence of significant valvular pathology.  He is prescribed as needed Lasix as an outpatient, which the patient reports that he has not recently been taking.  Additionally, in the setting of paroxysmal atrial fibrillation, a rhythm control strategy is pursued as an outpatient, with the patient on daily oral amiodarone.  Not on a beta-blocker as an outpatient.  Denies any recent headache, neck stiffness,  rhinitis, rhinorrhea, sore throat, wheezing, cough. No recent traveling or known COVID-19 exposures. Denies dysuria, gross hematuria,  or change in urinary urgency/frequency.      ED Course:  Vital signs in the ED were notable for the following: Tetramex 98.6, heart rate 6066, blood pressure 116/76 - 130/92, respiratory rate 16-20, oxygen saturation 97 to 100% on room air.  Labs were notable for the following: CMP was notable for the following: Sodium 135, bicarbonate 20, anion gap 9, BUN 25, which is down from most recent prior value of 29 in June 2020, creatinine 1.57 relative to baseline range of 1.2-1.3, with most recent prior value noted to be 1.23 in June 2020.  CBC notable for white blood cell count of 4700, hemoglobin 5.3 relative to most recent prior value of 14.6 in June 2020, platelets 26.  Today's presenting hemoglobin value is associated with microcytic, hypochromic findings, with an elevated RDW of 19.1.  Screening nasopharyngeal COVID-19 PCR was performed in the ED today, and found to be negative.  EKG showed normal sinus rhythm with heart rate 71, no evidence of T wave or ST changes, including no evidence of ST elevation.   DRE in the ED revealed dark-colored stool found to be guaiac positive.  While in the ED, the patient was typed and screened for 2 units PRBC, with transfusion of first unit initiated while still in the ED.    Review of Systems: As per HPI otherwise 10 point review of systems negative.   Past Medical History:  Diagnosis Date  . Acute CVA (cerebrovascular accident) (HCC) 08/09/2018  . Atrial fibrillation (HCC)   . Depression   . Hyperlipidemia   . Hypertension   . Hypothyroidism 06/28/2016  . Substance abuse (HCC)    IV drugs when younger    Past Surgical History:  Procedure Laterality Date  . FRACTURE SURGERY     right foot  . ROTATOR CUFF REPAIR     right shoulder    Social History:  reports that he has been smoking cigarettes. He started smoking about 53 years ago. He has a 25.00 pack-year smoking history. He has never used smokeless tobacco. He reports that he does not drink  alcohol and does not use drugs.   No Known Allergies  Family History  Problem Relation Age of Onset  . Heart disease Mother        dieed at 2883  . Arthritis Mother   . Diabetes Mother   . Heart disease Father 45       bypass  . Arthritis Father   . Hyperlipidemia Father   . Hypertension Father   . Rheumatic fever Father      Prior to Admission medications   Medication Sig Start Date End Date Taking? Authorizing Provider  amiodarone (PACERONE) 200 MG tablet Take 200 mg by mouth daily.   Yes [provider]  apixaban (ELIQUIS) 5 MG TABS tablet Take 1 tablet (5 mg total) by mouth 2 (two) times daily. 07/04/18  Yes Paduchowski, Caryn BeeKevin, MD  diazepam (VALIUM) 10 MG tablet Take 10 mg by mouth at bedtime. 07/17/18  Yes [provider]  furosemide (LASIX) 20 MG tablet Take 20 mg by mouth daily as needed. 07/26/18  Yes [provider]  gabapentin (NEURONTIN) 300 MG capsule Take 300 mg by mouth See admin instructions. Take 1 in the morning, 1 in evening and 3 at bedtime 02/24/20  Yes [provider]  metoprolol succinate (TOPROL XL) 50 MG 24 hr tablet Take  1 tablet (50 mg total) by mouth daily. Take with or immediately following a meal. 07/04/18 07/04/19 Yes Paduchowski, Caryn Bee, MD  rOPINIRole (REQUIP) 0.25 MG tablet Take 0.5 mg by mouth at bedtime. 07/17/18  Yes [provider]  rosuvastatin (CRESTOR) 40 MG tablet Take 40 mg by mouth daily. 07/06/18  Yes [provider]     Objective    Physical Exam: Vitals:   04/09/20 1615 04/09/20 1630 04/09/20 1718 04/09/20 1737  BP: 122/83 (!) 130/92  139/82  Pulse: 62 (!) 53  (!) 59  Resp: 20 (!) 22  16  Temp: 98.3 F (36.8 C) 98.3 F (36.8 C) 98.6 F (37 C) 98.3 F (36.8 C)  TempSrc: Oral  Oral Oral  SpO2: 98% 97%  100%  Weight:      Height:        General: appears to be stated age; alert, oriented Skin: warm, dry, no rash Head:  AT/[place] Mouth:  Oral mucosa membranes appear moist, normal  dentition Neck: supple; trachea midline Heart:  RRR; did not appreciate any M/R/G Lungs: CTAB, did not appreciate any wheezes, rales, or rhonchi Abdomen: + BS; soft, ND, NT Vascular: 2+ pedal pulses b/l; 2+ radial pulses b/l Extremities: no peripheral edema, no muscle wasting Neuro: strength and sensation intact in upper and lower extremities b/l    Labs on Admission: I have personally reviewed following labs and imaging studies  CBC: Recent Labs  Lab 04/09/20 1229  WBC 4.7  HGB 5.3*  HCT 20.1*  MCV 68.1*  PLT 186   Basic Metabolic Panel: Recent Labs  Lab 04/09/20 1229  NA 135  K 4.6  CL 106  CO2 20*  GLUCOSE 115*  BUN 25*  CREATININE 1.57*  CALCIUM 8.6*   GFR: Estimated Creatinine Clearance: 50.8 mL/min (A) (by C-G formula based on SCr of 1.57 mg/dL (H)). Liver Function Tests: Recent Labs  Lab 04/09/20 1229  AST 26  ALT 15  ALKPHOS 71  BILITOT 0.5  PROT 7.5  ALBUMIN 4.1   No results for input(s): LIPASE, AMYLASE in the last 168 hours. No results for input(s): AMMONIA in the last 168 hours. Coagulation Profile: Recent Labs  Lab 04/09/20 1229  INR 1.6*   Cardiac Enzymes: No results for input(s): CKTOTAL, CKMB, CKMBINDEX, TROPONINI in the last 168 hours. BNP (last 3 results) No results for input(s): PROBNP in the last 8760 hours. HbA1C: No results for input(s): HGBA1C in the last 72 hours. CBG: No results for input(s): GLUCAP in the last 168 hours. Lipid Profile: No results for input(s): CHOL, HDL, LDLCALC, TRIG, CHOLHDL, LDLDIRECT in the last 72 hours. Thyroid Function Tests: No results for input(s): TSH, T4TOTAL, FREET4, T3FREE, THYROIDAB in the last 72 hours. Anemia Panel: No results for input(s): VITAMINB12, FOLATE, FERRITIN, TIBC, IRON, RETICCTPCT in the last 72 hours. Urine analysis:    Component Value Date/Time   COLORURINE YELLOW (A) 08/09/2018 1907   APPEARANCEUR CLEAR (A) 08/09/2018 1907   LABSPEC 1.018 08/09/2018 1907   PHURINE 6.0  08/09/2018 1907   GLUCOSEU NEGATIVE 08/09/2018 1907   HGBUR NEGATIVE 08/09/2018 1907   BILIRUBINUR NEGATIVE 08/09/2018 1907   KETONESUR NEGATIVE 08/09/2018 1907   PROTEINUR NEGATIVE 08/09/2018 1907   NITRITE NEGATIVE 08/09/2018 1907   LEUKOCYTESUR NEGATIVE 08/09/2018 1907    Radiological Exams on Admission: No results found.   EKG: Independently reviewed, with result as described above.    Assessment/Plan   VELTON ROSELLE is a 65 y.o. male with medical history significant for  paroxysmal atrial fibrillation chronically anticoagulated on Eliquis, restless leg syndrome, chronic diastolic heart failure, who is admitted to [facility] on 04/09/2020 with suspected subacute upper gastrointestinal bleed after presenting from home to [facility] ED complaining of 3 months of dark-colored stools.     Principal Problem:   Acute upper GI bleed Active Problems:   Tobacco abuse   Hyperlipidemia   Acute blood loss anemia   AKI (acute kidney injury) (HCC)   Atrial fibrillation (HCC)       #) Subacute Upper GI Bleed: diagnosis on the basis of 3 months of dark-colored stool, with finding of guaiac stool.  DRE performed in the ED today, with associated evidence of acute to subacute blood loss anemia, as further described below.  Suspect that this is truly subacute and onset given not only the patient's report of 55-month duration of dark-colored stools, but also based upon the patient's hemodynamic stability in the setting of a hemoglobin of 5.3, which would be less likely in the setting of a fast, truly acute upper gastrointestinal bleed with associated acute blood loss anemia.  Aside from 1 month of progressive generalized weakness, fatigue, and mild shortness of breath with exertion, the patient otherwise appears asymptomatic, and denies any associated abdominal pain.  Patient is chronically anticoagulated on Eliquis in the setting of paroxysmal atrial fibrillation, with most recent dose Eliquis occurring on  the morning of 04/09/2020 .  Otherwise, is of note blood thinning agents at home, including no aspirin.  Denies any NSAID use.  No known history of liver disease, denies any history of alcohol abuse.  Has never undergone prior endoscopic evaluation.  Differential includes esophagitis versus gastritis versus peptic ulcer disease, including gastric versus duodenal ulcer versus AVM.  In the absence of known liver disease, initiation of SBP prophylaxis does not appear to be warranted. Presentation and history are less suggestive of variceal bleed, and therefore there does not appear to be an indication for octreotide.    At this time, the patient appears hemodynamically stable, with normotensive blood pressures in the absence of any associated tachycardia.  Given suspected upper gastrointestinal source, will initiate IV Protonix, as further described below, and will consult gastroenterology.  Of note, the patient was typed and screened for 2 units PRBC in the ED this evening, with initiation of transfusion of first such unit while still in the ED.      Plan: NPO MN. Refraining from pharmacologic DVT prophylaxis. Monitor on telemetry. Monitor continuous pulse-ox. Maintain at least 2 large bore IV's. Check INR in the AM. Q4H H&H's have been ordered through 9 AM tomorrow (04/10/20).  Continue transfusion of 2 units PRBC, with close attention for development of acute volume overload in the setting of chronic diastolic heart failure. Will closely monitor ensuing serial Hgb levels and correlate these data points with the patient's overall clinical picture including vital signs to determine need for subsequent transfusion, including assurance of appropriate response to these 2 units prbc.  Protonix 40 mg IV twice daily.  GI consult, as above.  Recheck CMP in the morning.  Repeat CBC in the morning as well.  Initial evaluation management of associated subacute blood loss anemia, as further described below.  Hold home  Eliquis for now.       #) Subacute blood loss anemia: in the setting of suspected subacute upper GI bleed, as above, presenting Hgb noted to be 5.3, which is relative to most recent prior Hgb value that I been able to identify in the  EMR, which was noted to be 14.6 in June 2020.  Presenting hemoglobin associated with microcytic, hypochromic findings as well as an elevated RDW, which appears consistent with blood loss anemia.  The patient appears hemodynamically stable the exception of the previously described generalized weakness and fatigue.  Strongly suspect subacute timeframe for the onset of his blood loss anemia given hemodynamic stability and relative asymptomatic nature of his presentation in the context of a low presenting hemoglobin value of 5.3.  Transfusion of 2 units PBC was initiated in the ED today.   Plan: work-up and management for presenting suspected acute upper GI bleed, as above, including close monitoring of Q4H H&H's.  Complete transfusion of the 2 units PRBC that was initiated in the ED. Monitor on telemetry. Monitor continuous pulse-ox. NPO MN. Refraining from pharmacologic DVT prophylaxis.  Add on INR, and recheck in the morning.  CBC in the morning.  Recommendations.  Ensuing GI consult, as above.  Add on the following to pretransfusion labs collected in the ED today: Total iron, TIBC, ferritin, MMA, folic acid, reticulocyte count, and peripheral smear.  Hold home Eliquis for now.      #) Acute kidney injury: Presenting serum creatinine of 1.57 is presumed to represent finding a baseline creatinine of 1.2-1.3 as well as most recent prior creatinine of 1.23 in June 2020.  There is the possibility of interval development of mild chronic kidney disease.  However, in the context of recent onset of neurologic factors decreasing oxygen carrying capacity as well as oxygen delivery capacity, in the context of suspected subacute gastrointestinal bleed and associates of acute blood  loss anemia, suspect that presenting elevated serum creatinine represents a true acute kidney injury.   Plan: Check urinalysis with microscopy with attention for urinary gas.  Add on random urine sodium as well as random urine creatinine.  Monitor strict I's and O's and daily weights.  Attempt to avoid nephrotoxic agents.  Repeat BMP in the morning.  Recommend management of subacute upper gastrointestinal bleed and associates of acute blood loss anemia, as above.      #) Generalized weakness: the patient reports 1 month of progressive generalized weakness and fatigue, in the absence of any evidence of acute focal neurologic deficits, including no evidence of acute focal weakness. Consequently, acute ischemic CVA is felt to be less likely at this time.  Suspect strong contribution from the longevity of his apparent subacute upper gastrointestinal bleed with associated subacute blood loss anemia.  No evidence of underlying infectious process at this time, although urinalysis is currently pending.  COVID-19 PCR performed in the ED today was found to be negative.   Plan: work-up and management of presenting subacute upper gastrointestinal bleed as well as associates of acute blood loss anemia, iron studies, MMA, folic acid studies have been added to pretransfusion labs, as described above. Physical therapy consult has been placed for the morning.  Check TSH.  Follow for result urinalysis.     #) Hyperlipidemia: On high intensity rosuvastatin as an outpatient.  Plan: Continue home statin.      #) Paroxysmal atrial fibrillation: Documented history of such. In the setting of a CHA2DS2-VASc score of 4, there is an indication for the patient to be on chronic anticoagulation for thromboembolic prophylaxis. Consistent with this, the patient is chronically anticoagulated on Eliquis.  He is on daily oral amiodarone as an outpatient, in the absence of any additional AV nodal blocking agents.  Most recent  echocardiogram, which was performed in June  2020, was normal for mildly dilated left atrium as well as no significant valvular pathology, with further findings as described above.  Appears to be in normal sinus rhythm at time of presentation, but is at risk for going into atrial fibrillation with RVR due to physiologic stress and hypovolemic circumstances associated with presenting upper gastrointestinal bleed.   Plan: monitor strict I's & O's and daily weights. Repeat BMP in the morning. Check serum magnesium level.  Continue amiodarone.  We will hold Eliquis for now in the setting of subacute progress intestinal bleed, as above.  Monitor on telemetry.       #) Restless leg syndrome: On Requip as an outpatient.  Suspect contribution to this diagnosis from underlying iron deficiency anemia, as further described above.  Plan: Continue home Requip.  Work-up and management of presenting subacute upper gastrointestinal bleed and associated subacute blood loss anemia, as above, including evaluation of iron studies have been added to pretransfusion labs.        #) Chronic diastolic heart failure: Documented history of such, with most recent echocardiogram in June 2020 showing mildly dilated left ventricle with severe LVH, LVEF 50 to 55%, and evidence of diastolic dysfunction.  No clinical evidence to suggest acutely decompensated heart failure.  However, will monitor closely for ensuing development of acute volume overload in the setting of plan for PRBC transfusion.  Not on a scheduled diuretic as an outpatient.  Of note, presenting EKG showed normal sinus rhythm without evidence of acute ischemic changes, as further detailed above.  Plan: Monitor strict I's and O's and daily weights.  In the setting of PRBC transfusion, will also monitor continuous pulse oximetry.  Add on serum magnesium level.  BMP in the morning.      #) Chronic tobacco abuse: The patient reports that he is a current  smoker, and that he has smoked half pack per day over the last 50 years.   Plan: Counseled patient on the importance of complete smoking discontinuation.     DVT prophylaxis: scd's  Code Status: Full code Family Communication: none Disposition Plan: Per Rounding Team Admission status: observation; med-tele.    Of note, this patient was added by me to the following Admit List/Treatment Team:  armcadmits     PLEASE NOTE THAT DRAGON DICTATION SOFTWARE WAS USED IN THE CONSTRUCTION OF THIS NOTE.   Angie Fava DO [facility] Pager [phone] From 12PM - 12AM  Otherwise, please contact night-coverage  [email or web address] Password [facility]   04/09/2020, 6:13 PM

## 2020-04-09 NOTE — ED Triage Notes (Signed)
Pt with c/o weakness x several months, states he thinks he might be having tarry stools x several months. Went to get labs drawn before an MD appointment and hgb was 5.5/ Pt states he also feels tired and sometimes SOB.

## 2020-04-09 NOTE — ED Notes (Signed)
Pt presents to ED with c/o of fatigue and SOB. Pt states he has a MD apt tomo and normally gets blood work done prior to apt. Pt states he has been feeling more fatigued and SOB for about 3 months. Pt also states he takes a blood thinner for a prior CVA 2 years ago. Pt states black tarry stools for the past few months as well. Pt denies hematuria or hemoptysis. Pt looks slightly pale. Pt is A&OX4. NAD noted.

## 2020-04-09 NOTE — ED Notes (Signed)
Blood doubled verified with Kathlen Mody, RN. VSS. All risks discussed with pt prior to start of blood.

## 2020-04-10 ENCOUNTER — Encounter: Payer: Self-pay | Admitting: Internal Medicine

## 2020-04-10 DIAGNOSIS — D62 Acute posthemorrhagic anemia: Secondary | ICD-10-CM | POA: Diagnosis present

## 2020-04-10 DIAGNOSIS — Z79899 Other long term (current) drug therapy: Secondary | ICD-10-CM | POA: Diagnosis not present

## 2020-04-10 DIAGNOSIS — K635 Polyp of colon: Secondary | ICD-10-CM | POA: Diagnosis present

## 2020-04-10 DIAGNOSIS — I130 Hypertensive heart and chronic kidney disease with heart failure and stage 1 through stage 4 chronic kidney disease, or unspecified chronic kidney disease: Secondary | ICD-10-CM | POA: Diagnosis present

## 2020-04-10 DIAGNOSIS — K5730 Diverticulosis of large intestine without perforation or abscess without bleeding: Secondary | ICD-10-CM | POA: Diagnosis present

## 2020-04-10 DIAGNOSIS — K640 First degree hemorrhoids: Secondary | ICD-10-CM | POA: Diagnosis present

## 2020-04-10 DIAGNOSIS — I480 Paroxysmal atrial fibrillation: Secondary | ICD-10-CM | POA: Diagnosis present

## 2020-04-10 DIAGNOSIS — Z8673 Personal history of transient ischemic attack (TIA), and cerebral infarction without residual deficits: Secondary | ICD-10-CM | POA: Diagnosis not present

## 2020-04-10 DIAGNOSIS — D509 Iron deficiency anemia, unspecified: Secondary | ICD-10-CM

## 2020-04-10 DIAGNOSIS — I5032 Chronic diastolic (congestive) heart failure: Secondary | ICD-10-CM | POA: Diagnosis present

## 2020-04-10 DIAGNOSIS — G2581 Restless legs syndrome: Secondary | ICD-10-CM | POA: Diagnosis present

## 2020-04-10 DIAGNOSIS — F17210 Nicotine dependence, cigarettes, uncomplicated: Secondary | ICD-10-CM | POA: Diagnosis present

## 2020-04-10 DIAGNOSIS — Z7901 Long term (current) use of anticoagulants: Secondary | ICD-10-CM | POA: Diagnosis not present

## 2020-04-10 DIAGNOSIS — N1831 Chronic kidney disease, stage 3a: Secondary | ICD-10-CM | POA: Diagnosis present

## 2020-04-10 DIAGNOSIS — E039 Hypothyroidism, unspecified: Secondary | ICD-10-CM | POA: Diagnosis present

## 2020-04-10 DIAGNOSIS — K922 Gastrointestinal hemorrhage, unspecified: Secondary | ICD-10-CM | POA: Diagnosis present

## 2020-04-10 DIAGNOSIS — Z20822 Contact with and (suspected) exposure to covid-19: Secondary | ICD-10-CM | POA: Diagnosis present

## 2020-04-10 DIAGNOSIS — F419 Anxiety disorder, unspecified: Secondary | ICD-10-CM | POA: Diagnosis present

## 2020-04-10 DIAGNOSIS — K2970 Gastritis, unspecified, without bleeding: Secondary | ICD-10-CM | POA: Diagnosis present

## 2020-04-10 DIAGNOSIS — N179 Acute kidney failure, unspecified: Secondary | ICD-10-CM | POA: Diagnosis present

## 2020-04-10 DIAGNOSIS — E785 Hyperlipidemia, unspecified: Secondary | ICD-10-CM | POA: Diagnosis present

## 2020-04-10 LAB — PATHOLOGIST SMEAR REVIEW

## 2020-04-10 LAB — HEMOGLOBIN AND HEMATOCRIT, BLOOD
HCT: 26.2 % — ABNORMAL LOW (ref 39.0–52.0)
Hemoglobin: 7.5 g/dL — ABNORMAL LOW (ref 13.0–17.0)

## 2020-04-10 LAB — COMPREHENSIVE METABOLIC PANEL
ALT: 16 U/L (ref 0–44)
AST: 20 U/L (ref 15–41)
Albumin: 3.6 g/dL (ref 3.5–5.0)
Alkaline Phosphatase: 60 U/L (ref 38–126)
Anion gap: 7 (ref 5–15)
BUN: 23 mg/dL (ref 8–23)
CO2: 22 mmol/L (ref 22–32)
Calcium: 8.6 mg/dL — ABNORMAL LOW (ref 8.9–10.3)
Chloride: 108 mmol/L (ref 98–111)
Creatinine, Ser: 1.58 mg/dL — ABNORMAL HIGH (ref 0.61–1.24)
GFR, Estimated: 48 mL/min — ABNORMAL LOW (ref >60)
Glucose, Bld: 91 mg/dL (ref 70–99)
Potassium: 4.2 mmol/L (ref 3.5–5.1)
Sodium: 137 mmol/L (ref 135–145)
Total Bilirubin: 0.5 mg/dL (ref 0.3–1.2)
Total Protein: 6.8 g/dL (ref 6.5–8.1)

## 2020-04-10 LAB — CBC
HCT: 22.5 % — ABNORMAL LOW (ref 39.0–52.0)
Hemoglobin: 6.3 g/dL — ABNORMAL LOW (ref 13.0–17.0)
MCH: 20.1 pg — ABNORMAL LOW (ref 26.0–34.0)
MCHC: 28 g/dL — ABNORMAL LOW (ref 30.0–36.0)
MCV: 71.7 fL — ABNORMAL LOW (ref 80.0–100.0)
Platelets: 168 10*3/uL (ref 150–400)
RBC: 3.14 MIL/uL — ABNORMAL LOW (ref 4.22–5.81)
RDW: 22 % — ABNORMAL HIGH (ref 11.5–15.5)
WBC: 6 10*3/uL (ref 4.0–10.5)
nRBC: 0 % (ref 0.0–0.2)

## 2020-04-10 LAB — URINALYSIS, COMPLETE (UACMP) WITH MICROSCOPIC
Bacteria, UA: NONE SEEN
Bilirubin Urine: NEGATIVE
Glucose, UA: NEGATIVE mg/dL
Ketones, ur: NEGATIVE mg/dL
Leukocytes,Ua: NEGATIVE
Nitrite: NEGATIVE
Protein, ur: NEGATIVE mg/dL
Specific Gravity, Urine: 1.011 (ref 1.005–1.030)
Squamous Epithelial / HPF: NONE SEEN (ref 0–5)
pH: 6 (ref 5.0–8.0)

## 2020-04-10 LAB — SODIUM, URINE, RANDOM: Sodium, Ur: 87 mmol/L

## 2020-04-10 LAB — T4, FREE: Free T4: 0.41 ng/dL — ABNORMAL LOW (ref 0.61–1.12)

## 2020-04-10 LAB — CREATININE, URINE, RANDOM: Creatinine, Urine: 79 mg/dL

## 2020-04-10 LAB — PROTIME-INR
INR: 1.4 — ABNORMAL HIGH (ref 0.8–1.2)
Prothrombin Time: 16.4 s — ABNORMAL HIGH (ref 11.4–15.2)

## 2020-04-10 LAB — HIV ANTIBODY (ROUTINE TESTING W REFLEX): HIV Screen 4th Generation wRfx: NONREACTIVE

## 2020-04-10 LAB — MAGNESIUM: Magnesium: 2.3 mg/dL (ref 1.7–2.4)

## 2020-04-10 LAB — PREPARE RBC (CROSSMATCH)

## 2020-04-10 LAB — TSH: TSH: 69 u[IU]/mL — ABNORMAL HIGH (ref 0.350–4.500)

## 2020-04-10 MED ORDER — BISACODYL 5 MG PO TBEC
10.0000 mg | DELAYED_RELEASE_TABLET | Freq: Once | ORAL | Status: AC
  Administered 2020-04-10: 10 mg via ORAL
  Filled 2020-04-10: qty 2

## 2020-04-10 MED ORDER — PEG 3350-KCL-NA BICARB-NACL 420 G PO SOLR
4000.0000 mL | Freq: Once | ORAL | Status: AC
  Administered 2020-04-10: 4000 mL via ORAL
  Filled 2020-04-10: qty 4000

## 2020-04-10 MED ORDER — GABAPENTIN 300 MG PO CAPS
300.0000 mg | ORAL_CAPSULE | Freq: Every day | ORAL | Status: DC
  Administered 2020-04-10 – 2020-04-11 (×2): 300 mg via ORAL
  Filled 2020-04-10 (×2): qty 1

## 2020-04-10 MED ORDER — GABAPENTIN 300 MG PO CAPS
300.0000 mg | ORAL_CAPSULE | Freq: Once | ORAL | Status: AC
  Administered 2020-04-10: 300 mg via ORAL
  Filled 2020-04-10: qty 1

## 2020-04-10 MED ORDER — LEVOTHYROXINE SODIUM 50 MCG PO TABS
50.0000 ug | ORAL_TABLET | Freq: Every day | ORAL | Status: DC
  Administered 2020-04-11 – 2020-04-12 (×2): 50 ug via ORAL
  Filled 2020-04-10 (×2): qty 1

## 2020-04-10 MED ORDER — GABAPENTIN 100 MG PO CAPS
100.0000 mg | ORAL_CAPSULE | Freq: Two times a day (BID) | ORAL | Status: DC
  Administered 2020-04-10 – 2020-04-12 (×4): 100 mg via ORAL
  Filled 2020-04-10 (×4): qty 1

## 2020-04-10 MED ORDER — SODIUM CHLORIDE 0.9% IV SOLUTION: Freq: Once | INTRAVENOUS | Status: AC

## 2020-04-10 MED ORDER — DIAZEPAM 5 MG PO TABS
10.0000 mg | ORAL_TABLET | Freq: Three times a day (TID) | ORAL | Status: DC | PRN
  Administered 2020-04-10 – 2020-04-12 (×2): 10 mg via ORAL
  Filled 2020-04-10 (×2): qty 2

## 2020-04-10 NOTE — Evaluation (Signed)
Physical Therapy Evaluation Patient Details Name: Perry Martinez MRN: [id number] DOB: 1955/10/19 Today's Date: 04/10/2020   History of Present Illness  65 yo male with hx of stroke, atrial fibrillation, and HTN. He presents d/t c/o dizziness and severe fatigue, gradual onset and worsening over the past 2-3 months. Hgb levels 5.3 at admission.  Clinical Impression  The pt presents with improved activity tolerance when compared to tolerance leading to admission. The pt states that he presented with mild balance deficits prior to admission d/t PMHx significant for CVA. He continues to present with diminished sensation on the plantar surfaces of bilateral feet. The pt was able to ambulated 1000' with PT this session prior to fatigue in which the pt reports is "much better" than prior to admission. At this time it is expected that the patient will safely be able to d/c home with intermittent support from significant other once medically stable.     Follow Up Recommendations No PT follow up    Equipment Recommendations       Recommendations for Other Services       Precautions / Restrictions Precautions Precautions: Fall Precaution Comments: Pt reporting diminished sensation on the plantar surface of bialteral feet. Restrictions Weight Bearing Restrictions: No      Mobility  Bed Mobility Overal bed mobility: Independent                  Transfers Overall transfer level: Independent                  Ambulation/Gait Ambulation/Gait assistance: Min guard Gait Distance (Feet): 1000 Feet Assistive device: None       General Gait Details: Pt presents with strong heel strike, reports this is his baseline since CVA d/t diminished sensation of feet.  Stairs            Wheelchair Mobility    Modified Rankin (Stroke Patients Only)       Balance Overall balance assessment: History of Falls;Mild deficits observed, not formally tested                                            Pertinent Vitals/Pain Pain Assessment: 0-10 Pain Score: 4  Pain Location: L Restless leg Pain Descriptors / Indicators: Nagging Pain Intervention(s): Monitored during session    Home Living Family/patient expects to be discharged to:: Private residence Living Arrangements: Spouse/significant other Available Help at Discharge: Friend(s);Available PRN/intermittently Type of Home: House Home Access: Stairs to enter Entrance Stairs-Rails: None Entrance Stairs-Number of Steps: 1 Home Layout: One level Home Equipment: None      Prior Function Level of Independence: Independent         Comments: Independent with ADLs, household and community ambulation. Reports working full time. Reports distant hx of fall.     Hand Dominance   Dominant Hand: Right    Extremity/Trunk Assessment   Upper Extremity Assessment Upper Extremity Assessment: Overall WFL for tasks assessed    Lower Extremity Assessment Lower Extremity Assessment: Overall WFL for tasks assessed    Cervical / Trunk Assessment Cervical / Trunk Assessment: Normal  Communication   Communication: No difficulties  Cognition Arousal/Alertness: Awake/alert Behavior During Therapy: WFL for tasks assessed/performed Overall Cognitive Status: Within Functional Limits for tasks assessed  General Comments General comments (skin integrity, edema, etc.): Pt presenting with veering off path during ambulation, he reports this is d/t inability to feet bottom of feet. His balance improves when looking down at his feet. Mild fall risk noted.    Exercises     Assessment/Plan    PT Assessment Patent does not need any further PT services  PT Problem List         PT Treatment Interventions      PT Goals (Current goals can be found in the Care Plan section)  Acute Rehab PT Goals Patient Stated Goal: to return home PT Goal  Formulation: With patient Time For Goal Achievement: 04/24/20 Potential to Achieve Goals: Good    Frequency     Barriers to discharge        Co-evaluation               AM-PAC PT "6 Clicks" Mobility  Outcome Measure Help needed turning from your back to your side while in a flat bed without using bedrails?: None Help needed moving from lying on your back to sitting on the side of a flat bed without using bedrails?: None Help needed moving to and from a bed to a chair (including a wheelchair)?: None Help needed standing up from a chair using your arms (e.g., wheelchair or bedside chair)?: None Help needed to walk in hospital room?: A Little Help needed climbing 3-5 steps with a railing? : A Little 6 Click Score: 22    End of Session   Activity Tolerance: Patient tolerated treatment well Patient left: in bed Nurse Communication: Mobility status PT Visit Diagnosis: Unsteadiness on feet (R26.81)    Time: 9147-8295 PT Time Calculation (min) (ACUTE ONLY): 36 min   Charges:   PT Evaluation $PT Eval Low Complexity: 1 Low PT Treatments $Gait Training: 23-37 mins        12:57 PM, 04/10/20 Cedrik Heindl A. Mordecai Maes PT, DPT Physical Therapist - [facility]   Basia Mcginty A Emanuele Mcwhirter 04/10/2020, 12:54 PM

## 2020-04-10 NOTE — Progress Notes (Addendum)
Progress Note    Perry Martinez  RWE:[id number] DOB: [birth year]  DOA: 04/09/2020 PCP: Sherron Monday, MD      Brief Narrative:    Medical records reviewed and are as summarized below:  Perry Martinez is a 65 y.o. male       Assessment/Plan:   Principal Problem:   Acute upper GI bleed Active Problems:   Tobacco abuse   Hyperlipidemia   Acute blood loss anemia   AKI (acute kidney injury) (HCC)   Atrial fibrillation (HCC)    Body mass index is 26.49 kg/m.    Acute upper GI bleeding: Keep n.p.o.  Continue IV Protonix.  Eliquis has been held.  Follow-up with gastroenterologist for further recommendations.  Acute blood loss anemia: s/p 3 units of packed red blood cells.  Monitor H&H.  Paroxysmal atrial fibrillation with history of stroke in June 2020: Continue amiodarone.  Eliquis has been held.  Hypothyroidism: TSH was 69, free T4 was 0.41.  Patient denies any history of hypothyroidism.  However, chart review showed her TSH was 19.18 about 3 years ago.  He said he started amiodarone about 1-1/2 years ago.  Case was discussed with his cardiologist, Dr. Jeanie Cooks.  He recommended we keep amiodarone for now since hypothyroidism can be easily treated.  He will see him in the office after discharge from the hospital.  Anxiety: Continue diazepam  Chronic diastolic CHF: Compensated.  Suspect patient has underlying CKD stage IIIa: Creatinine is stable.  Continue to monitor.   Diet Order            Diet NPO time specified  Diet effective midnight                    Consultants:  Gastroenterologist  Procedures:  None    Medications:   . amiodarone  200 mg Oral Daily  . gabapentin  100 mg Oral BID WC  . gabapentin  300 mg Oral QHS  . [START ON 04/11/2020] levothyroxine  50 mcg Oral Q0600  . pantoprazole (PROTONIX) IV  40 mg Intravenous Q12H  . rOPINIRole  0.5 mg Oral QHS  . rosuvastatin  40 mg Oral Daily   Continuous  Infusions:   Anti-infectives (From admission, onward)   None             Family Communication/Anticipated D/C date and plan/Code Status   DVT prophylaxis: SCDs Start: 04/09/20 1805     Code Status: Full Code  Family Communication: None Disposition Plan:    Status is: Inpatient  Remains inpatient appropriate because:IV treatments appropriate due to intensity of illness or inability to take PO and Inpatient level of care appropriate due to severity of illness   Dispo: The patient is from: Home              Anticipated d/c is to: Home              Anticipated d/c date is: 2 days              Patient currently is not medically stable to d/c.   Difficult to place patient No           Subjective:   Interval events noted.  No chest pain, shortness of breath, hematemesis or bleeding per rectum.  He feels a little better compared to yesterday.  He still feels weak and tired.  Objective:    Vitals:   04/10/20 0845 04/10/20 0923 04/10/20 1114 04/10/20 1247  BP: 121/81 124/86 129/72   Pulse: (!) 54 (!) 55 (!) 55   Resp: 18 20 (!) 21   Temp: 98 F (36.7 C) 98 F (36.7 C)    TempSrc: Oral Oral    SpO2: 98% 97% 98% 98%  Weight:      Height:       No data found.   Intake/Output Summary (Last 24 hours) at 04/10/2020 1408 Last data filed at 04/10/2020 0915 Gross per 24 hour  Intake 950 ml  Output 1875 ml  Net -925 ml   Filed Weights   04/09/20 1224 04/10/20 0640  Weight: 88.5 kg 88.6 kg    Exam:  GEN: NAD SKIN: Warm and dry EYES: EOMI ENT: MMM CV: RRR PULM: CTA B ABD: soft, ND, NT, +BS CNS: AAO x 3, non focal EXT: No edema or tenderness        Data Reviewed:   I have personally reviewed following labs and imaging studies:  Labs: Labs show the following:   Basic Metabolic Panel: Recent Labs  Lab 04/09/20 1229 04/10/20 0437  NA 135 137  K 4.6 4.2  CL 106 108  CO2 20* 22  GLUCOSE 115* 91  BUN 25* 23  CREATININE 1.57* 1.58*   CALCIUM 8.6* 8.6*  MG 2.2 2.3   GFR Estimated Creatinine Clearance: 50.5 mL/min (A) (by C-G formula based on SCr of 1.58 mg/dL (H)). Liver Function Tests: Recent Labs  Lab 04/09/20 1229 04/10/20 0437  AST 26 20  ALT 15 16  ALKPHOS 71 60  BILITOT 0.5 0.5  PROT 7.5 6.8  ALBUMIN 4.1 3.6   No results for input(s): LIPASE, AMYLASE in the last 168 hours. No results for input(s): AMMONIA in the last 168 hours. Coagulation profile Recent Labs  Lab 04/09/20 1229 04/10/20 0437  INR 1.6* 1.4*    CBC: Recent Labs  Lab 04/09/20 1229 04/10/20 0437 04/10/20 1041  WBC 4.7 6.0  --   HGB 5.3* 6.3* 7.5*  HCT 20.1* 22.5* 26.2*  MCV 68.1* 71.7*  --   PLT 186 168  --    Cardiac Enzymes: No results for input(s): CKTOTAL, CKMB, CKMBINDEX, TROPONINI in the last 168 hours. BNP (last 3 results) No results for input(s): PROBNP in the last 8760 hours. CBG: No results for input(s): GLUCAP in the last 168 hours. D-Dimer: No results for input(s): DDIMER in the last 72 hours. Hgb A1c: No results for input(s): HGBA1C in the last 72 hours. Lipid Profile: No results for input(s): CHOL, HDL, LDLCALC, TRIG, CHOLHDL, LDLDIRECT in the last 72 hours. Thyroid function studies: Recent Labs    04/10/20 0437  TSH 69.000*   Anemia work up: Recent Labs    04/09/20 1229  FOLATE 9.3  FERRITIN 5*  TIBC 615*  IRON 17*  RETICCTPCT 2.8   Sepsis Labs: Recent Labs  Lab 04/09/20 1229 04/10/20 0437  WBC 4.7 6.0    Microbiology Recent Results (from the past 240 hour(s))  Resp Panel by RT-PCR (Flu A&B, Covid) Nasopharyngeal Swab     Status: None   Collection Time: 04/09/20  3:00 PM   Specimen: Nasopharyngeal Swab; Nasopharyngeal(NP) swabs in vial transport medium  Result Value Ref Range Status   SARS Coronavirus 2 by RT PCR NEGATIVE NEGATIVE Final    Comment: (NOTE) SARS-CoV-2 target nucleic acids are NOT DETECTED.  The SARS-CoV-2 RNA is generally detectable in upper  respiratory specimens during the acute phase of infection. The lowest concentration of SARS-CoV-2 viral copies this assay can detect  is 138 copies/mL. A negative result does not preclude SARS-Cov-2 infection and should not be used as the sole basis for treatment or other patient management decisions. A negative result may occur with  improper specimen collection/handling, submission of specimen other than nasopharyngeal swab, presence of viral mutation(s) within the areas targeted by this assay, and inadequate number of viral copies(<138 copies/mL). A negative result must be combined with clinical observations, patient history, and epidemiological information. The expected result is Negative.  Fact Sheet for Patients:  BloggerCourse.com  Fact Sheet for Healthcare Providers:  SeriousBroker.it  This test is no t yet approved or cleared by the [place] FDA and  has been authorized for detection and/or diagnosis of SARS-CoV-2 by FDA under an Emergency Use Authorization (EUA). This EUA will remain  in effect (meaning this test can be used) for the duration of the COVID-19 declaration under Section 564(b)(1) of the Act, 21 U.S.C.section 360bbb-3(b)(1), unless the authorization is terminated  or revoked sooner.       Influenza A by PCR NEGATIVE NEGATIVE Final   Influenza B by PCR NEGATIVE NEGATIVE Final    Comment: (NOTE) The Xpert Xpress SARS-CoV-2/FLU/RSV plus assay is intended as an aid in the diagnosis of influenza from Nasopharyngeal swab specimens and should not be used as a sole basis for treatment. Nasal washings and aspirates are unacceptable for Xpert Xpress SARS-CoV-2/FLU/RSV testing.  Fact Sheet for Patients: BloggerCourse.com  Fact Sheet for Healthcare Providers: SeriousBroker.it  This test is not yet approved or cleared by the [place] FDA and has been  authorized for detection and/or diagnosis of SARS-CoV-2 by FDA under an Emergency Use Authorization (EUA). This EUA will remain in effect (meaning this test can be used) for the duration of the COVID-19 declaration under Section 564(b)(1) of the Act, 21 U.S.C. section 360bbb-3(b)(1), unless the authorization is terminated or revoked.  Performed at [facility], [address] [place], [place] [address]     Procedures and diagnostic studies:  No results found.             LOS: 0 days   Adylin Hankey  [facility]   Pager on [email or web address]. If 7PM-7AM, please contact night-coverage at [email or web address]     04/10/2020, 2:08 PM

## 2020-04-10 NOTE — H&P (View-Only) (Signed)
  Perry Salvi, MD [address], [address], [place], [place], [address] [address], [address], [place], [place], [address] Phone: [phone]  Fax: [phone]  Consultation  Referring Provider:     Dr. Howerter Primary Care Physician:  Tejan-Sie, S Ahmed, MD Reason for Consultation:     GI bleed  Date of Admission:  04/09/2020 Date of Consultation:  04/10/2020         HPI:   Perry Martinez is a 64 y.o. male with history of paroxysmal A. fib, on Eliquis, last use on 04/09/2020 morning, presents after having blood work by PCP that showed that he was acutely anemic.  Patient denies any melena, hematochezia, hematemesis.  Denies any other sources of bleeding as well.  No abdominal pain, nausea or vomiting, hematemesis. Patient has never had an upper or lower endoscopy. No previous history of GI bleed.  Patient noted to be acutely anemic on presentation with hemoglobin of 5.3, with last known hemoglobin a year ago been 14.6.  Past Medical History:  Diagnosis Date  . Acute blood loss anemia 04/09/2020  . Acute CVA (cerebrovascular accident) (HCC) 08/09/2018  . AKI (acute kidney injury) (HCC) 04/09/2020  . Atrial fibrillation (HCC)   . Depression   . Hyperlipidemia   . Hypertension   . Hypothyroidism 06/28/2016  . Substance abuse (HCC)    IV drugs when younger    Past Surgical History:  Procedure Laterality Date  . FRACTURE SURGERY     right foot  . ROTATOR CUFF REPAIR     right shoulder    Prior to Admission medications   Medication Sig Start Date End Date Taking? Authorizing Provider  amiodarone (PACERONE) 200 MG tablet Take 200 mg by mouth daily.   Yes [provider]  apixaban (ELIQUIS) 5 MG TABS tablet Take 1 tablet (5 mg total) by mouth 2 (two) times daily. 07/04/18  Yes Paduchowski, Kevin, MD  diazepam (VALIUM) 10 MG tablet Take 10 mg by mouth at bedtime. 07/17/18  Yes [provider]  furosemide (LASIX) 20 MG tablet Take 20 mg by mouth daily as  needed. 07/26/18  Yes [provider]  gabapentin (NEURONTIN) 300 MG capsule Take 300 mg by mouth See admin instructions. Take 1 in the morning, 1 in evening and 3 at bedtime 02/24/20  Yes [provider]  metoprolol succinate (TOPROL XL) 50 MG 24 hr tablet Take 1 tablet (50 mg total) by mouth daily. Take with or immediately following a meal. 07/04/18 07/04/19 Yes Paduchowski, Kevin, MD  rOPINIRole (REQUIP) 0.25 MG tablet Take 0.5 mg by mouth at bedtime. 07/17/18  Yes [provider]  rosuvastatin (CRESTOR) 40 MG tablet Take 40 mg by mouth daily. 07/06/18  Yes [provider]    Family History  Problem Relation Age of Onset  . Heart disease Mother        dieed at 83  . Arthritis Mother   . Diabetes Mother   . Heart disease Father 45       bypass  . Arthritis Father   . Hyperlipidemia Father   . Hypertension Father   . Rheumatic fever Father      Social History   Tobacco Use  . Smoking status: Current Every Day Smoker    Packs/day: 0.50    Years: 50.00    Pack years: 25.00    Types: Cigarettes    Start date: 06/28/1966  . Smokeless tobacco: Never Used  . Tobacco comment: previously smoked 1ppd  Vaping   Use  . Vaping Use: Never used  Substance Use Topics  . Alcohol use: No  . Drug use: No    Allergies as of 04/09/2020  . (No Known Allergies)    Review of Systems:    All systems reviewed and negative except where noted in HPI.   Physical Exam:  Vital signs in last 24 hours: Vitals:   04/10/20 0640 04/10/20 0845 04/10/20 0923 04/10/20 1114  BP: 104/73 121/81 124/86 129/72  Pulse: (!) 58 (!) 54 (!) 55 (!) 55  Resp: 18 18 20  (!) 21  Temp: 97.6 F (36.4 C) 98 F (36.7 C) 98 F (36.7 C)   TempSrc: Oral Oral Oral   SpO2: 97% 98% 97% 98%  Weight: 88.6 kg     Height:       Last BM Date: 04/08/20 General:   Pleasant, cooperative in NAD Head:  Normocephalic and atraumatic. Eyes:   No icterus.   Conjunctiva pink. PERRLA. Ears:  Normal  auditory acuity. Neck:  Supple; no masses or thyroidomegaly Lungs: Respirations even and unlabored. Lungs clear to auscultation bilaterally.   No wheezes, crackles, or rhonchi.  Abdomen:  Soft, nondistended, nontender. Normal bowel sounds. No appreciable masses or hepatomegaly.  No rebound or guarding.  Neurologic:  Alert and oriented x3;  grossly normal neurologically. Skin:  Intact without significant lesions or rashes. Cervical Nodes:  No significant cervical adenopathy. Psych:  Alert and cooperative. Normal affect.  LAB RESULTS: Recent Labs    04/09/20 1229 04/10/20 0437 04/10/20 1041  WBC 4.7 6.0  --   HGB 5.3* 6.3* 7.5*  HCT 20.1* 22.5* 26.2*  PLT 186 168  --    BMET Recent Labs    04/09/20 1229 04/10/20 0437  NA 135 137  K 4.6 4.2  CL 106 108  CO2 20* 22  GLUCOSE 115* 91  BUN 25* 23  CREATININE 1.57* 1.58*  CALCIUM 8.6* 8.6*   LFT Recent Labs    04/10/20 0437  PROT 6.8  ALBUMIN 3.6  AST 20  ALT 16  ALKPHOS 60  BILITOT 0.5   PT/INR Recent Labs    04/09/20 1229 04/10/20 0437  LABPROT 18.3* 16.4*  INR 1.6* 1.4*    STUDIES: No results found.    Impression / Plan:   Perry Martinez is a 65 y.o. y/o male with dark stool for the last 3 months at home, and acute anemia on presentation in the setting of Eliquis use for A. fib with Hemoccult positive stool  Patient is has never had an upper and lower endoscopy Last Eliquis use was yesterday morning Eliquis will need to be held for 48 hours prior to endoscopy  We'll plan for upper and lower endoscopy for evaluation of acute anemia and Hemoccult-positive stool with Dr. 71 tomorrow  Dr. Norma Fredrickson will be following the patient over the weekend  Clear liquid diet okay today  N.p.o. past midnight  PPI IV twice daily  Continue serial CBCs and transfuse PRN Avoid NSAIDs Maintain 2 large-bore IV lines Please page GI with any acute hemodynamic changes, or signs of active GI bleeding  Patient is  hemodynamically stable at this time and does not need an emergent endoscopy. However, if this changes, please page GI for further evaluation of earlier endoscopy before Eliquis has cleared the system  I have discussed alternative options, risks & benefits,  which include, but are not limited to, bleeding, infection, perforation,respiratory complication & drug reaction.  The patient agrees with this plan & written  consent will be obtained.     Thank you for involving me in the care of this patient.      LOS: 0 days   Pasty Spillers, MD  04/10/2020, 11:51 AM

## 2020-04-10 NOTE — Consult Note (Signed)
Perry Bouillon, MD [address], [address], [place], [place], [address] [address], [address], [place], [place], [address] Phone: [phone]  Fax: [phone]  Consultation  Referring Provider:     Dr. Arlean Hopping Primary Care Physician:  Sherron Monday, MD Reason for Consultation:     GI bleed  Date of Admission:  04/09/2020 Date of Consultation:  04/10/2020         HPI:   Perry Martinez is a 65 y.o. male with history of paroxysmal A. fib, on Eliquis, last use on 04/09/2020 morning, presents after having blood work by PCP that showed that he was acutely anemic.  Patient denies any melena, hematochezia, hematemesis.  Denies any other sources of bleeding as well.  No abdominal pain, nausea or vomiting, hematemesis. Patient has never had an upper or lower endoscopy. No previous history of GI bleed.  Patient noted to be acutely anemic on presentation with hemoglobin of 5.3, with last known hemoglobin a year ago been 14.6.  Past Medical History:  Diagnosis Date  . Acute blood loss anemia 04/09/2020  . Acute CVA (cerebrovascular accident) (HCC) 08/09/2018  . AKI (acute kidney injury) (HCC) 04/09/2020  . Atrial fibrillation (HCC)   . Depression   . Hyperlipidemia   . Hypertension   . Hypothyroidism 06/28/2016  . Substance abuse (HCC)    IV drugs when younger    Past Surgical History:  Procedure Laterality Date  . FRACTURE SURGERY     right foot  . ROTATOR CUFF REPAIR     right shoulder    Prior to Admission medications   Medication Sig Start Date End Date Taking? Authorizing Provider  amiodarone (PACERONE) 200 MG tablet Take 200 mg by mouth daily.   Yes [provider]  apixaban (ELIQUIS) 5 MG TABS tablet Take 1 tablet (5 mg total) by mouth 2 (two) times daily. 07/04/18  Yes Paduchowski, Caryn Bee, MD  diazepam (VALIUM) 10 MG tablet Take 10 mg by mouth at bedtime. 07/17/18  Yes [provider]  furosemide (LASIX) 20 MG tablet Take 20 mg by mouth daily as  needed. 07/26/18  Yes [provider]  gabapentin (NEURONTIN) 300 MG capsule Take 300 mg by mouth See admin instructions. Take 1 in the morning, 1 in evening and 3 at bedtime 02/24/20  Yes [provider]  metoprolol succinate (TOPROL XL) 50 MG 24 hr tablet Take 1 tablet (50 mg total) by mouth daily. Take with or immediately following a meal. 07/04/18 07/04/19 Yes Paduchowski, Caryn Bee, MD  rOPINIRole (REQUIP) 0.25 MG tablet Take 0.5 mg by mouth at bedtime. 07/17/18  Yes [provider]  rosuvastatin (CRESTOR) 40 MG tablet Take 40 mg by mouth daily. 07/06/18  Yes [provider]    Family History  Problem Relation Age of Onset  . Heart disease Mother        dieed at 89  . Arthritis Mother   . Diabetes Mother   . Heart disease Father 45       bypass  . Arthritis Father   . Hyperlipidemia Father   . Hypertension Father   . Rheumatic fever Father      Social History   Tobacco Use  . Smoking status: Current Every Day Smoker    Packs/day: 0.50    Years: 50.00    Pack years: 25.00    Types: Cigarettes    Start date: 06/28/1966  . Smokeless tobacco: Never Used  . Tobacco comment: previously smoked 1ppd  Vaping  Use  . Vaping Use: Never used  Substance Use Topics  . Alcohol use: No  . Drug use: No    Allergies as of 04/09/2020  . (No Known Allergies)    Review of Systems:    All systems reviewed and negative except where noted in HPI.   Physical Exam:  Vital signs in last 24 hours: Vitals:   04/10/20 0640 04/10/20 0845 04/10/20 0923 04/10/20 1114  BP: 104/73 121/81 124/86 129/72  Pulse: (!) 58 (!) 54 (!) 55 (!) 55  Resp: 18 18 20  (!) 21  Temp: 97.6 F (36.4 C) 98 F (36.7 C) 98 F (36.7 C)   TempSrc: Oral Oral Oral   SpO2: 97% 98% 97% 98%  Weight: 88.6 kg     Height:       Last BM Date: 04/08/20 General:   Pleasant, cooperative in NAD Head:  Normocephalic and atraumatic. Eyes:   No icterus.   Conjunctiva pink. PERRLA. Ears:  Normal  auditory acuity. Neck:  Supple; no masses or thyroidomegaly Lungs: Respirations even and unlabored. Lungs clear to auscultation bilaterally.   No wheezes, crackles, or rhonchi.  Abdomen:  Soft, nondistended, nontender. Normal bowel sounds. No appreciable masses or hepatomegaly.  No rebound or guarding.  Neurologic:  Alert and oriented x3;  grossly normal neurologically. Skin:  Intact without significant lesions or rashes. Cervical Nodes:  No significant cervical adenopathy. Psych:  Alert and cooperative. Normal affect.  LAB RESULTS: Recent Labs    04/09/20 1229 04/10/20 0437 04/10/20 1041  WBC 4.7 6.0  --   HGB 5.3* 6.3* 7.5*  HCT 20.1* 22.5* 26.2*  PLT 186 168  --    BMET Recent Labs    04/09/20 1229 04/10/20 0437  NA 135 137  K 4.6 4.2  CL 106 108  CO2 20* 22  GLUCOSE 115* 91  BUN 25* 23  CREATININE 1.57* 1.58*  CALCIUM 8.6* 8.6*   LFT Recent Labs    04/10/20 0437  PROT 6.8  ALBUMIN 3.6  AST 20  ALT 16  ALKPHOS 60  BILITOT 0.5   PT/INR Recent Labs    04/09/20 1229 04/10/20 0437  LABPROT 18.3* 16.4*  INR 1.6* 1.4*    STUDIES: No results found.    Impression / Plan:   Perry Martinez is a 65 y.o. y/o male with dark stool for the last 3 months at home, and acute anemia on presentation in the setting of Eliquis use for A. fib with Hemoccult positive stool  Patient is has never had an upper and lower endoscopy Last Eliquis use was yesterday morning Eliquis will need to be held for 48 hours prior to endoscopy  We'll plan for upper and lower endoscopy for evaluation of acute anemia and Hemoccult-positive stool with Dr. 71 tomorrow  Dr. Norma Fredrickson will be following the patient over the weekend  Clear liquid diet okay today  N.p.o. past midnight  PPI IV twice daily  Continue serial CBCs and transfuse PRN Avoid NSAIDs Maintain 2 large-bore IV lines Please page GI with any acute hemodynamic changes, or signs of active GI bleeding  Patient is  hemodynamically stable at this time and does not need an emergent endoscopy. However, if this changes, please page GI for further evaluation of earlier endoscopy before Eliquis has cleared the system  I have discussed alternative options, risks & benefits,  which include, but are not limited to, bleeding, infection, perforation,respiratory complication & drug reaction.  The patient agrees with this plan & written  consent will be obtained.     Thank you for involving me in the care of this patient.      LOS: 0 days   Pasty Spillers, MD  04/10/2020, 11:51 AM

## 2020-04-11 ENCOUNTER — Inpatient Hospital Stay: Payer: Medicare Other | Admitting: Anesthesiology

## 2020-04-11 ENCOUNTER — Encounter
Admission: EM | Disposition: A | Discharge status: Home / Self Care | Payer: Self-pay | Source: Home / Self Care | Attending: Internal Medicine

## 2020-04-11 ENCOUNTER — Encounter: Payer: Self-pay | Admitting: Internal Medicine

## 2020-04-11 DIAGNOSIS — D62 Acute posthemorrhagic anemia: Secondary | ICD-10-CM | POA: Diagnosis not present

## 2020-04-11 DIAGNOSIS — I480 Paroxysmal atrial fibrillation: Secondary | ICD-10-CM | POA: Diagnosis not present

## 2020-04-11 DIAGNOSIS — K922 Gastrointestinal hemorrhage, unspecified: Secondary | ICD-10-CM | POA: Diagnosis not present

## 2020-04-11 HISTORY — PX: COLONOSCOPY: SHX5424

## 2020-04-11 HISTORY — PX: ESOPHAGOGASTRODUODENOSCOPY: SHX5428

## 2020-04-11 LAB — CBC WITH DIFFERENTIAL/PLATELET
Abs Immature Granulocytes: 0.02 10*3/uL (ref 0.00–0.07)
Basophils Absolute: 0 10*3/uL (ref 0.0–0.1)
Basophils Relative: 0 %
Eosinophils Absolute: 0.2 10*3/uL (ref 0.0–0.5)
Eosinophils Relative: 3 %
HCT: 28.1 % — ABNORMAL LOW (ref 39.0–52.0)
Hemoglobin: 8.2 g/dL — ABNORMAL LOW (ref 13.0–17.0)
Immature Granulocytes: 0 %
Lymphocytes Relative: 17 %
Lymphs Abs: 1 10*3/uL (ref 0.7–4.0)
MCH: 21.2 pg — ABNORMAL LOW (ref 26.0–34.0)
MCHC: 29.2 g/dL — ABNORMAL LOW (ref 30.0–36.0)
MCV: 72.8 fL — ABNORMAL LOW (ref 80.0–100.0)
Monocytes Absolute: 0.3 10*3/uL (ref 0.1–1.0)
Monocytes Relative: 6 %
Neutro Abs: 4.4 10*3/uL (ref 1.7–7.7)
Neutrophils Relative %: 74 %
Platelets: 160 10*3/uL (ref 150–400)
RBC: 3.86 MIL/uL — ABNORMAL LOW (ref 4.22–5.81)
RDW: 22.7 % — ABNORMAL HIGH (ref 11.5–15.5)
WBC: 6 10*3/uL (ref 4.0–10.5)
nRBC: 0 % (ref 0.0–0.2)

## 2020-04-11 LAB — T3, FREE: T3, Free: 2.6 pg/mL (ref 2.0–4.4)

## 2020-04-11 LAB — BASIC METABOLIC PANEL
Anion gap: 10 (ref 5–15)
BUN: 14 mg/dL (ref 8–23)
CO2: 21 mmol/L — ABNORMAL LOW (ref 22–32)
Calcium: 8.8 mg/dL — ABNORMAL LOW (ref 8.9–10.3)
Chloride: 108 mmol/L (ref 98–111)
Creatinine, Ser: 1.2 mg/dL (ref 0.61–1.24)
GFR, Estimated: >60 mL/min (ref >60)
Glucose, Bld: 88 mg/dL (ref 70–99)
Potassium: 4.2 mmol/L (ref 3.5–5.1)
Sodium: 139 mmol/L (ref 135–145)

## 2020-04-11 LAB — CALCIUM, IONIZED: Calcium, Ionized, Serum: 4.8 mg/dL (ref 4.5–5.6)

## 2020-04-11 SURGERY — EGD (ESOPHAGOGASTRODUODENOSCOPY)
Anesthesia: General

## 2020-04-11 MED ORDER — PROPOFOL 500 MG/50ML IV EMUL
INTRAVENOUS | Status: AC
  Filled 2020-04-11: qty 100

## 2020-04-11 MED ORDER — PROPOFOL 10 MG/ML IV BOLUS
INTRAVENOUS | Status: AC
  Filled 2020-04-11: qty 40

## 2020-04-11 MED ORDER — PROPOFOL 500 MG/50ML IV EMUL: INTRAVENOUS | Status: DC | PRN

## 2020-04-11 MED ORDER — SODIUM CHLORIDE 0.9 % IV SOLN: INTRAVENOUS | Status: DC

## 2020-04-11 MED ORDER — PROPOFOL 500 MG/50ML IV EMUL
INTRAVENOUS | Status: DC | PRN
  Administered 2020-04-11: 20 mg via INTRAVENOUS
  Administered 2020-04-11: 30 mg via INTRAVENOUS
  Administered 2020-04-11: 20 mg via INTRAVENOUS
  Administered 2020-04-11: 125 ug/kg/min via INTRAVENOUS
  Administered 2020-04-11: 20 mg via INTRAVENOUS

## 2020-04-11 MED ORDER — SODIUM CHLORIDE 0.9 % IV SOLN: 750.0000 mg | Freq: Once | INTRAVENOUS | Status: DC

## 2020-04-11 MED ORDER — SODIUM CHLORIDE 0.9 % IV SOLN
300.0000 mg | Freq: Once | INTRAVENOUS | Status: AC
  Administered 2020-04-11: 300 mg via INTRAVENOUS
  Filled 2020-04-11: qty 15

## 2020-04-11 MED ORDER — LIDOCAINE HCL (CARDIAC) PF 100 MG/5ML IV SOSY
PREFILLED_SYRINGE | INTRAVENOUS | Status: DC | PRN
  Administered 2020-04-11: 100 mg via INTRATRACHEAL

## 2020-04-11 MED ORDER — PROPOFOL 10 MG/ML IV BOLUS
INTRAVENOUS | Status: AC
  Filled 2020-04-11: qty 20

## 2020-04-11 NOTE — Transfer of Care (Signed)
Immediate Anesthesia Transfer of Care Note  Patient: Perry Martinez  Procedure(s) Performed: ESOPHAGOGASTRODUODENOSCOPY (EGD) (N/A ) COLONOSCOPY (N/A )  Patient Location: PACU  Anesthesia Type:MAC  Level of Consciousness: awake  Airway & Oxygen Therapy: Patient Spontanous Breathing  Post-op Assessment: Report given to RN  Post vital signs: stable  Last Vitals:  Vitals Value Taken Time  BP    Temp    Pulse    Resp    SpO2      Last Pain:  Vitals:   04/11/20 1153  TempSrc:   PainSc: Asleep         Complications: No complications documented.

## 2020-04-11 NOTE — Anesthesia Preprocedure Evaluation (Addendum)
Anesthesia Evaluation  Patient identified by MRN, date of birth, ID band Patient awake    Reviewed: Allergy & Precautions, H&P , NPO status , reviewed documented beta blocker date and time   Airway Mallampati: II  TM Distance: >3 FB Neck ROM: full    Dental  (+) Poor Dentition, Chipped, Missing, Caps, Dental Advidsory Given Very poor dentition. Denies loose teeth:   Pulmonary sleep apnea and Continuous Positive Airway Pressure Ventilation , Current Smoker and Patient abstained from smoking.,  Cough x one week   Pulmonary exam normal        Cardiovascular hypertension, Pt. on medications Normal cardiovascular exam+ dysrhythmias Atrial Fibrillation   07/2018 ECHO IMPRESSIONS    1. The left ventricle has low normal systolic function, with an ejection  fraction of 50-55%. The cavity size was mildly dilated. There is severely  increased left ventricular wall thickness. Left ventricular diastolic  Doppler parameters are consistent  with impaired relaxation. Left ventricular diffuse hypokinesis.  2. The right ventricle has normal systolic function. The cavity was  normal. There is no increase in right ventricular wall thickness.  3. Left atrial size was mildly dilated.  4. The mitral valve is grossly normal. Mild thickening of the mitral  valve leaflet. Mild calcification of the mitral valve leaflet.  5. The tricuspid valve is grossly normal.  6. The aortic valve is abnormal. Moderate thickening of the aortic valve.  Moderate calcification of the aortic valve. Aortic valve regurgitation was  not assessed by color flow Doppler.    Neuro/Psych PSYCHIATRIC DISORDERS Depression CVA    GI/Hepatic   Endo/Other  Hypothyroidism   Renal/GU Renal disease     Musculoskeletal   Abdominal   Peds  Hematology  (+) Blood dyscrasia, anemia ,   Anesthesia Other Findings Past Medical History: 04/09/2020: Acute blood loss  anemia 08/09/2018: Acute CVA (cerebrovascular accident) (HCC) 04/09/2020: AKI (acute kidney injury) (HCC) No date: Atrial fibrillation (HCC) No date: Depression No date: Hyperlipidemia No date: Hypertension 06/28/2016: Hypothyroidism No date: Substance abuse (HCC)     Comment:  IV drugs when younger  Past Surgical History: No date: FRACTURE SURGERY     Comment:  right foot No date: ROTATOR CUFF REPAIR     Comment:  right shoulder  BMI    Body Mass Index: 26.64 kg/m      Reproductive/Obstetrics                           Anesthesia Physical Anesthesia Plan  ASA: III  Anesthesia Plan: General   Post-op Pain Management:    Induction: Intravenous  PONV Risk Score and Plan: Treatment may vary due to age or medical condition and TIVA  Airway Management Planned: Nasal Cannula and Natural Airway  Additional Equipment:   Intra-op Plan:   Post-operative Plan:   Informed Consent: I have reviewed the patients History and Physical, chart, labs and discussed the procedure including the risks, benefits and alternatives for the proposed anesthesia with the patient or authorized representative who has indicated his/her understanding and acceptance.     Dental Advisory Given  Plan Discussed with: CRNA  Anesthesia Plan Comments: (Educatioin re: OSA & TIVA done. Advised to use CPAP tonight)       Anesthesia Quick Evaluation

## 2020-04-11 NOTE — Progress Notes (Addendum)
Progress Note    Perry Martinez  ZLD:[id number] DOB: 11-Jan-1956  DOA: 04/09/2020 PCP: Sherron Monday, MD      Brief Narrative:    Medical records reviewed and are as summarized below:  Perry Martinez is a 65 y.o. male       Assessment/Plan:   Principal Problem:   Acute upper GI bleed Active Problems:   Tobacco abuse   Hyperlipidemia   Acute blood loss anemia   AKI (acute kidney injury) (HCC)   Atrial fibrillation (HCC)    Body mass index is 26.45 kg/m.    Acute upper GI bleeding: s/p EGD which showed gastritis and a normal esophagus.  S/p colonoscopy on 04/11/2020 which showed diverticulosis in the entire examined colon, one 7 mm polyp in the sigmoid colon and nonbleeding internal hemorrhoids.   Acute blood loss anemia, iron deficiency anemia: s/p 3 units of packed red blood cells.  Give 1 dose of IV iron sucrose.  Monitor H&H.  Paroxysmal atrial fibrillation with history of stroke in June 2020: Continue amiodarone.  Eliquis is still on hold.  Hypothyroidism: TSH was 69, free T4 was 0.41.  Patient denies any history of hypothyroidism.  However, chart review showed his TSH was 19.18 about 3 years ago.  He said he started amiodarone about 1-1/2 years ago.  Case was discussed with his cardiologist, Dr. Jeanie Cooks on 04/10/2020.  He recommended we keep amiodarone for now since hypothyroidism can be easily treated.  He will see him in the office after discharge from the hospital.  Anxiety: Continue diazepam  Chronic diastolic CHF: Compensated.  Suspect patient has underlying CKD stage IIIa: Creatinine is stable.  Continue to monitor.            Diet Order            Diet regular Room service appropriate? Yes; Fluid consistency: Thin  Diet effective now                    Consultants:  Gastroenterologist  Procedures: EGD and colonoscopy on 04/11/2018    Medications:   . amiodarone  200 mg Oral Daily  . gabapentin  100 mg Oral BID  WC  . gabapentin  300 mg Oral QHS  . levothyroxine  50 mcg Oral Q0600  . pantoprazole (PROTONIX) IV  40 mg Intravenous Q12H  . rOPINIRole  0.5 mg Oral QHS  . rosuvastatin  40 mg Oral Daily   Continuous Infusions: . ferric carboxymaltose (INJECTAFER) IVPB       Anti-infectives (From admission, onward)   None             Family Communication/Anticipated D/C date and plan/Code Status   DVT prophylaxis: SCDs Start: 04/09/20 1805     Code Status: Full Code  Family Communication: None Disposition Plan:    Status is: Inpatient  Remains inpatient appropriate because:IV treatments appropriate due to intensity of illness or inability to take PO and Inpatient level of care appropriate due to severity of illness   Dispo: The patient is from: Home              Anticipated d/c is to: Home              Anticipated d/c date is: 2 days              Patient currently is not medically stable to d/c.   Difficult to place patient No  Subjective:   Interval events noted.  No abdominal pain, hematemesis or rectal bleeding.  Objective:    Vitals:   04/11/20 0453 04/11/20 0910 04/11/20 1035 04/11/20 1153  BP: 107/76 117/76 116/75 96/67  Pulse: (!) 58 (!) 57 (!) 58 (!) 57  Resp: 20 19 16 15   Temp: 97.9 F (36.6 C) 98.4 F (36.9 C) (!) 97.2 F (36.2 C)   TempSrc: Oral Oral Temporal   SpO2: 96% 92% 100% 100%  Weight: 89.1 kg  88.5 kg   Height:   6' (1.829 m)    No data found.   Intake/Output Summary (Last 24 hours) at 04/11/2020 1508 Last data filed at 04/11/2020 1355 Gross per 24 hour  Intake 240 ml  Output 0 ml  Net 240 ml   Filed Weights   04/10/20 0640 04/11/20 0453 04/11/20 1035  Weight: 88.6 kg 89.1 kg 88.5 kg    Exam:  GEN: NAD SKIN: Warm and dry EYES: No pallor or icterus ENT: MMM CV: RRR PULM: CTA B ABD: soft, ND, NT, +BS CNS: AAO x 3, non focal EXT: No edema or tenderness       Data Reviewed:   I have personally  reviewed following labs and imaging studies:  Labs: Labs show the following:   Basic Metabolic Panel: Recent Labs  Lab 04/09/20 1229 04/10/20 0437 04/11/20 0916  NA 135 137 139  K 4.6 4.2 4.2  CL 106 108 108  CO2 20* 22 21*  GLUCOSE 115* 91 88  BUN 25* 23 14  CREATININE 1.57* 1.58* 1.20  CALCIUM 8.6* 8.6* 8.8*  MG 2.2 2.3  --    GFR Estimated Creatinine Clearance: 66.5 mL/min (by C-G formula based on SCr of 1.2 mg/dL). Liver Function Tests: Recent Labs  Lab 04/09/20 1229 04/10/20 0437  AST 26 20  ALT 15 16  ALKPHOS 71 60  BILITOT 0.5 0.5  PROT 7.5 6.8  ALBUMIN 4.1 3.6   No results for input(s): LIPASE, AMYLASE in the last 168 hours. No results for input(s): AMMONIA in the last 168 hours. Coagulation profile Recent Labs  Lab 04/09/20 1229 04/10/20 0437  INR 1.6* 1.4*    CBC: Recent Labs  Lab 04/09/20 1229 04/10/20 0437 04/10/20 1041 04/11/20 0916  WBC 4.7 6.0  --  6.0  NEUTROABS  --   --   --  4.4  HGB 5.3* 6.3* 7.5* 8.2*  HCT 20.1* 22.5* 26.2* 28.1*  MCV 68.1* 71.7*  --  72.8*  PLT 186 168  --  160   Cardiac Enzymes: No results for input(s): CKTOTAL, CKMB, CKMBINDEX, TROPONINI in the last 168 hours. BNP (last 3 results) No results for input(s): PROBNP in the last 8760 hours. CBG: No results for input(s): GLUCAP in the last 168 hours. D-Dimer: No results for input(s): DDIMER in the last 72 hours. Hgb A1c: No results for input(s): HGBA1C in the last 72 hours. Lipid Profile: No results for input(s): CHOL, HDL, LDLCALC, TRIG, CHOLHDL, LDLDIRECT in the last 72 hours. Thyroid function studies: Recent Labs    04/10/20 0437  TSH 69.000*  T3FREE 2.6   Anemia work up: Recent Labs    04/09/20 1229  FOLATE 9.3  FERRITIN 5*  TIBC 615*  IRON 17*  RETICCTPCT 2.8   Sepsis Labs: Recent Labs  Lab 04/09/20 1229 04/10/20 0437 04/11/20 0916  WBC 4.7 6.0 6.0    Microbiology Recent Results (from the past 240 hour(s))  Resp Panel by RT-PCR  (Flu A&B, Covid) Nasopharyngeal Swab  Status: None   Collection Time: 04/09/20  3:00 PM   Specimen: Nasopharyngeal Swab; Nasopharyngeal(NP) swabs in vial transport medium  Result Value Ref Range Status   SARS Coronavirus 2 by RT PCR NEGATIVE NEGATIVE Final    Comment: (NOTE) SARS-CoV-2 target nucleic acids are NOT DETECTED.  The SARS-CoV-2 RNA is generally detectable in upper respiratory specimens during the acute phase of infection. The lowest concentration of SARS-CoV-2 viral copies this assay can detect is 138 copies/mL. A negative result does not preclude SARS-Cov-2 infection and should not be used as the sole basis for treatment or other patient management decisions. A negative result may occur with  improper specimen collection/handling, submission of specimen other than nasopharyngeal swab, presence of viral mutation(s) within the areas targeted by this assay, and inadequate number of viral copies(<138 copies/mL). A negative result must be combined with clinical observations, patient history, and epidemiological information. The expected result is Negative.  Fact Sheet for Patients:  BloggerCourse.com  Fact Sheet for Healthcare Providers:  SeriousBroker.it  This test is no t yet approved or cleared by the [place] FDA and  has been authorized for detection and/or diagnosis of SARS-CoV-2 by FDA under an Emergency Use Authorization (EUA). This EUA will remain  in effect (meaning this test can be used) for the duration of the COVID-19 declaration under Section 564(b)(1) of the Act, 21 U.S.C.section 360bbb-3(b)(1), unless the authorization is terminated  or revoked sooner.       Influenza A by PCR NEGATIVE NEGATIVE Final   Influenza B by PCR NEGATIVE NEGATIVE Final    Comment: (NOTE) The Xpert Xpress SARS-CoV-2/FLU/RSV plus assay is intended as an aid in the diagnosis of influenza from Nasopharyngeal swab specimens  and should not be used as a sole basis for treatment. Nasal washings and aspirates are unacceptable for Xpert Xpress SARS-CoV-2/FLU/RSV testing.  Fact Sheet for Patients: BloggerCourse.com  Fact Sheet for Healthcare Providers: SeriousBroker.it  This test is not yet approved or cleared by the [place] FDA and has been authorized for detection and/or diagnosis of SARS-CoV-2 by FDA under an Emergency Use Authorization (EUA). This EUA will remain in effect (meaning this test can be used) for the duration of the COVID-19 declaration under Section 564(b)(1) of the Act, 21 U.S.C. section 360bbb-3(b)(1), unless the authorization is terminated or revoked.  Performed at [facility], [address] [place], [place] [address]     Procedures and diagnostic studies:  No results found.             LOS: 1 day   Shawnice Tilmon  [facility]   Pager on [email or web address]. If 7PM-7AM, please contact night-coverage at [email or web address]     04/11/2020, 3:08 PM

## 2020-04-11 NOTE — Op Note (Signed)
[facility] Gastroenterology Patient Name: Perry Martinez Procedure Date: 04/11/2020 10:30 AM MRN: [id number] Account #: [id number] Date of Birth: 1955/03/04 Admit Type: Inpatient Age: 65 Room: [facility] ENDO ROOM 4 Gender: Male Note Status: Finalized Procedure:             Upper GI endoscopy Indications:           Acute post hemorrhagic anemia, Recent gastrointestinal                         bleeding Providers:             Boykin Nearing. Norma Fredrickson MD, MD Referring MD:          Silas Flood. Ellsworth Lennox, MD (Referring MD) Medicines:             Propofol per Anesthesia Complications:         No immediate complications. Procedure:             Pre-Anesthesia Assessment:                        - The risks and benefits of the procedure and the                         sedation options and risks were discussed with the                         patient. All questions were answered and informed                         consent was obtained.                        - Patient identification and proposed procedure were                         verified prior to the procedure by the nurse. The                         procedure was verified in the procedure room.                        - ASA Grade Assessment: III - A patient with severe                         systemic disease.                        - After reviewing the risks and benefits, the patient                         was deemed in satisfactory condition to undergo the                         procedure.                        After obtaining informed consent, the endoscope was                         passed under direct vision. Throughout the procedure,  the patient's blood pressure, pulse, and oxygen                         saturations were monitored continuously. The Endoscope                         was introduced through the mouth, and advanced to the                         third part of duodenum. The upper GI  endoscopy was                         accomplished without difficulty. The patient tolerated                         the procedure well. Findings:      The esophagus was normal.      Patchy mild inflammation characterized by erythema was found in the       gastric antrum.      The cardia and gastric fundus were normal on retroflexion.      The examined duodenum was normal.      The exam was otherwise without abnormality. Impression:            - Normal esophagus.                        - Gastritis.                        - Normal examined duodenum.                        - The examination was otherwise normal.                        - No specimens collected. Recommendation:        - Proceed with colonoscopy Procedure Code(s):     --- Professional ---                        [phone], Esophagogastroduodenoscopy, flexible,                         transoral; diagnostic, including collection of                         specimen(s) by brushing or washing, when performed                         (separate procedure) Diagnosis Code(s):     --- Professional ---                        K92.2, Gastrointestinal hemorrhage, unspecified                        D62, Acute posthemorrhagic anemia                        K29.70, Gastritis, unspecified, without bleeding CPT copyright 2019 American Medical Association. All rights reserved. The codes documented in this report are preliminary and upon coder review may  be  revised to meet current compliance requirements. Stanton Kidney MD, MD 04/11/2020 11:28:57 AM This report has been signed electronically. Number of Addenda: 0 Note Initiated On: 04/11/2020 10:30 AM Estimated Blood Loss:  Estimated blood loss: none.      [facility]

## 2020-04-11 NOTE — Anesthesia Postprocedure Evaluation (Signed)
Anesthesia Post Note  Patient: Perry Martinez  Procedure(s) Performed: ESOPHAGOGASTRODUODENOSCOPY (EGD) (N/A ) COLONOSCOPY (N/A )  Patient location during evaluation: Endoscopy Anesthesia Type: General Level of consciousness: awake and alert Pain management: pain level controlled Vital Signs Assessment: post-procedure vital signs reviewed and stable Respiratory status: spontaneous breathing, nonlabored ventilation and respiratory function stable Cardiovascular status: blood pressure returned to baseline and stable Postop Assessment: no apparent nausea or vomiting Anesthetic complications: no   No complications documented.   Last Vitals:  Vitals:   04/11/20 1035 04/11/20 1153  BP: 116/75 96/67  Pulse: (!) 58 (!) 57  Resp: 16 15  Temp: (!) 36.2 C   SpO2: 100% 100%    Last Pain:  Vitals:   04/11/20 1344  TempSrc:   PainSc: 5                  Jeannine Pennisi Garry Heater

## 2020-04-11 NOTE — Interval H&P Note (Signed)
History and Physical Interval Note:  04/11/2020 11:14 AM  Perry Martinez  has presented today for surgery, with the diagnosis of anemia.  The various methods of treatment have been discussed with the patient and family. After consideration of risks, benefits and other options for treatment, the patient has consented to  Procedure(s): ESOPHAGOGASTRODUODENOSCOPY (EGD) (N/A) COLONOSCOPY (N/A) as a surgical intervention.  The patient's history has been reviewed, patient examined, no change in status, stable for surgery.  I have reviewed the patient's chart and labs.  Questions were answered to the patient's satisfaction.     [place], [place]

## 2020-04-11 NOTE — Op Note (Signed)
[facility] Gastroenterology Patient Name: Perry Martinez Procedure Date: 04/11/2020 10:27 AM MRN: [id number] Account #: [id number] Date of Birth: 08-30-1955 Admit Type: Inpatient Age: 65 Room: 4 Gender: Male Note Status: Finalized Procedure:             Colonoscopy Indications:           Anal bleeding, Acute post hemorrhagic anemia Providers:             Boykin Nearing. Norma Fredrickson MD, MD Referring MD:          Silas Flood. Ellsworth Lennox, MD (Referring MD) Medicines:             Propofol per Anesthesia Complications:         No immediate complications. Procedure:             Pre-Anesthesia Assessment:                        - The risks and benefits of the procedure and the                         sedation options and risks were discussed with the                         patient. All questions were answered and informed                         consent was obtained.                        - Patient identification and proposed procedure were                         verified prior to the procedure by the nurse. The                         procedure was verified in the procedure room.                        - ASA Grade Assessment: III - A patient with severe                         systemic disease.                        - After reviewing the risks and benefits, the patient                         was deemed in satisfactory condition to undergo the                         procedure.                        After obtaining informed consent, the colonoscope was                         passed under direct vision. Throughout the procedure,                         the patient's blood pressure, pulse, and  oxygen                         saturations were monitored continuously. The                         Colonoscope was introduced through the anus and                         advanced to the the cecum, identified by appendiceal                         orifice and ileocecal valve. The  colonoscopy was                         performed without difficulty. The patient tolerated                         the procedure well. The quality of the bowel                         preparation was good. The ileocecal valve, appendiceal                         orifice, and rectum were photographed. Findings:      The perianal and digital rectal examinations were normal. Pertinent       negatives include normal sphincter tone and no palpable rectal lesions.      Many medium-mouthed diverticula were found in the entire colon.      A 7 mm polyp was found in the sigmoid colon. The polyp was sessile.       Polypectomy not attempted secondary to recent GI bleed. No biopsies or       other specimens were collected for this exam. Estimated blood loss: none.      There is no endoscopic evidence of bleeding, erythema, inflammation,       mass or ulcerations in the entire colon.      Non-bleeding internal hemorrhoids were found during retroflexion. The       hemorrhoids were Grade I (internal hemorrhoids that do not prolapse).      The exam was otherwise without abnormality. Impression:            - Diverticulosis in the entire examined colon.                        - One 7 mm polyp in the sigmoid colon. No specimens                         collected.                        - Non-bleeding internal hemorrhoids.                        - The examination was otherwise normal. Recommendation:        - Resume previous diet.                        - Continue present medications.                        -  Repeat colonoscopy in 6 months polypectomy.                        - Resume Eliquis (apixaban) at prior dose today. Refer                         to managing physician for further adjustment of                         therapy.                        - To visualize the small bowel, perform video capsule                         endoscopy at the next available appointment.                        - Cardiac  diet today. Procedure Code(s):     --- Professional ---                        [id number], Colonoscopy, flexible; diagnostic, including                         collection of specimen(s) by brushing or washing, when                         performed (separate procedure) Diagnosis Code(s):     --- Professional ---                        K57.30, Diverticulosis of large intestine without                         perforation or abscess without bleeding                        D62, Acute posthemorrhagic anemia                        K62.5, Hemorrhage of anus and rectum                        K63.5, Polyp of colon                        K64.0, First degree hemorrhoids CPT copyright 2019 American Medical Association. All rights reserved. The codes documented in this report are preliminary and upon coder review may  be revised to meet current compliance requirements. Stanton Kidney MD, MD 04/11/2020 11:56:25 AM This report has been signed electronically. Number of Addenda: 0 Note Initiated On: 04/11/2020 10:27 AM Scope Withdrawal Time: 0 hours 6 minutes 51 seconds  Total Procedure Duration: 0 hours 13 minutes 2 seconds  Estimated Blood Loss:  Estimated blood loss: none.      [facility]

## 2020-04-12 DIAGNOSIS — I480 Paroxysmal atrial fibrillation: Secondary | ICD-10-CM | POA: Diagnosis not present

## 2020-04-12 DIAGNOSIS — D62 Acute posthemorrhagic anemia: Secondary | ICD-10-CM | POA: Diagnosis not present

## 2020-04-12 DIAGNOSIS — K922 Gastrointestinal hemorrhage, unspecified: Secondary | ICD-10-CM | POA: Diagnosis not present

## 2020-04-12 LAB — CBC WITH DIFFERENTIAL/PLATELET
Abs Immature Granulocytes: 0.02 10*3/uL (ref 0.00–0.07)
Basophils Absolute: 0 10*3/uL (ref 0.0–0.1)
Basophils Relative: 0 %
Eosinophils Absolute: 0.1 10*3/uL (ref 0.0–0.5)
Eosinophils Relative: 2 %
HCT: 26.2 % — ABNORMAL LOW (ref 39.0–52.0)
Hemoglobin: 7.3 g/dL — ABNORMAL LOW (ref 13.0–17.0)
Immature Granulocytes: 0 %
Lymphocytes Relative: 20 %
Lymphs Abs: 1.2 10*3/uL (ref 0.7–4.0)
MCH: 20.6 pg — ABNORMAL LOW (ref 26.0–34.0)
MCHC: 27.9 g/dL — ABNORMAL LOW (ref 30.0–36.0)
MCV: 73.8 fL — ABNORMAL LOW (ref 80.0–100.0)
Monocytes Absolute: 0.4 10*3/uL (ref 0.1–1.0)
Monocytes Relative: 7 %
Neutro Abs: 4.3 10*3/uL (ref 1.7–7.7)
Neutrophils Relative %: 71 %
Platelets: 155 10*3/uL (ref 150–400)
RBC: 3.55 MIL/uL — ABNORMAL LOW (ref 4.22–5.81)
RDW: 23.1 % — ABNORMAL HIGH (ref 11.5–15.5)
WBC: 6 10*3/uL (ref 4.0–10.5)
nRBC: 0 % (ref 0.0–0.2)

## 2020-04-12 LAB — BASIC METABOLIC PANEL
Anion gap: 8 (ref 5–15)
BUN: 18 mg/dL (ref 8–23)
CO2: 22 mmol/L (ref 22–32)
Calcium: 8.6 mg/dL — ABNORMAL LOW (ref 8.9–10.3)
Chloride: 107 mmol/L (ref 98–111)
Creatinine, Ser: 1.33 mg/dL — ABNORMAL HIGH (ref 0.61–1.24)
GFR, Estimated: 59 mL/min — ABNORMAL LOW (ref >60)
Glucose, Bld: 89 mg/dL (ref 70–99)
Potassium: 3.8 mmol/L (ref 3.5–5.1)
Sodium: 137 mmol/L (ref 135–145)

## 2020-04-12 LAB — HEMOGLOBIN AND HEMATOCRIT, BLOOD
HCT: 29.8 % — ABNORMAL LOW (ref 39.0–52.0)
Hemoglobin: 8.5 g/dL — ABNORMAL LOW (ref 13.0–17.0)

## 2020-04-12 LAB — PREPARE RBC (CROSSMATCH)

## 2020-04-12 MED ORDER — SODIUM CHLORIDE 0.9% IV SOLUTION: Freq: Once | INTRAVENOUS | Status: AC

## 2020-04-12 MED ORDER — LEVOTHYROXINE SODIUM 50 MCG PO TABS: 50.0000 ug | ORAL_TABLET | Freq: Every day | ORAL | 0 refills | Status: DC

## 2020-04-12 NOTE — Discharge Summary (Addendum)
Physician Discharge Summary  Perry Martinez JHE:[id number] DOB: 06-02-1955 DOA: 04/09/2020  PCP: Sherron Monday, MD  Admit date: 04/09/2020 Discharge date: 04/13/2020  Discharge disposition: Home   Recommendations for Outpatient Follow-Up:   Follow-up with cardiologist, Dr. Welton Flakes, on 04/15/2020 Follow-up with gastroenterologist, Dr. Norma Fredrickson, in 3 weeks.   Discharge Diagnosis:   Principal Problem:   Acute upper GI bleed Active Problems:   Tobacco abuse   Hyperlipidemia   Acute blood loss anemia   AKI (acute kidney injury) (HCC)   Atrial fibrillation (HCC)    Discharge Condition: Stable.  Diet recommendation:  Diet Order            Diet - low sodium heart healthy                   Code Status: Prior     Hospital Course:   Perry Martinez is a 65 year old man with medical history of paroxysmal atrial fibrillation, stroke, chronic diastolic CHF, who presented to the hospital because of low hemoglobin of 5.5. He complained of black tarry stools, fatigue, exertional shortness of breath and generalized weakness. Symptoms have been going on for about 2 to 3 months but symptoms are progressively worsened.  He had done blood work with a plan of seeing his primary care physician as an outpatient. However, blood work revealed hemoglobin of 5.5 so he was referred to the ED for further management.  Hemoglobin was 5.3 at the hospital. He was admitted to the hospital for acute on chronic blood loss anemia. He was transfused with 4 units of packed red blood cells. Posttransfusion hemoglobin remained stable. He was also given 1 dose of IV iron sucrose. He was seen in consultation by the gastroenterologist. EGD and colonoscopy were performed. EGD showed gastritis and a normal esophagus. Colonoscopy showed diverticulosis, one 7 mm polyp polyp in the sigmoid colon and nonbleeding internal hemorrhoids. Polypectomy was not performed but repeat colonoscopy was recommended in 6 months.   Exact cause of bleeding is not clear.  Case was discussed with Dr. Norma Fredrickson, gastroenterologist, via secure chart. From his standpoint, patient could be discharged home with GI follow-up with plans to perform capsule endoscopy as an outpatient.  Patient was also found to have hypothyroidism. TSH was 69 and free T4 was 0.41. Free T3 was normal. Patient does not recall history of hypothyroidism. However, chart review showed that his TSH was 19.18 about 3 years ago. He was taking amiodarone that was started about 1 and half years ago. Case was discussed with his cardiologist, Dr. Jeanie Cooks, who said it was okay to continue amiodarone. He plans to see him as an outpatient soon after discharge from the hospital.      Medical Consultants:    Gastroenterologist   Discharge Exam:    Vitals:   04/12/20 1232 04/12/20 1236 04/12/20 1539 04/12/20 1617  BP:  124/88 127/87 127/77  Pulse:  (!) 58 67 (!) 58  Resp:  17 19 18   Temp: 97.6 F (36.4 C)  97.7 F (36.5 C) 97.7 F (36.5 C)  TempSrc: Oral  Oral Oral  SpO2:  99% 99% 98%  Weight:      Height:         GEN: NAD SKIN: Warm and dry EYES: No pallor or icterus ENT: MMM CV: RRR PULM: CTA B ABD: soft, ND, NT, +BS CNS: AAO x 3, non focal EXT: No edema or tenderness   The results of significant diagnostics from this hospitalization (including  imaging, microbiology, ancillary and laboratory) are listed below for reference.     Procedures and Diagnostic Studies:   No results found.   Labs:   Basic Metabolic Panel: Recent Labs  Lab 04/09/20 1229 04/10/20 0437 04/11/20 0916 04/12/20 0410  NA 135 137 139 137  K 4.6 4.2 4.2 3.8  CL 106 108 108 107  CO2 20* 22 21* 22  GLUCOSE 115* 91 88 89  BUN 25* 23 14 18   CREATININE 1.57* 1.58* 1.20 1.33*  CALCIUM 8.6* 8.6* 8.8* 8.6*  MG 2.2 2.3  --   --    GFR Estimated Creatinine Clearance: 60 mL/min (A) (by C-G formula based on SCr of 1.33 mg/dL (H)). Liver Function Tests: Recent  Labs  Lab 04/09/20 1229 04/10/20 0437  AST 26 20  ALT 15 16  ALKPHOS 71 60  BILITOT 0.5 0.5  PROT 7.5 6.8  ALBUMIN 4.1 3.6   No results for input(s): LIPASE, AMYLASE in the last 168 hours. No results for input(s): AMMONIA in the last 168 hours. Coagulation profile Recent Labs  Lab 04/09/20 1229 04/10/20 0437  INR 1.6* 1.4*    CBC: Recent Labs  Lab 04/09/20 1229 04/10/20 0437 04/10/20 1041 04/11/20 0916 04/12/20 0410 04/12/20 1602  WBC 4.7 6.0  --  6.0 6.0  --   NEUTROABS  --   --   --  4.4 4.3  --   HGB 5.3* 6.3* 7.5* 8.2* 7.3* 8.5*  HCT 20.1* 22.5* 26.2* 28.1* 26.2* 29.8*  MCV 68.1* 71.7*  --  72.8* 73.8*  --   PLT 186 168  --  160 155  --    Cardiac Enzymes: No results for input(s): CKTOTAL, CKMB, CKMBINDEX, TROPONINI in the last 168 hours. BNP: Invalid input(s): POCBNP CBG: No results for input(s): GLUCAP in the last 168 hours. D-Dimer No results for input(s): DDIMER in the last 72 hours. Hgb A1c No results for input(s): HGBA1C in the last 72 hours. Lipid Profile No results for input(s): CHOL, HDL, LDLCALC, TRIG, CHOLHDL, LDLDIRECT in the last 72 hours. Thyroid function studies No results for input(s): TSH, T4TOTAL, T3FREE, THYROIDAB in the last 72 hours.  Invalid input(s): FREET3 Anemia work up No results for input(s): VITAMINB12, FOLATE, FERRITIN, TIBC, IRON, RETICCTPCT in the last 72 hours. Microbiology Recent Results (from the past 240 hour(s))  Resp Panel by RT-PCR (Flu A&B, Covid) Nasopharyngeal Swab     Status: None   Collection Time: 04/09/20  3:00 PM   Specimen: Nasopharyngeal Swab; Nasopharyngeal(NP) swabs in vial transport medium  Result Value Ref Range Status   SARS Coronavirus 2 by RT PCR NEGATIVE NEGATIVE Final    Comment: (NOTE) SARS-CoV-2 target nucleic acids are NOT DETECTED.  The SARS-CoV-2 RNA is generally detectable in upper respiratory specimens during the acute phase of infection. The lowest concentration of SARS-CoV-2 viral  copies this assay can detect is 138 copies/mL. A negative result does not preclude SARS-Cov-2 infection and should not be used as the sole basis for treatment or other patient management decisions. A negative result may occur with  improper specimen collection/handling, submission of specimen other than nasopharyngeal swab, presence of viral mutation(s) within the areas targeted by this assay, and inadequate number of viral copies(<138 copies/mL). A negative result must be combined with clinical observations, patient history, and epidemiological information. The expected result is Negative.  Fact Sheet for Patients:  04/11/20  Fact Sheet for Healthcare Providers:  BloggerCourse.com  This test is no t yet approved or cleared by the  [place] FDA and  has been authorized for detection and/or diagnosis of SARS-CoV-2 by FDA under an TEFL teachermergency Use Authorization (EUA). This EUA will remain  in effect (meaning this test can be used) for the duration of the COVID-19 declaration under Section 564(b)(1) of the Act, 21 U.S.C.section 360bbb-3(b)(1), unless the authorization is terminated  or revoked sooner.       Influenza A by PCR NEGATIVE NEGATIVE Final   Influenza B by PCR NEGATIVE NEGATIVE Final    Comment: (NOTE) The Xpert Xpress SARS-CoV-2/FLU/RSV plus assay is intended as an aid in the diagnosis of influenza from Nasopharyngeal swab specimens and should not be used as a sole basis for treatment. Nasal washings and aspirates are unacceptable for Xpert Xpress SARS-CoV-2/FLU/RSV testing.  Fact Sheet for Patients: BloggerCourse.comhttps://www.fda.gov/media/152166/download  Fact Sheet for Healthcare Providers: SeriousBroker.ithttps://www.fda.gov/media/152162/download  This test is not yet approved or cleared by the [place] FDA and has been authorized for detection and/or diagnosis of SARS-CoV-2 by FDA under an Emergency Use Authorization (EUA). This  EUA will remain in effect (meaning this test can be used) for the duration of the COVID-19 declaration under Section 564(b)(1) of the Act, 21 U.S.C. section 360bbb-3(b)(1), unless the authorization is terminated or revoked.  Performed at [facility], [address] [place], [place] [address]      Discharge Instructions:   Discharge Instructions    Diet - low sodium heart healthy   Complete by: As directed    Discharge instructions   Complete by: As directed    Follow up with Dr. Welton FlakesKhan, cardiologist, for advice on when to resume Eliquis   Increase activity slowly   Complete by: As directed      Allergies as of 04/12/2020   No Known Allergies     Medication List    STOP taking these medications   apixaban 5 MG Tabs tablet Commonly known as: Eliquis     TAKE these medications   amiodarone 200 MG tablet Commonly known as: PACERONE Take 200 mg by mouth daily.   diazepam 10 MG tablet Commonly known as: VALIUM Take 10 mg by mouth at bedtime.   furosemide 20 MG tablet Commonly known as: LASIX Take 20 mg by mouth daily as needed.   gabapentin 300 MG capsule Commonly known as: NEURONTIN Take 300 mg by mouth See admin instructions. Take 1 in the morning, 1 in evening and 3 at bedtime   levothyroxine 50 MCG tablet Commonly known as: SYNTHROID Take 1 tablet (50 mcg total) by mouth daily at 6 (six) AM.   metoprolol succinate 50 MG 24 hr tablet Commonly known as: Toprol XL Take 1 tablet (50 mg total) by mouth daily. Take with or immediately following a meal.   rOPINIRole 0.25 MG tablet Commonly known as: REQUIP Take 0.5 mg by mouth at bedtime.   rosuvastatin 40 MG tablet Commonly known as: CRESTOR Take 40 mg by mouth daily.       Follow-up Information    Laurier NancyKhan, Shaukat A, MD. Schedule an appointment as soon as possible for a visit on 04/15/2020.   Specialty: Cardiology Contact information: [address] [place] [phone]         Stanton Kidneyoledo, Teodoro K, MD. Schedule an appointment as soon as possible for a visit in 3 week(s).   Specialty: Gastroenterology Contact information: [address] [place] [address] [phone]                Time coordinating discharge: 35 minutes  Signed:  Lurene Shadow  [facility] 04/13/2020, 4:43 PM   Pager on [email or web address]. If 7PM-7AM, please contact night-coverage at [email or web address]

## 2020-04-12 NOTE — Progress Notes (Signed)
Discharge instructions reviewed, Rx given, patient understands, IV removed, patient discharge and transported via wheelchair to private vehicle.

## 2020-04-13 ENCOUNTER — Encounter: Payer: Self-pay | Admitting: Internal Medicine

## 2020-04-13 LAB — BPAM RBC
Blood Product Expiration Date: 202202222359
Blood Product Expiration Date: 202202242359
Blood Product Expiration Date: 202203222359
Blood Product Expiration Date: 202203232359
ISSUE DATE / TIME: 202202171531
ISSUE DATE / TIME: 202202172317
ISSUE DATE / TIME: 202202180619
ISSUE DATE / TIME: 202202201203
Unit Type and Rh: 5100
Unit Type and Rh: 5100
Unit Type and Rh: 9500
Unit Type and Rh: 9500

## 2020-04-13 LAB — TYPE AND SCREEN
ABO/RH(D): O NEG
Antibody Screen: NEGATIVE
Unit division: 0
Unit division: 0
Unit division: 0
Unit division: 0

## 2020-04-15 LAB — METHYLMALONIC ACID, SERUM: Methylmalonic Acid, Quantitative: 467 nmol/L — ABNORMAL HIGH (ref 0–378)

## 2020-05-22 DIAGNOSIS — J449 Chronic obstructive pulmonary disease, unspecified: Secondary | ICD-10-CM | POA: Diagnosis not present

## 2020-05-25 DIAGNOSIS — I10 Essential (primary) hypertension: Secondary | ICD-10-CM | POA: Diagnosis not present

## 2020-05-25 DIAGNOSIS — E782 Mixed hyperlipidemia: Secondary | ICD-10-CM | POA: Diagnosis not present

## 2020-05-25 DIAGNOSIS — E039 Hypothyroidism, unspecified: Secondary | ICD-10-CM | POA: Diagnosis not present

## 2020-05-26 DIAGNOSIS — M25461 Effusion, right knee: Secondary | ICD-10-CM | POA: Diagnosis not present

## 2020-05-26 DIAGNOSIS — E039 Hypothyroidism, unspecified: Secondary | ICD-10-CM | POA: Diagnosis not present

## 2020-05-26 DIAGNOSIS — N189 Chronic kidney disease, unspecified: Secondary | ICD-10-CM | POA: Diagnosis not present

## 2020-05-26 DIAGNOSIS — I639 Cerebral infarction, unspecified: Secondary | ICD-10-CM | POA: Diagnosis not present

## 2020-05-26 DIAGNOSIS — M25572 Pain in left ankle and joints of left foot: Secondary | ICD-10-CM | POA: Diagnosis not present

## 2020-05-26 DIAGNOSIS — B351 Tinea unguium: Secondary | ICD-10-CM | POA: Diagnosis not present

## 2020-05-26 DIAGNOSIS — G2581 Restless legs syndrome: Secondary | ICD-10-CM | POA: Diagnosis not present

## 2020-06-03 DIAGNOSIS — M7041 Prepatellar bursitis, right knee: Secondary | ICD-10-CM | POA: Diagnosis not present

## 2020-06-10 DIAGNOSIS — J449 Chronic obstructive pulmonary disease, unspecified: Secondary | ICD-10-CM | POA: Diagnosis not present

## 2020-06-16 DIAGNOSIS — G4733 Obstructive sleep apnea (adult) (pediatric): Secondary | ICD-10-CM

## 2020-06-16 DIAGNOSIS — Z01818 Encounter for other preprocedural examination: Secondary | ICD-10-CM | POA: Diagnosis not present

## 2020-06-16 DIAGNOSIS — Z720 Tobacco use: Secondary | ICD-10-CM | POA: Diagnosis not present

## 2020-06-16 DIAGNOSIS — D62 Acute posthemorrhagic anemia: Secondary | ICD-10-CM | POA: Diagnosis not present

## 2020-06-16 DIAGNOSIS — R030 Elevated blood-pressure reading, without diagnosis of hypertension: Secondary | ICD-10-CM | POA: Diagnosis not present

## 2020-06-16 DIAGNOSIS — E559 Vitamin D deficiency, unspecified: Secondary | ICD-10-CM | POA: Diagnosis not present

## 2020-06-16 DIAGNOSIS — E785 Hyperlipidemia, unspecified: Secondary | ICD-10-CM | POA: Diagnosis not present

## 2020-06-16 DIAGNOSIS — I480 Paroxysmal atrial fibrillation: Secondary | ICD-10-CM | POA: Diagnosis not present

## 2020-06-16 DIAGNOSIS — Z9989 Dependence on other enabling machines and devices: Secondary | ICD-10-CM | POA: Diagnosis not present

## 2020-06-16 DIAGNOSIS — I63421 Cerebral infarction due to embolism of right anterior cerebral artery: Secondary | ICD-10-CM | POA: Diagnosis not present

## 2020-06-16 DIAGNOSIS — E039 Hypothyroidism, unspecified: Secondary | ICD-10-CM | POA: Diagnosis not present

## 2020-06-16 HISTORY — DX: Obstructive sleep apnea (adult) (pediatric): G47.33

## 2020-06-16 HISTORY — DX: Dependence on other enabling machines and devices: Z99.89

## 2020-06-21 DIAGNOSIS — J449 Chronic obstructive pulmonary disease, unspecified: Secondary | ICD-10-CM | POA: Diagnosis not present

## 2020-06-26 DIAGNOSIS — Z9989 Dependence on other enabling machines and devices: Secondary | ICD-10-CM | POA: Diagnosis not present

## 2020-06-26 DIAGNOSIS — K922 Gastrointestinal hemorrhage, unspecified: Secondary | ICD-10-CM | POA: Diagnosis not present

## 2020-06-26 DIAGNOSIS — F17210 Nicotine dependence, cigarettes, uncomplicated: Secondary | ICD-10-CM | POA: Diagnosis not present

## 2020-06-26 DIAGNOSIS — G4730 Sleep apnea, unspecified: Secondary | ICD-10-CM | POA: Diagnosis not present

## 2020-06-26 DIAGNOSIS — K5520 Angiodysplasia of colon without hemorrhage: Secondary | ICD-10-CM | POA: Diagnosis not present

## 2020-06-26 DIAGNOSIS — Z7901 Long term (current) use of anticoagulants: Secondary | ICD-10-CM | POA: Diagnosis not present

## 2020-06-26 DIAGNOSIS — Z8673 Personal history of transient ischemic attack (TIA), and cerebral infarction without residual deficits: Secondary | ICD-10-CM | POA: Diagnosis not present

## 2020-06-26 DIAGNOSIS — K921 Melena: Secondary | ICD-10-CM | POA: Diagnosis not present

## 2020-06-26 DIAGNOSIS — I480 Paroxysmal atrial fibrillation: Secondary | ICD-10-CM | POA: Diagnosis not present

## 2020-06-26 DIAGNOSIS — Z79899 Other long term (current) drug therapy: Secondary | ICD-10-CM | POA: Diagnosis not present

## 2020-06-26 DIAGNOSIS — E039 Hypothyroidism, unspecified: Secondary | ICD-10-CM | POA: Diagnosis not present

## 2020-06-26 DIAGNOSIS — I10 Essential (primary) hypertension: Secondary | ICD-10-CM | POA: Diagnosis not present

## 2020-06-26 DIAGNOSIS — R933 Abnormal findings on diagnostic imaging of other parts of digestive tract: Secondary | ICD-10-CM | POA: Diagnosis not present

## 2020-06-26 DIAGNOSIS — K31819 Angiodysplasia of stomach and duodenum without bleeding: Secondary | ICD-10-CM | POA: Diagnosis not present

## 2020-06-26 DIAGNOSIS — E785 Hyperlipidemia, unspecified: Secondary | ICD-10-CM | POA: Diagnosis not present

## 2020-06-26 DIAGNOSIS — Z8719 Personal history of other diseases of the digestive system: Secondary | ICD-10-CM | POA: Diagnosis not present

## 2020-06-26 DIAGNOSIS — G2581 Restless legs syndrome: Secondary | ICD-10-CM | POA: Diagnosis not present

## 2020-06-26 DIAGNOSIS — Z9889 Other specified postprocedural states: Secondary | ICD-10-CM | POA: Diagnosis not present

## 2020-06-26 DIAGNOSIS — R54 Age-related physical debility: Secondary | ICD-10-CM | POA: Diagnosis not present

## 2020-06-26 DIAGNOSIS — D509 Iron deficiency anemia, unspecified: Secondary | ICD-10-CM | POA: Diagnosis not present

## 2020-07-02 DIAGNOSIS — J449 Chronic obstructive pulmonary disease, unspecified: Secondary | ICD-10-CM | POA: Diagnosis not present

## 2020-07-13 DIAGNOSIS — E039 Hypothyroidism, unspecified: Secondary | ICD-10-CM | POA: Diagnosis not present

## 2020-07-14 DIAGNOSIS — M25572 Pain in left ankle and joints of left foot: Secondary | ICD-10-CM | POA: Diagnosis not present

## 2020-07-14 DIAGNOSIS — M25461 Effusion, right knee: Secondary | ICD-10-CM | POA: Diagnosis not present

## 2020-07-14 DIAGNOSIS — I639 Cerebral infarction, unspecified: Secondary | ICD-10-CM | POA: Diagnosis not present

## 2020-07-14 DIAGNOSIS — E039 Hypothyroidism, unspecified: Secondary | ICD-10-CM | POA: Diagnosis not present

## 2020-07-14 DIAGNOSIS — B351 Tinea unguium: Secondary | ICD-10-CM | POA: Diagnosis not present

## 2020-07-14 DIAGNOSIS — N189 Chronic kidney disease, unspecified: Secondary | ICD-10-CM | POA: Diagnosis not present

## 2020-07-14 DIAGNOSIS — G2581 Restless legs syndrome: Secondary | ICD-10-CM | POA: Diagnosis not present

## 2020-07-22 DIAGNOSIS — J449 Chronic obstructive pulmonary disease, unspecified: Secondary | ICD-10-CM | POA: Diagnosis not present

## 2020-07-25 IMAGING — MR MRI HEAD WITHOUT CONTRAST
8 of 10 series · 38 of 48 positions shown · IV contrast (gadavist)
Comparison: 08/09/2018 CT head.

CLINICAL DATA: 63 y/o M; history of atrial fibrillation. Patient
presents to ED with slurred speech, left arm weakness, numbness,
difficulty walking, and incoordination.

EXAM:
MRI HEAD WITHOUT CONTRAST
MRA NECK WITHOUT AND WITH CONTRAST
TECHNIQUE: Multiplanar, multiecho pulse sequences of the brain and surrounding
structures were obtained without intravenous contrast. Angiographic
images of the neck were obtained using MRA technique with and
without intravenous contrast. Carotid stenosis measurements (when
applicable) are obtained utilizing NASCET criteria, using the distal
internal carotid diameter as the denominator.
CONTRAST:  9 cc Gadavist.

[Series 2: ax dwi_tracew · axial · 3.0mm · 0.83mm/px · z∈[-6,+149]mm · 7 of 55 slices shown]
[im 1/55]
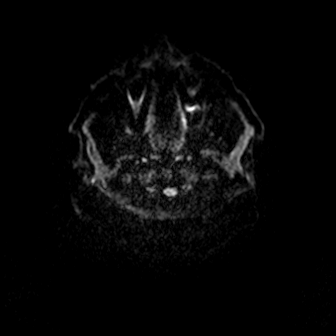
[im 10/55]
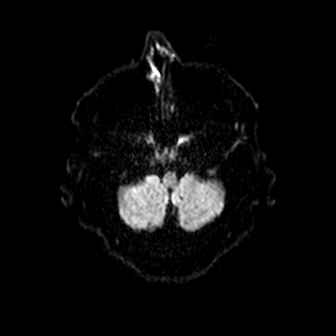
[im 19/55]
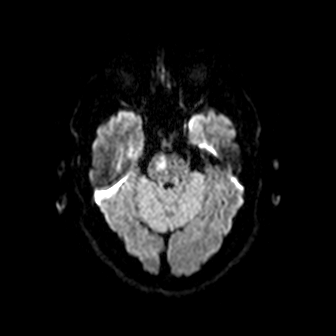
[im 28/55]
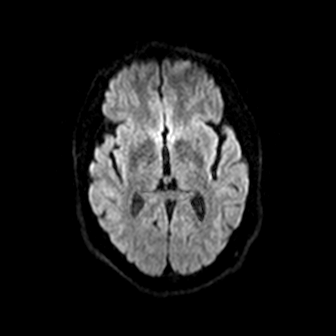
[im 37/55]
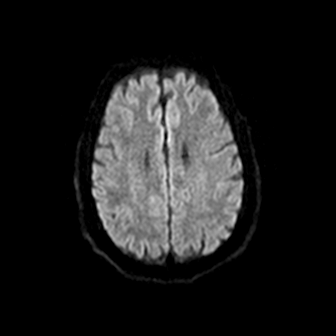
[im 46/55]
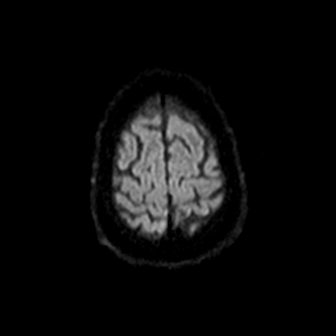
[im 55/55]
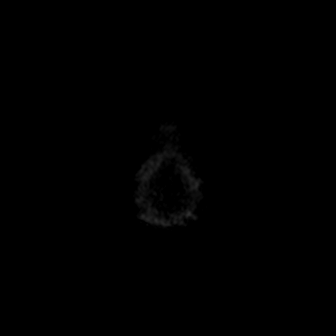

[Series 3: ax dwi_adc · axial · 3.0mm · 0.83mm/px · z∈[-6,+149]mm · 7 of 54 slices shown]
[im 1/54]
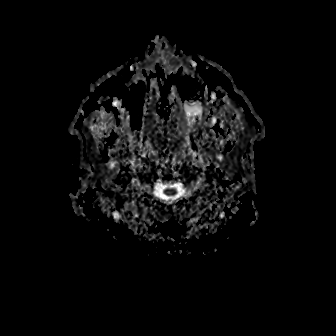
[im 9/54]
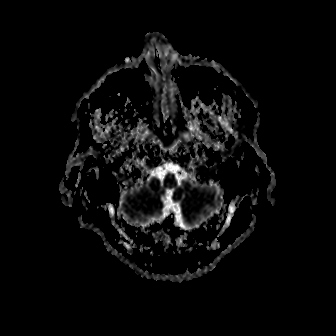
[im 18/54]
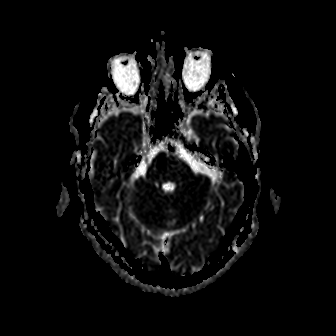
[im 27/54]
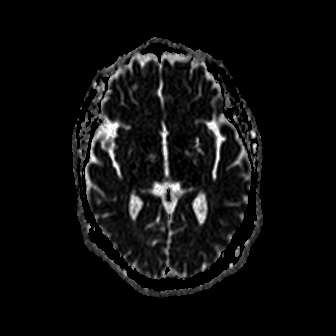
[im 36/54]
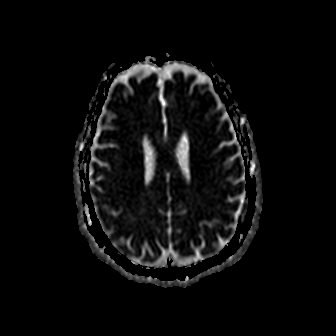
[im 45/54]
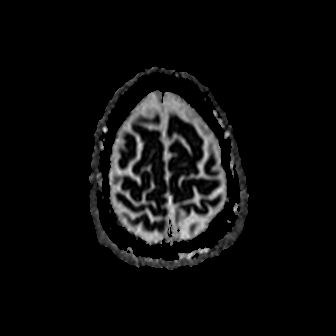
[im 54/54]
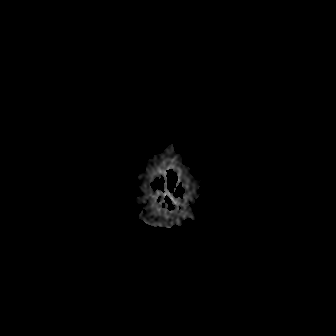

[Series 4: cor dwi_tracew · coronal · 5.0mm · 0.68mm/px · 4 of 38 slices shown]
[im 1/38]
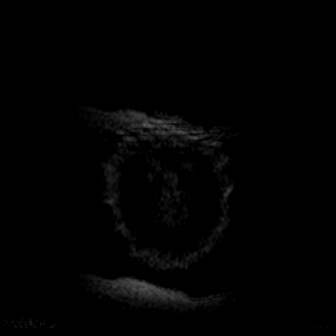
[im 10/38]
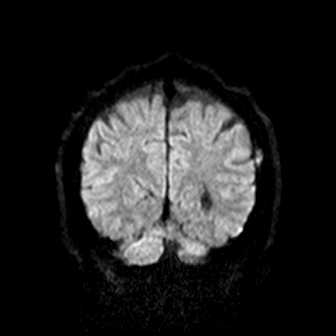
[im 19/38]
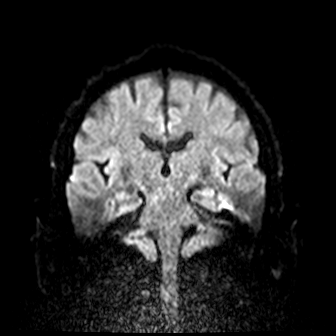
[im 28/38]
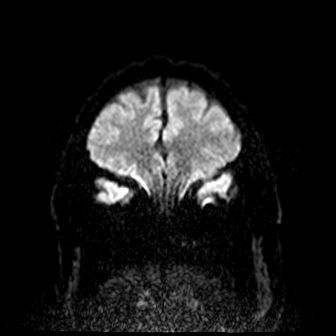

[Series 6: T1 · sagittal · 5.0mm · 0.94mm/px · 3 of 23 slices shown (1 of 2)]
[im 1/23]
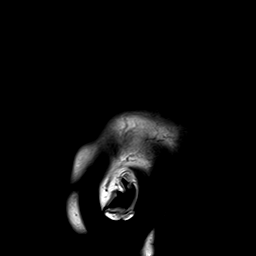
[im 12/23]
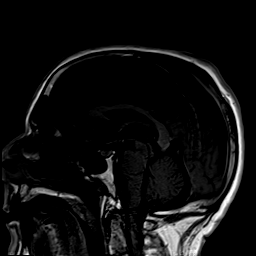
[im 23/23]
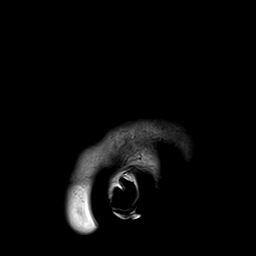

[Series 7: T2 · axial · 5.0mm · 0.45mm/px · z∈[-7,+143]mm · 4 of 27 slices shown (1 of 2)]
[im 1/27]
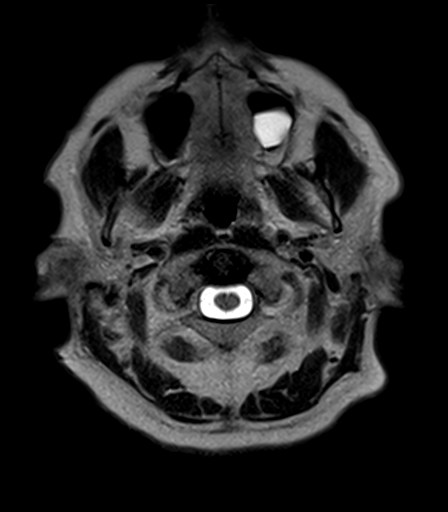
[im 9/27]
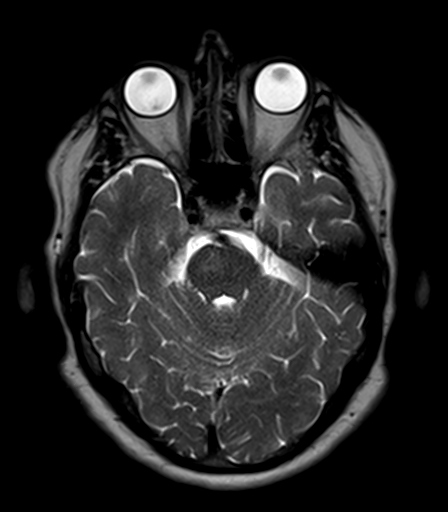
[im 18/27]
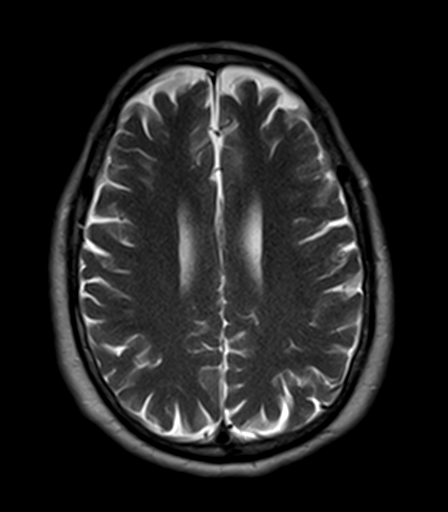
[im 27/27]
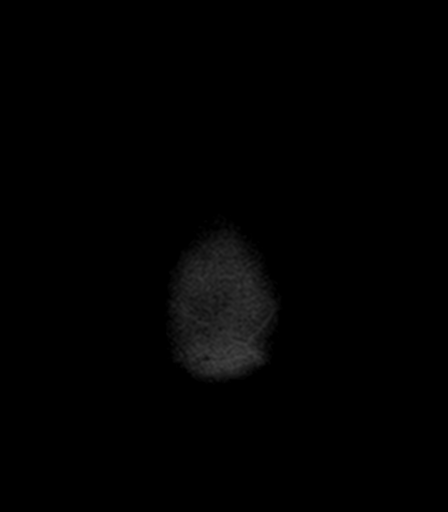

[Series 9: FLAIR · axial · 5.0mm · 1.20mm/px · z∈[-8,+142]mm · 4 of 27 slices shown]
[im 1/27]
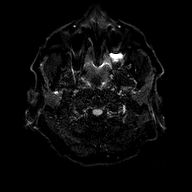
[im 9/27]
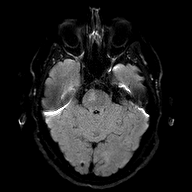
[im 18/27]
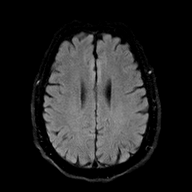
[im 27/27]
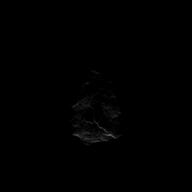

[Series 10: T1 · axial · 5.0mm · 0.90mm/px · z∈[-7,+142]mm · 4 of 27 slices shown (2 of 2)]
[im 1/27]
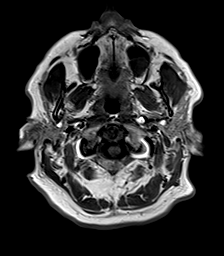
[im 9/27]
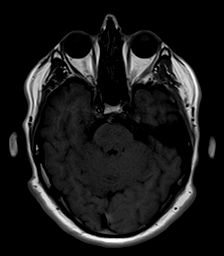
[im 18/27]
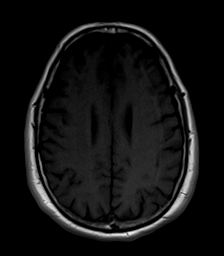
[im 27/27]
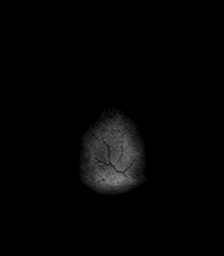

[Series 11: T2 · coronal · 5.0mm · 0.45mm/px · 5 of 33 slices shown (2 of 2)]
[im 1/33]
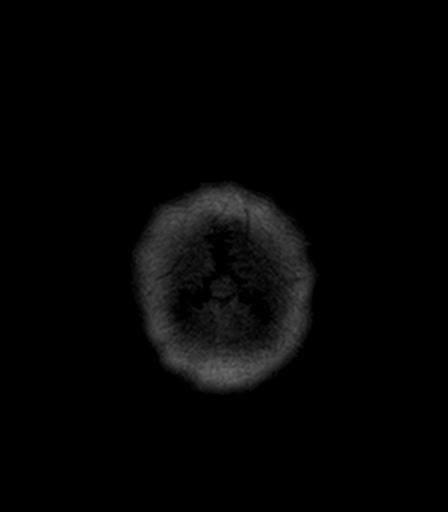
[im 9/33]
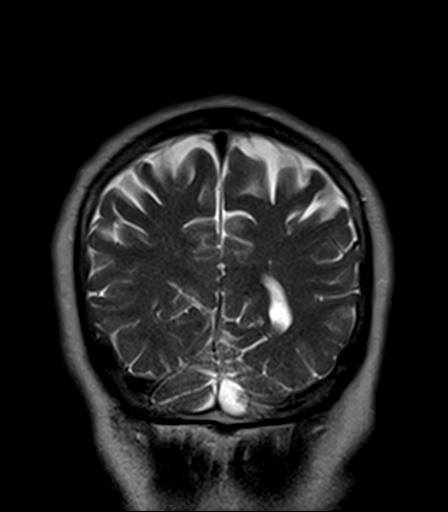
[im 17/33]
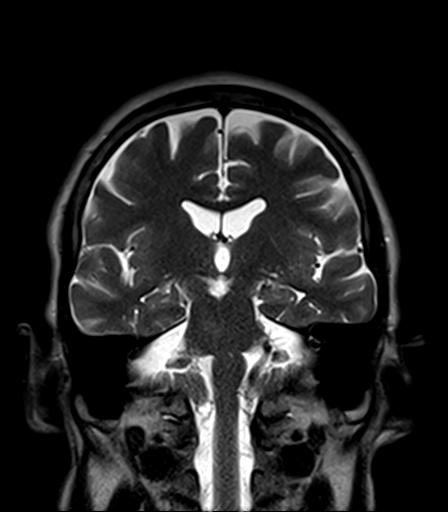
[im 25/33]
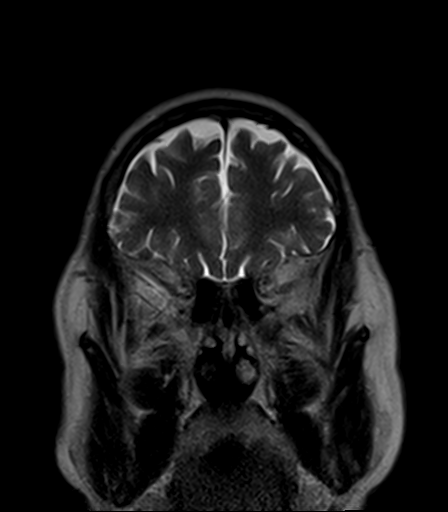
[im 33/33]
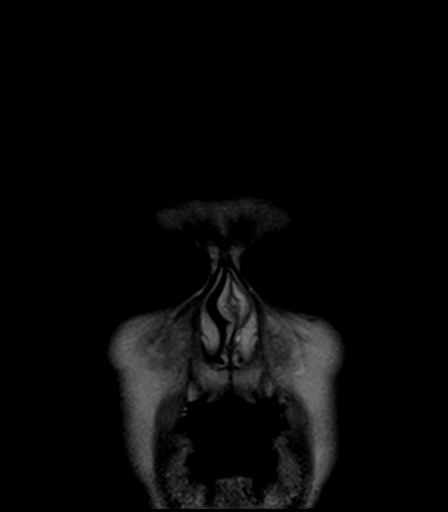

[38 of 48 positions shown; findings below may reference images not displayed]

FINDINGS: MRI HEAD FINDINGS

Brain: 16 x 9 mm focus of reduced diffusion within the right
paramedian pons compatible with acute/early subacute infarction
(series 2, image 18). No associated hemorrhage or mass effect.

No extra-axial collection, hydrocephalus, mass effect, or
herniation. Small foci of susceptibility hypointensity within the
left temporal lobe and the right occipital lobe may represent
hemosiderin deposition of chronic microhemorrhage or possibly a tiny
cavernoma.

Vascular: Increased T1 signal within the wall of mid basilar artery
(series 10, image 10).

Skull and upper cervical spine: Normal marrow signal.

Sinuses/Orbits: Left maxillary sinus mucous retention cyst. No
abnormal signal of the additional included paranasal sinuses or the
mastoid air cells. Orbits are unremarkable.

Other: None.

MRA NECK FINDINGS

Aortic arch: Patent.

Right common carotid artery: Patent.

Right internal carotid artery: Patent.

Right vertebral artery: Patent.

Left common carotid artery: Patent.

Left Internal carotid artery: Patent.

Left Vertebral artery: Patent.

There is no evidence of hemodynamically significant stenosis by
NASCET criteria, occlusion, or aneurysm of the carotid or vertebral
arteries in the neck.

Intracranial internal carotid arteries, proximal MCA, proximal
anterior cerebral arteries, vertebrobasilar system, and proximal
posterior cerebral arteries are patent. There is a short segment of
moderate 50% stenosis of the mid basilar artery. There is faintly
increased T1 signal within the wall of the basilar artery at the
level of stenosis (series 9, image 56).
IMPRESSION: 1. 16 mm acute/early subacute infarction within the right paramedian
pons. No associated hemorrhage or mass effect.
2. Segment of moderate stenosis of mid basilar artery at the level
of infarction with increased T1 signal in the vessel wall compatible
with focal dissection.
3. Patent carotid and vertebral arteries without significant
stenosis by NASCET criteria.

These results will be called to the ordering clinician or
representative by the Radiologist Assistant, and communication
documented in the PACS or zVision Dashboard.

## 2020-08-20 DIAGNOSIS — J449 Chronic obstructive pulmonary disease, unspecified: Secondary | ICD-10-CM | POA: Diagnosis not present

## 2020-08-21 DIAGNOSIS — J449 Chronic obstructive pulmonary disease, unspecified: Secondary | ICD-10-CM | POA: Diagnosis not present

## 2020-09-11 DIAGNOSIS — J449 Chronic obstructive pulmonary disease, unspecified: Secondary | ICD-10-CM | POA: Diagnosis not present

## 2020-09-14 DIAGNOSIS — G4733 Obstructive sleep apnea (adult) (pediatric): Secondary | ICD-10-CM | POA: Diagnosis not present

## 2020-09-21 DIAGNOSIS — J449 Chronic obstructive pulmonary disease, unspecified: Secondary | ICD-10-CM | POA: Diagnosis not present

## 2020-10-06 DIAGNOSIS — J449 Chronic obstructive pulmonary disease, unspecified: Secondary | ICD-10-CM | POA: Diagnosis not present

## 2020-10-13 DIAGNOSIS — E782 Mixed hyperlipidemia: Secondary | ICD-10-CM | POA: Diagnosis not present

## 2020-10-13 DIAGNOSIS — I10 Essential (primary) hypertension: Secondary | ICD-10-CM | POA: Diagnosis not present

## 2020-10-13 DIAGNOSIS — D500 Iron deficiency anemia secondary to blood loss (chronic): Secondary | ICD-10-CM | POA: Diagnosis not present

## 2020-10-13 DIAGNOSIS — E785 Hyperlipidemia, unspecified: Secondary | ICD-10-CM | POA: Diagnosis not present

## 2020-10-13 DIAGNOSIS — R7301 Impaired fasting glucose: Secondary | ICD-10-CM | POA: Diagnosis not present

## 2020-10-13 DIAGNOSIS — E039 Hypothyroidism, unspecified: Secondary | ICD-10-CM | POA: Diagnosis not present

## 2020-10-14 DIAGNOSIS — M25572 Pain in left ankle and joints of left foot: Secondary | ICD-10-CM | POA: Diagnosis not present

## 2020-10-14 DIAGNOSIS — J42 Unspecified chronic bronchitis: Secondary | ICD-10-CM | POA: Diagnosis not present

## 2020-10-14 DIAGNOSIS — N189 Chronic kidney disease, unspecified: Secondary | ICD-10-CM | POA: Diagnosis not present

## 2020-10-14 DIAGNOSIS — E039 Hypothyroidism, unspecified: Secondary | ICD-10-CM | POA: Diagnosis not present

## 2020-10-14 DIAGNOSIS — I739 Peripheral vascular disease, unspecified: Secondary | ICD-10-CM | POA: Diagnosis not present

## 2020-10-14 DIAGNOSIS — G2581 Restless legs syndrome: Secondary | ICD-10-CM | POA: Diagnosis not present

## 2020-10-14 DIAGNOSIS — B351 Tinea unguium: Secondary | ICD-10-CM | POA: Diagnosis not present

## 2020-10-14 DIAGNOSIS — I639 Cerebral infarction, unspecified: Secondary | ICD-10-CM | POA: Diagnosis not present

## 2020-10-22 DIAGNOSIS — J449 Chronic obstructive pulmonary disease, unspecified: Secondary | ICD-10-CM | POA: Diagnosis not present

## 2020-10-29 DIAGNOSIS — J449 Chronic obstructive pulmonary disease, unspecified: Secondary | ICD-10-CM | POA: Diagnosis not present

## 2020-11-04 DIAGNOSIS — I70219 Atherosclerosis of native arteries of extremities with intermittent claudication, unspecified extremity: Secondary | ICD-10-CM | POA: Insufficient documentation

## 2020-11-04 NOTE — Progress Notes (Signed)
MRN : [id number]  Perry Martinez is a 65 y.o. (August 13, 1955) male who presents with chief complaint of legs hurt.  History of Present Illness:   The patient is seen for evaluation of painful right lower extremity and diminished pulses. Patient notes the pain is always associated with activity and is very consistent day today. Typically, the pain occurs at less than one block, progress is as activity continues to the point that the patient must stop walking. Resting including standing still for several minutes allowed resumption of the activity and the ability to walk a similar distance before stopping again. Uneven terrain and inclined shorten the distance. The pain has been progressive over the past several years. The patient states the inability to walk is now having a profound negative impact on quality of life and daily activities.  The patient denies rest pain or dangling of an extremity off the side of the bed during the night for relief. No open wounds or sores at this time. No prior interventions or surgeries.  No history of back problems or DJD of the lumbar sacral spine.   The patient denies changes in claudication symptoms or new rest pain symptoms.  No new ulcers or wounds of the foot.  The patient's blood pressure has been stable and relatively well controlled. The patient denies amaurosis fugax or recent TIA symptoms. There are no recent neurological changes noted. The patient denies history of DVT, PE or superficial thrombophlebitis. The patient denies recent episodes of angina or shortness of breath.    ABI's Rt=0.81 and Lt=1.06  No outpatient medications have been marked as taking for the 11/05/20 encounter (Appointment) with Gilda Crease, Latina Craver, MD.    Past Medical History:  Diagnosis Date   Acute blood loss anemia 04/09/2020   Acute CVA (cerebrovascular accident) (HCC) 08/09/2018   AKI (acute kidney injury) (HCC) 04/09/2020   Atrial fibrillation (HCC)    Depression     Hyperlipidemia    Hypertension    Hypothyroidism 06/28/2016   Substance abuse (HCC)    IV drugs when younger    Past Surgical History:  Procedure Laterality Date   COLONOSCOPY N/A 04/11/2020   Procedure: COLONOSCOPY;  Surgeon: Toledo, Boykin Nearing, MD;  Location: [facility] ENDOSCOPY;  Service: Gastroenterology;  Laterality: N/A;   ESOPHAGOGASTRODUODENOSCOPY N/A 04/11/2020   Procedure: ESOPHAGOGASTRODUODENOSCOPY (EGD);  Surgeon: Toledo, Boykin Nearing, MD;  Location: [facility] ENDOSCOPY;  Service: Gastroenterology;  Laterality: N/A;   FRACTURE SURGERY     right foot   ROTATOR CUFF REPAIR     right shoulder    Social History Social History   Tobacco Use   Smoking status: Every Day    Packs/day: 0.50    Years: 50.00    Pack years: 25.00    Types: Cigarettes    Start date: 06/28/1966   Smokeless tobacco: Never   Tobacco comments:    previously smoked 1ppd  Vaping Use   Vaping Use: Never used  Substance Use Topics   Alcohol use: No   Drug use: No    Family History Family History  Problem Relation Age of Onset   Heart disease Mother        dieed at 17   Arthritis Mother    Diabetes Mother    Heart disease Father 66       bypass   Arthritis Father    Hyperlipidemia Father    Hypertension Father    Rheumatic fever Father     No Known Allergies  REVIEW OF SYSTEMS (Negative unless checked)  Constitutional: [] Weight loss  [] Fever  [] Chills Cardiac: [] Chest pain   [] Chest pressure   [] Palpitations   [] Shortness of breath when laying flat   [] Shortness of breath with exertion. Vascular:  [x] Pain in legs with walking   [] Pain in legs at rest  [] History of DVT   [] Phlebitis   [] Swelling in legs   [] Varicose veins   [] Non-healing ulcers Pulmonary:   [] Uses home oxygen   [] Productive cough   [] Hemoptysis   [] Wheeze  [] COPD   [] Asthma Neurologic:  [] Dizziness   [] Seizures   [] History of stroke   [] History of TIA  [] Aphasia   [] Vissual changes   [] Weakness or numbness in arm   [] Weakness or  numbness in leg Musculoskeletal:   [] Joint swelling   [] Joint pain   [] Low back pain Hematologic:  [] Easy bruising  [] Easy bleeding   [] Hypercoagulable state   [] Anemic Gastrointestinal:  [] Diarrhea   [] Vomiting  [] Gastroesophageal reflux/heartburn   [] Difficulty swallowing. Genitourinary:  [] Chronic kidney disease   [] Difficult urination  [] Frequent urination   [] Blood in urine Skin:  [] Rashes   [] Ulcers  Psychological:  [] History of anxiety   []  History of major depression.  Physical Examination  There were no vitals filed for this visit. There is no height or weight on file to calculate BMI. Gen: WD/WN, NAD Head: [place]/AT, No temporalis wasting.  Ear/Nose/Throat: Hearing grossly intact, nares w/o erythema or drainage Eyes: PER, EOMI, sclera nonicteric.  Neck: Supple, no masses.  No bruit or JVD.  Pulmonary:  Good air movement, no audible wheezing, no use of accessory muscles.  Cardiac: RRR, normal S1, S2, no Murmurs. Vascular:   Vessel Right Left  Radial Palpable Palpable  PT Not Palpable 1+ Palpable  DP Not Palpable 1+ Palpable  Gastrointestinal: soft, non-distended. No guarding/no peritoneal signs.  Musculoskeletal: M/S 5/5 throughout.  No visible deformity.  Neurologic: CN 2-12 intact. Pain and light touch intact in extremities.  Symmetrical.  Speech is fluent. Motor exam as listed above. Psychiatric: Judgment intact, Mood & affect appropriate for pt's clinical situation. Dermatologic: No rashes or ulcers noted.  No changes consistent with cellulitis.   CBC Lab Results  Component Value Date   WBC 6.0 04/12/2020   HGB 8.5 (L) 04/12/2020   HCT 29.8 (L) 04/12/2020   MCV 73.8 (L) 04/12/2020   PLT 155 04/12/2020    BMET    Component Value Date/Time   NA 137 04/12/2020 0410   K 3.8 04/12/2020 0410   CL 107 04/12/2020 0410   CO2 22 04/12/2020 0410   GLUCOSE 89 04/12/2020 0410   BUN 18 04/12/2020 0410   CREATININE 1.33 (H) 04/12/2020 0410   CREATININE 1.27 (H) 06/27/2016  1120   CALCIUM 8.6 (L) 04/12/2020 0410   GFRNONAA 59 (L) 04/12/2020 0410   GFRNONAA 60 06/27/2016 1120   GFRAA >60 08/11/2018 0423   GFRAA 69 06/27/2016 1120   CrCl cannot be calculated (Patient's most recent lab result is older than the maximum 21 days allowed.).  COAG Lab Results  Component Value Date   INR 1.4 (H) 04/10/2020   INR 1.6 (H) 04/09/2020   INR 1.4 (H) 08/09/2018    Radiology No results found.   Assessment/Plan 1. Atherosclerosis of native artery of both lower extremities with intermittent claudication (HCC)  Recommend:  The patient has evidence of atherosclerosis of the lower extremities with claudication.  The patient does not voice lifestyle limiting changes at this point in time.  Noninvasive  studies do not suggest clinically significant change.  No invasive studies, angiography or surgery at this time The patient should continue walking and begin a more formal exercise program.  The patient should continue antiplatelet therapy and aggressive treatment of the lipid abnormalities  No changes in the patient's medications at this time  The patient should continue wearing graduated compression socks 10-15 mmHg strength to control the mild edema.    - VAS [place] ABI WITH/WO TBI; Future  2. Elevated blood pressure, situational Continue antihypertensive medications as already ordered, these medications have been reviewed and there are no changes at this time.   3. Paroxysmal atrial fibrillation (HCC) Continue antiarrhythmia medications as already ordered, these medications have been reviewed and there are no changes at this time.  Continue anticoagulation as ordered by Cardiology Service   4. Hyperlipidemia, unspecified hyperlipidemia type Continue statin as ordered and reviewed, no changes at this time     Levora Dredge, MD  11/04/2020 9:20 PM

## 2020-11-05 ENCOUNTER — Ambulatory Visit (INDEPENDENT_AMBULATORY_CARE_PROVIDER_SITE_OTHER): Payer: Medicare Other

## 2020-11-05 ENCOUNTER — Other Ambulatory Visit: Payer: Self-pay

## 2020-11-05 ENCOUNTER — Other Ambulatory Visit (INDEPENDENT_AMBULATORY_CARE_PROVIDER_SITE_OTHER): Payer: Self-pay | Admitting: Internal Medicine

## 2020-11-05 ENCOUNTER — Encounter (INDEPENDENT_AMBULATORY_CARE_PROVIDER_SITE_OTHER): Payer: Self-pay | Admitting: Vascular Surgery

## 2020-11-05 ENCOUNTER — Ambulatory Visit (INDEPENDENT_AMBULATORY_CARE_PROVIDER_SITE_OTHER): Payer: Medicare Other | Admitting: Vascular Surgery

## 2020-11-05 VITALS — BP 110/65 | HR 57 | Resp 16 | Ht 72.0 in | Wt 204.8 lb

## 2020-11-05 DIAGNOSIS — I70213 Atherosclerosis of native arteries of extremities with intermittent claudication, bilateral legs: Secondary | ICD-10-CM | POA: Diagnosis not present

## 2020-11-05 DIAGNOSIS — I739 Peripheral vascular disease, unspecified: Secondary | ICD-10-CM

## 2020-11-05 DIAGNOSIS — I480 Paroxysmal atrial fibrillation: Secondary | ICD-10-CM | POA: Diagnosis not present

## 2020-11-05 DIAGNOSIS — E785 Hyperlipidemia, unspecified: Secondary | ICD-10-CM

## 2020-11-05 DIAGNOSIS — R030 Elevated blood-pressure reading, without diagnosis of hypertension: Secondary | ICD-10-CM | POA: Diagnosis not present

## 2020-11-21 DIAGNOSIS — J449 Chronic obstructive pulmonary disease, unspecified: Secondary | ICD-10-CM | POA: Diagnosis not present

## 2020-11-27 DIAGNOSIS — J449 Chronic obstructive pulmonary disease, unspecified: Secondary | ICD-10-CM | POA: Diagnosis not present

## 2020-12-24 DIAGNOSIS — G4733 Obstructive sleep apnea (adult) (pediatric): Secondary | ICD-10-CM | POA: Diagnosis not present

## 2021-01-18 DIAGNOSIS — D500 Iron deficiency anemia secondary to blood loss (chronic): Secondary | ICD-10-CM | POA: Diagnosis not present

## 2021-01-18 DIAGNOSIS — E039 Hypothyroidism, unspecified: Secondary | ICD-10-CM | POA: Diagnosis not present

## 2021-01-19 DIAGNOSIS — G2581 Restless legs syndrome: Secondary | ICD-10-CM | POA: Diagnosis not present

## 2021-01-19 DIAGNOSIS — I7389 Other specified peripheral vascular diseases: Secondary | ICD-10-CM | POA: Diagnosis not present

## 2021-01-19 DIAGNOSIS — M25572 Pain in left ankle and joints of left foot: Secondary | ICD-10-CM | POA: Diagnosis not present

## 2021-01-19 DIAGNOSIS — N189 Chronic kidney disease, unspecified: Secondary | ICD-10-CM | POA: Diagnosis not present

## 2021-01-19 DIAGNOSIS — E782 Mixed hyperlipidemia: Secondary | ICD-10-CM | POA: Diagnosis not present

## 2021-01-19 DIAGNOSIS — E039 Hypothyroidism, unspecified: Secondary | ICD-10-CM | POA: Diagnosis not present

## 2021-01-19 DIAGNOSIS — B351 Tinea unguium: Secondary | ICD-10-CM | POA: Diagnosis not present

## 2021-03-21 DIAGNOSIS — E039 Hypothyroidism, unspecified: Secondary | ICD-10-CM | POA: Diagnosis not present

## 2021-03-21 DIAGNOSIS — I10 Essential (primary) hypertension: Secondary | ICD-10-CM | POA: Diagnosis not present

## 2021-03-21 DIAGNOSIS — I4891 Unspecified atrial fibrillation: Secondary | ICD-10-CM | POA: Diagnosis not present

## 2021-03-25 DIAGNOSIS — G4733 Obstructive sleep apnea (adult) (pediatric): Secondary | ICD-10-CM | POA: Diagnosis not present

## 2021-04-23 DIAGNOSIS — E039 Hypothyroidism, unspecified: Secondary | ICD-10-CM | POA: Diagnosis not present

## 2021-04-23 DIAGNOSIS — N189 Chronic kidney disease, unspecified: Secondary | ICD-10-CM | POA: Diagnosis not present

## 2021-04-27 DIAGNOSIS — I7389 Other specified peripheral vascular diseases: Secondary | ICD-10-CM | POA: Diagnosis not present

## 2021-04-27 DIAGNOSIS — B351 Tinea unguium: Secondary | ICD-10-CM | POA: Diagnosis not present

## 2021-04-27 DIAGNOSIS — N189 Chronic kidney disease, unspecified: Secondary | ICD-10-CM | POA: Diagnosis not present

## 2021-04-27 DIAGNOSIS — E782 Mixed hyperlipidemia: Secondary | ICD-10-CM | POA: Diagnosis not present

## 2021-04-27 DIAGNOSIS — M25572 Pain in left ankle and joints of left foot: Secondary | ICD-10-CM | POA: Diagnosis not present

## 2021-04-27 DIAGNOSIS — E039 Hypothyroidism, unspecified: Secondary | ICD-10-CM | POA: Diagnosis not present

## 2021-04-29 DIAGNOSIS — J449 Chronic obstructive pulmonary disease, unspecified: Secondary | ICD-10-CM | POA: Diagnosis not present

## 2021-05-06 ENCOUNTER — Ambulatory Visit (INDEPENDENT_AMBULATORY_CARE_PROVIDER_SITE_OTHER): Payer: Medicare Other | Admitting: Vascular Surgery

## 2021-05-06 ENCOUNTER — Other Ambulatory Visit (INDEPENDENT_AMBULATORY_CARE_PROVIDER_SITE_OTHER): Payer: Self-pay | Admitting: Vascular Surgery

## 2021-05-06 ENCOUNTER — Other Ambulatory Visit: Payer: Self-pay

## 2021-05-06 ENCOUNTER — Ambulatory Visit (INDEPENDENT_AMBULATORY_CARE_PROVIDER_SITE_OTHER): Payer: Medicare Other

## 2021-05-06 VITALS — BP 115/77 | HR 56 | Resp 16 | Ht 72.0 in | Wt 200.4 lb

## 2021-05-06 DIAGNOSIS — E785 Hyperlipidemia, unspecified: Secondary | ICD-10-CM | POA: Diagnosis not present

## 2021-05-06 DIAGNOSIS — I739 Peripheral vascular disease, unspecified: Secondary | ICD-10-CM

## 2021-05-06 DIAGNOSIS — I70213 Atherosclerosis of native arteries of extremities with intermittent claudication, bilateral legs: Secondary | ICD-10-CM

## 2021-05-06 DIAGNOSIS — R030 Elevated blood-pressure reading, without diagnosis of hypertension: Secondary | ICD-10-CM | POA: Diagnosis not present

## 2021-05-06 DIAGNOSIS — I480 Paroxysmal atrial fibrillation: Secondary | ICD-10-CM | POA: Diagnosis not present

## 2021-05-06 NOTE — Progress Notes (Signed)
? ? ?MRN : [id number] ? ?Perry Martinez is a 66 y.o. ([birth year]) male who presents with chief complaint of check legs. ? ?History of Present Illness:  ?The patient returns to the office for followup and review of the noninvasive studies. There have been no interval changes in lower extremity symptoms. No interval shortening of the patient's claudication distance or development of rest pain symptoms. No new ulcers or wounds have occurred since the last visit. ? ?There have been no significant changes to the patient's overall health care. ? ?The patient denies amaurosis fugax or recent TIA symptoms. There are no recent neurological changes noted. ?The patient denies history of DVT, PE or superficial thrombophlebitis. ?The patient denies recent episodes of angina or shortness of breath.  ? ?ABI Rt=0.83 and Lt=1.05  (previous ABI's Rt=0.81 and Lt=1.06) ?Duplex ultrasound of the right leg shows 75-99% distal SFA stenosis  ? ? ?No outpatient medications have been marked as taking for the 05/06/21 encounter (Appointment) with Delana Meyer, Dolores Lory, MD.  ? ? ?Past Medical History:  ?Diagnosis Date  ? Acute blood loss anemia 04/09/2020  ? Acute CVA (cerebrovascular accident) ([place]) 08/09/2018  ? AKI (acute kidney injury) ([place]) 04/09/2020  ? Atrial fibrillation ([place])   ? Depression   ? Hyperlipidemia   ? Hypertension   ? Hypothyroidism 06/28/2016  ? OSA on CPAP 06/16/2020  ? Substance abuse ([place])   ? IV drugs when younger  ? ? ?Past Surgical History:  ?Procedure Laterality Date  ? COLONOSCOPY N/A 04/11/2020  ? Procedure: COLONOSCOPY;  Surgeon: Toledo, Benay Pike, MD;  Location: [facility] END[facility]COPY;  Service: Gastroenterology;  Laterality: N/A;  ? ESOPHAGOGASTRODUODENOSCOPY N/A 04/11/2020  ? Procedure: ESOPHAGOGASTRODUODENOSCOPY (EGD);  Surgeon: Toledo, Benay Pike, MD;  Location: [facility] ENDOSCOPY;  Service: Gastroenterology;  Laterality: N/A;  ? FRACTURE SURGERY    ? right foot  ? ROTATOR CUFF REPAIR    ? right shoulder  ? ? ?Social  History ?Social History  ? ?Tobacco Use  ? Smoking status: Every Day  ?  Packs/day: 0.50  ?  Years: 50.00  ?  Pack years: 25.00  ?  Types: Cigarettes  ?  Start date: 06/28/1966  ? Smokeless tobacco: Never  ? Tobacco comments:  ?  previously smoked 1ppd  ?Vaping Use  ? Vaping Use: Never used  ?Substance Use Topics  ? Alcohol use: No  ? Drug use: No  ? ? ?Family History ?Family History  ?Problem Relation Age of Onset  ? Heart disease Mother   ?     dieed at 69  ? Arthritis Mother   ? Diabetes Mother   ? Heart disease Father 60  ?     bypass  ? Arthritis Father   ? Hyperlipidemia Father   ? Hypertension Father   ? Rheumatic fever Father   ? ? ?Allergies  ?Allergen Reactions  ? Pollen Extract   ?  Other reaction(s): Cough  ? ? ? ?REVIEW OF SYSTEMS (Negative unless checked) ? ?Constitutional: [] Weight loss  [] Fever  [] Chills ?Cardiac: [] Chest pain   [] Chest pressure   [] Palpitations   [] Shortness of breath when laying flat   [] Shortness of breath with exertion. ?Vascular:  [] Pain in legs with walking   [] Pain in legs at rest  [] History of DVT   [] Phlebitis   [] Swelling in legs   [] Varicose veins   [] Non-healing ulcers ?Pulmonary:   [] Uses home oxygen   [] Productive cough   [] Hemoptysis   [] Wheeze  [] COPD   [] Asthma ?Neurologic:  []   Dizziness   [] Seizures   [] History of stroke   [] History of TIA  [] Aphasia   [] Vissual changes   [] Weakness or numbness in arm   [] Weakness or numbness in leg ?Musculoskeletal:   [] Joint swelling   [] Joint pain   [] Low back pain ?Hematologic:  [] Easy bruising  [] Easy bleeding   [] Hypercoagulable state   [] Anemic ?Gastrointestinal:  [] Diarrhea   [] Vomiting  [] Gastroesophageal reflux/heartburn   [] Difficulty swallowing. ?Genitourinary:  [] Chronic kidney disease   [] Difficult urination  [] Frequent urination   [] Blood in urine ?Skin:  [] Rashes   [] Ulcers  ?Psychological:  [] History of anxiety   []  History of major depression. ? ?Physical Examination ? ?There were no vitals filed for this  visit. ?There is no height or weight on file to calculate BMI. ?Gen: WD/WN, NAD ?Head: [place]/AT, No temporalis wasting.  ?Ear/Nose/Throat: Hearing grossly intact, nares w/o erythema or drainage ?Eyes: PER, EOMI, sclera nonicteric.  ?Neck: Supple, no masses.  No bruit or JVD.  ?Pulmonary:  Good air movement, no audible wheezing, no use of accessory muscles.  ?Cardiac: RRR, normal S1, S2, no Murmurs. ?Vascular:   ?Vessel Right Left  ?Radial Palpable Palpable  ?PT Not Palpable Not Palpable  ?DP Not Palpable Not Palpable  ?Gastrointestinal: soft, non-distended. No guarding/no peritoneal signs.  ?Musculoskeletal: M/S 5/5 throughout.  No visible deformity.  ?Neurologic: CN 2-12 intact. Pain and light touch intact in extremities.  Symmetrical.  Speech is fluent. Motor exam as listed above. ?Psychiatric: Judgment intact, Mood & affect appropriate for pt's clinical situation. ?Dermatologic: No rashes or ulcers noted.  No changes consistent with cellulitis. ? ? ?CBC ?Lab Results  ?Component Value Date  ? WBC 6.0 04/12/2020  ? HGB 8.5 (L) 04/12/2020  ? HCT 29.8 (L) 04/12/2020  ? MCV 73.8 (L) 04/12/2020  ? PLT 155 04/12/2020  ? ? ?BMET ?   ?Component Value Date/Time  ? NA 137 04/12/2020 0410  ? K 3.8 04/12/2020 0410  ? CL 107 04/12/2020 0410  ? CO2 22 04/12/2020 0410  ? GLUCOSE 89 04/12/2020 0410  ? BUN 18 04/12/2020 0410  ? CREATININE 1.33 (H) 04/12/2020 0410  ? CREATININE 1.27 (H) 06/27/2016 1120  ? CALCIUM 8.6 (L) 04/12/2020 0410  ? GFRNONAA 59 (L) 04/12/2020 0410  ? GFRNONAA 60 06/27/2016 1120  ? GFRAA >60 08/11/2018 0423  ? GFRAA 69 06/27/2016 1120  ? ?CrCl cannot be calculated (Patient's most recent lab result is older than the maximum 21 days allowed.). ? ?COAG ?Lab Results  ?Component Value Date  ? INR 1.4 (H) 04/10/2020  ? INR 1.6 (H) 04/09/2020  ? INR 1.4 (H) 08/09/2018  ? ? ?Radiology ?No results found. ? ? ?Assessment/Plan ?1. Atherosclerosis of native artery of both lower extremities with intermittent claudication  ([place]) ? Recommend: ? ?The patient has evidence of atherosclerosis of the lower extremities with claudication.  The patient does not voice lifestyle limiting changes at this point in time. ? ?Noninvasive studies do not suggest clinically significant change. ? ?No invasive studies, angiography or surgery at this time ?The patient should continue walking and begin a more formal exercise program.  ?The patient should continue antiplatelet therapy and aggressive treatment of the lipid abnormalities ? ?No changes in the patient's medications at this time ? ?- VAS [place] LOWER EXTREMITY ARTERIAL DUPLEX; Future ?- VAS [place] ABI WITH/WO TBI; Future ? ?2. Paroxysmal atrial fibrillation (HCC) ?Continue antiarrhythmia medications as already ordered, these medications have been reviewed and there are no changes at this time. ? ?Continue  anticoagulation as ordered by Cardiology Service  ? ?3. Elevated blood pressure, situational ?Continue antihypertensive medications as already ordered, these medications have been reviewed and there are no changes at this time.  ? ?4. Hyperlipidemia, unspecified hyperlipidemia type ?Continue statin as ordered and reviewed, no changes at this time  ? ? ? ?Hortencia Pilar, MD ? ?05/06/2021 ?11:41 AM ? ?  ?

## 2021-05-21 DIAGNOSIS — I4891 Unspecified atrial fibrillation: Secondary | ICD-10-CM | POA: Diagnosis not present

## 2021-05-21 DIAGNOSIS — E039 Hypothyroidism, unspecified: Secondary | ICD-10-CM | POA: Diagnosis not present

## 2021-05-21 DIAGNOSIS — I10 Essential (primary) hypertension: Secondary | ICD-10-CM | POA: Diagnosis not present

## 2021-05-24 ENCOUNTER — Encounter (INDEPENDENT_AMBULATORY_CARE_PROVIDER_SITE_OTHER): Payer: Self-pay | Admitting: Vascular Surgery

## 2021-05-25 DIAGNOSIS — J449 Chronic obstructive pulmonary disease, unspecified: Secondary | ICD-10-CM | POA: Diagnosis not present

## 2021-06-01 DIAGNOSIS — T63481A Toxic effect of venom of other arthropod, accidental (unintentional), initial encounter: Secondary | ICD-10-CM | POA: Diagnosis not present

## 2021-06-17 DIAGNOSIS — J449 Chronic obstructive pulmonary disease, unspecified: Secondary | ICD-10-CM | POA: Diagnosis not present

## 2021-06-20 DIAGNOSIS — I4891 Unspecified atrial fibrillation: Secondary | ICD-10-CM | POA: Diagnosis not present

## 2021-06-20 DIAGNOSIS — I10 Essential (primary) hypertension: Secondary | ICD-10-CM | POA: Diagnosis not present

## 2021-06-20 DIAGNOSIS — E039 Hypothyroidism, unspecified: Secondary | ICD-10-CM | POA: Diagnosis not present

## 2021-07-12 DIAGNOSIS — G4733 Obstructive sleep apnea (adult) (pediatric): Secondary | ICD-10-CM | POA: Diagnosis not present

## 2021-07-23 DIAGNOSIS — E782 Mixed hyperlipidemia: Secondary | ICD-10-CM | POA: Diagnosis not present

## 2021-07-23 DIAGNOSIS — E039 Hypothyroidism, unspecified: Secondary | ICD-10-CM | POA: Diagnosis not present

## 2021-07-23 DIAGNOSIS — I739 Peripheral vascular disease, unspecified: Secondary | ICD-10-CM | POA: Diagnosis not present

## 2021-07-23 DIAGNOSIS — I6389 Other cerebral infarction: Secondary | ICD-10-CM | POA: Diagnosis not present

## 2021-07-23 DIAGNOSIS — I429 Cardiomyopathy, unspecified: Secondary | ICD-10-CM | POA: Diagnosis not present

## 2021-07-23 DIAGNOSIS — G4739 Other sleep apnea: Secondary | ICD-10-CM | POA: Diagnosis not present

## 2021-07-23 DIAGNOSIS — I4891 Unspecified atrial fibrillation: Secondary | ICD-10-CM | POA: Diagnosis not present

## 2021-07-23 DIAGNOSIS — I340 Nonrheumatic mitral (valve) insufficiency: Secondary | ICD-10-CM | POA: Diagnosis not present

## 2021-07-23 DIAGNOSIS — I480 Paroxysmal atrial fibrillation: Secondary | ICD-10-CM | POA: Diagnosis not present

## 2021-07-23 DIAGNOSIS — I351 Nonrheumatic aortic (valve) insufficiency: Secondary | ICD-10-CM | POA: Diagnosis not present

## 2021-07-30 DIAGNOSIS — E039 Hypothyroidism, unspecified: Secondary | ICD-10-CM | POA: Diagnosis not present

## 2021-07-30 DIAGNOSIS — E782 Mixed hyperlipidemia: Secondary | ICD-10-CM | POA: Diagnosis not present

## 2021-08-02 DIAGNOSIS — E782 Mixed hyperlipidemia: Secondary | ICD-10-CM | POA: Diagnosis not present

## 2021-08-02 DIAGNOSIS — J42 Unspecified chronic bronchitis: Secondary | ICD-10-CM | POA: Diagnosis not present

## 2021-08-02 DIAGNOSIS — B351 Tinea unguium: Secondary | ICD-10-CM | POA: Diagnosis not present

## 2021-08-02 DIAGNOSIS — E039 Hypothyroidism, unspecified: Secondary | ICD-10-CM | POA: Diagnosis not present

## 2021-08-02 DIAGNOSIS — Z0001 Encounter for general adult medical examination with abnormal findings: Secondary | ICD-10-CM | POA: Diagnosis not present

## 2021-08-02 DIAGNOSIS — R1312 Dysphagia, oropharyngeal phase: Secondary | ICD-10-CM | POA: Diagnosis not present

## 2021-08-02 DIAGNOSIS — I7389 Other specified peripheral vascular diseases: Secondary | ICD-10-CM | POA: Diagnosis not present

## 2021-08-02 DIAGNOSIS — M25572 Pain in left ankle and joints of left foot: Secondary | ICD-10-CM | POA: Diagnosis not present

## 2021-08-02 DIAGNOSIS — N189 Chronic kidney disease, unspecified: Secondary | ICD-10-CM | POA: Diagnosis not present

## 2021-08-02 DIAGNOSIS — G2581 Restless legs syndrome: Secondary | ICD-10-CM | POA: Diagnosis not present

## 2021-08-06 ENCOUNTER — Other Ambulatory Visit: Payer: Self-pay | Admitting: Internal Medicine

## 2021-08-06 DIAGNOSIS — R1319 Other dysphagia: Secondary | ICD-10-CM

## 2021-08-11 DIAGNOSIS — J449 Chronic obstructive pulmonary disease, unspecified: Secondary | ICD-10-CM | POA: Diagnosis not present

## 2021-08-20 ENCOUNTER — Ambulatory Visit
Admission: RE | Admit: 2021-08-20 | Discharge: 2021-08-20 | Disposition: A | Discharge status: Home / Self Care | Payer: Medicare Other | Source: Ambulatory Visit | Attending: Internal Medicine | Admitting: Internal Medicine

## 2021-08-20 DIAGNOSIS — R1310 Dysphagia, unspecified: Secondary | ICD-10-CM | POA: Diagnosis not present

## 2021-08-20 DIAGNOSIS — R1319 Other dysphagia: Secondary | ICD-10-CM | POA: Insufficient documentation

## 2021-08-23 DIAGNOSIS — E782 Mixed hyperlipidemia: Secondary | ICD-10-CM | POA: Diagnosis not present

## 2021-08-23 DIAGNOSIS — R0602 Shortness of breath: Secondary | ICD-10-CM | POA: Diagnosis not present

## 2021-08-23 DIAGNOSIS — R55 Syncope and collapse: Secondary | ICD-10-CM | POA: Diagnosis not present

## 2021-08-23 DIAGNOSIS — G4730 Sleep apnea, unspecified: Secondary | ICD-10-CM | POA: Diagnosis not present

## 2021-08-23 DIAGNOSIS — I739 Peripheral vascular disease, unspecified: Secondary | ICD-10-CM | POA: Diagnosis not present

## 2021-08-23 DIAGNOSIS — I4891 Unspecified atrial fibrillation: Secondary | ICD-10-CM | POA: Diagnosis not present

## 2021-09-02 DIAGNOSIS — J449 Chronic obstructive pulmonary disease, unspecified: Secondary | ICD-10-CM | POA: Diagnosis not present

## 2021-09-20 DIAGNOSIS — E039 Hypothyroidism, unspecified: Secondary | ICD-10-CM | POA: Diagnosis not present

## 2021-09-20 DIAGNOSIS — I4891 Unspecified atrial fibrillation: Secondary | ICD-10-CM | POA: Diagnosis not present

## 2021-09-20 DIAGNOSIS — I10 Essential (primary) hypertension: Secondary | ICD-10-CM | POA: Diagnosis not present

## 2021-10-26 DIAGNOSIS — F17200 Nicotine dependence, unspecified, uncomplicated: Secondary | ICD-10-CM | POA: Diagnosis not present

## 2021-10-26 DIAGNOSIS — G4730 Sleep apnea, unspecified: Secondary | ICD-10-CM | POA: Diagnosis not present

## 2021-10-26 DIAGNOSIS — I6389 Other cerebral infarction: Secondary | ICD-10-CM | POA: Diagnosis not present

## 2021-10-26 DIAGNOSIS — I739 Peripheral vascular disease, unspecified: Secondary | ICD-10-CM | POA: Diagnosis not present

## 2021-10-26 DIAGNOSIS — E039 Hypothyroidism, unspecified: Secondary | ICD-10-CM | POA: Diagnosis not present

## 2021-10-26 DIAGNOSIS — I10 Essential (primary) hypertension: Secondary | ICD-10-CM | POA: Diagnosis not present

## 2021-10-26 DIAGNOSIS — E782 Mixed hyperlipidemia: Secondary | ICD-10-CM | POA: Diagnosis not present

## 2021-10-26 DIAGNOSIS — I4891 Unspecified atrial fibrillation: Secondary | ICD-10-CM | POA: Diagnosis not present

## 2021-10-26 DIAGNOSIS — I340 Nonrheumatic mitral (valve) insufficiency: Secondary | ICD-10-CM | POA: Diagnosis not present

## 2021-11-03 DIAGNOSIS — I739 Peripheral vascular disease, unspecified: Secondary | ICD-10-CM | POA: Diagnosis not present

## 2021-11-04 DIAGNOSIS — I7389 Other specified peripheral vascular diseases: Secondary | ICD-10-CM | POA: Diagnosis not present

## 2021-11-04 DIAGNOSIS — E039 Hypothyroidism, unspecified: Secondary | ICD-10-CM | POA: Diagnosis not present

## 2021-11-05 DIAGNOSIS — E782 Mixed hyperlipidemia: Secondary | ICD-10-CM | POA: Diagnosis not present

## 2021-11-05 DIAGNOSIS — N189 Chronic kidney disease, unspecified: Secondary | ICD-10-CM | POA: Diagnosis not present

## 2021-11-05 DIAGNOSIS — R1312 Dysphagia, oropharyngeal phase: Secondary | ICD-10-CM | POA: Diagnosis not present

## 2021-11-05 DIAGNOSIS — J42 Unspecified chronic bronchitis: Secondary | ICD-10-CM | POA: Diagnosis not present

## 2021-11-05 DIAGNOSIS — G2581 Restless legs syndrome: Secondary | ICD-10-CM | POA: Diagnosis not present

## 2021-11-05 DIAGNOSIS — I7389 Other specified peripheral vascular diseases: Secondary | ICD-10-CM | POA: Diagnosis not present

## 2021-11-05 DIAGNOSIS — M25572 Pain in left ankle and joints of left foot: Secondary | ICD-10-CM | POA: Diagnosis not present

## 2021-11-05 DIAGNOSIS — E039 Hypothyroidism, unspecified: Secondary | ICD-10-CM | POA: Diagnosis not present

## 2021-11-05 DIAGNOSIS — B351 Tinea unguium: Secondary | ICD-10-CM | POA: Diagnosis not present

## 2021-11-16 DIAGNOSIS — J449 Chronic obstructive pulmonary disease, unspecified: Secondary | ICD-10-CM | POA: Diagnosis not present

## 2021-12-07 DIAGNOSIS — J449 Chronic obstructive pulmonary disease, unspecified: Secondary | ICD-10-CM | POA: Diagnosis not present

## 2021-12-20 ENCOUNTER — Encounter (INDEPENDENT_AMBULATORY_CARE_PROVIDER_SITE_OTHER): Payer: Self-pay

## 2021-12-24 DIAGNOSIS — R7309 Other abnormal glucose: Secondary | ICD-10-CM | POA: Diagnosis not present

## 2021-12-24 DIAGNOSIS — E785 Hyperlipidemia, unspecified: Secondary | ICD-10-CM | POA: Diagnosis not present

## 2021-12-24 DIAGNOSIS — G4733 Obstructive sleep apnea (adult) (pediatric): Secondary | ICD-10-CM | POA: Diagnosis not present

## 2021-12-24 DIAGNOSIS — E039 Hypothyroidism, unspecified: Secondary | ICD-10-CM | POA: Diagnosis not present

## 2021-12-24 DIAGNOSIS — R200 Anesthesia of skin: Secondary | ICD-10-CM | POA: Diagnosis not present

## 2021-12-24 DIAGNOSIS — E559 Vitamin D deficiency, unspecified: Secondary | ICD-10-CM | POA: Diagnosis not present

## 2021-12-24 DIAGNOSIS — G2581 Restless legs syndrome: Secondary | ICD-10-CM | POA: Diagnosis not present

## 2021-12-24 DIAGNOSIS — I480 Paroxysmal atrial fibrillation: Secondary | ICD-10-CM | POA: Diagnosis not present

## 2021-12-24 DIAGNOSIS — I70213 Atherosclerosis of native arteries of extremities with intermittent claudication, bilateral legs: Secondary | ICD-10-CM | POA: Diagnosis not present

## 2021-12-24 DIAGNOSIS — R7989 Other specified abnormal findings of blood chemistry: Secondary | ICD-10-CM | POA: Diagnosis not present

## 2021-12-24 DIAGNOSIS — R202 Paresthesia of skin: Secondary | ICD-10-CM | POA: Diagnosis not present

## 2021-12-24 DIAGNOSIS — R208 Other disturbances of skin sensation: Secondary | ICD-10-CM | POA: Diagnosis not present

## 2021-12-28 ENCOUNTER — Ambulatory Visit: Payer: Medicare Other | Admitting: Gastroenterology

## 2022-01-01 ENCOUNTER — Emergency Department
Admission: EM | Admit: 2022-01-01 | Discharge: 2022-01-01 | Disposition: A | Discharge status: Home / Self Care | Payer: Medicare Other | Attending: Emergency Medicine | Admitting: Emergency Medicine

## 2022-01-01 ENCOUNTER — Encounter: Payer: Self-pay | Admitting: Emergency Medicine

## 2022-01-01 ENCOUNTER — Emergency Department: Payer: Medicare Other

## 2022-01-01 ENCOUNTER — Other Ambulatory Visit: Payer: Self-pay

## 2022-01-01 DIAGNOSIS — R079 Chest pain, unspecified: Secondary | ICD-10-CM | POA: Diagnosis not present

## 2022-01-01 DIAGNOSIS — E039 Hypothyroidism, unspecified: Secondary | ICD-10-CM | POA: Diagnosis not present

## 2022-01-01 DIAGNOSIS — Z720 Tobacco use: Secondary | ICD-10-CM | POA: Diagnosis not present

## 2022-01-01 DIAGNOSIS — W01198A Fall on same level from slipping, tripping and stumbling with subsequent striking against other object, initial encounter: Secondary | ICD-10-CM | POA: Diagnosis not present

## 2022-01-01 DIAGNOSIS — J449 Chronic obstructive pulmonary disease, unspecified: Secondary | ICD-10-CM | POA: Insufficient documentation

## 2022-01-01 DIAGNOSIS — S299XXA Unspecified injury of thorax, initial encounter: Secondary | ICD-10-CM | POA: Diagnosis not present

## 2022-01-01 MED ORDER — LIDOCAINE 5 % EX PTCH: 1.0000 | MEDICATED_PATCH | Freq: Two times a day (BID) | CUTANEOUS | 0 refills | Status: DC

## 2022-01-01 MED ORDER — HYDROCODONE-ACETAMINOPHEN 5-325 MG PO TABS: 1.0000 | ORAL_TABLET | Freq: Four times a day (QID) | ORAL | 0 refills | Status: AC | PRN

## 2022-01-01 MED ORDER — HYDROCODONE-ACETAMINOPHEN 5-325 MG PO TABS: 1.0000 | ORAL_TABLET | Freq: Four times a day (QID) | ORAL | 0 refills | Status: DC | PRN

## 2022-01-01 MED ORDER — LIDOCAINE 5 % EX PTCH: 1.0000 | MEDICATED_PATCH | Freq: Two times a day (BID) | CUTANEOUS | 0 refills | Status: AC

## 2022-01-01 NOTE — Discharge Instructions (Addendum)
-  Your x-ray did not show any significant signs of trauma, however I suspect that you may have some subtle rib fractures.  These will typically resolve on their own.  Please take ibuprofen and utilize the lidocaine patches as needed for pain and continue to take deep breaths throughout the day to prevent pneumonia.  You may take hydrocodone as needed, the uses sparingly as it may make you dizzy/drowsy and can also be addicting.  -Return to the emergency department anytime if you begin to experience any new or worsening symptoms.

## 2022-01-01 NOTE — ED Provider Notes (Signed)
[facility] Provider Note    Event Date/Time   First MD Initiated Contact with Patient 01/01/22 0913     (approximate)   History   Chief Complaint Chest Pain   HPI Perry Martinez is a 66 y.o. male, history of tobacco use, COPD, hyperlipidemia, hypothyroidism, atrial fibrillation, presents to the emergency department for evaluation of rib pain.  Patient states that he fell this past Monday and struck his rib cage on the counter on the left side.  Since then, he has had persistent pain.  He is concerned for possible collapsed lung.  He has not been taking any medications for pain at this time.  Denies fever/chills, shortness of breath, abdominal pain, flank pain, nausea/vomiting, diarrhea, weakness, rash/lesions, or dizziness/lightheadedness.  History Limitations: No limitations.        Physical Exam  Triage Vital Signs: ED Triage Vitals  Enc Vitals Group     BP 01/01/22 0904 135/83     Pulse Rate 01/01/22 0904 (!) 52     Resp 01/01/22 0904 16     Temp 01/01/22 0904 97.6 F (36.4 C)     Temp Source 01/01/22 0904 Oral     SpO2 01/01/22 0904 97 %     Weight 01/01/22 0859 205 lb (93 kg)     Height 01/01/22 0859 6' (1.829 m)     Head Circumference --      Peak Flow --      Pain Score 01/01/22 0859 10     Pain Loc --      Pain Edu? --      Excl. in GC? --     Most recent vital signs: Vitals:   01/01/22 0904 01/01/22 1124  BP: 135/83 (!) 141/87  Pulse: (!) 52 64  Resp: 16 14  Temp: 97.6 F (36.4 C)   SpO2: 97% 96%    General: Awake, NAD.  Skin: Warm, dry. No rashes or lesions.  Eyes: PERRL. Conjunctivae normal.  CV: Good peripheral perfusion.  Resp: Normal effort.  Abd: Soft, non-tender. No distention.  Neuro: At baseline. No gross neurological deficits.  Musculoskeletal: Normal ROM of all extremities.  Focused Exam: No gross deformities or ecchymosis along the chest wall.  There is mild tenderness along the lateral aspect of the left  side of the chest, particular around ribs 8 through 10.  Lung sounds are clear bilaterally in the apices and bases.  Physical Exam    ED Results / Procedures / Treatments  Labs (all labs ordered are listed, but only abnormal results are displayed) Labs Reviewed - No data to display   EKG N/A.    RADIOLOGY  ED Provider Interpretation: I personally reviewed and interpreted this x-ray, no evidence of pneumothorax or rib fractures.  DG Chest 2 View  Result Date: 01/01/2022 CLINICAL DATA:  Chest pain.  Recent fall. EXAM: CHEST - 2 VIEW COMPARISON:  07/04/2018 FINDINGS: Mild hyperinflation. Right rotator cuff repair. Midline trachea. Normal heart size. Atherosclerosis in the transverse aorta. No pleural effusion or pneumothorax. Diffuse peribronchial thickening. IMPRESSION: 1. No acute cardiopulmonary disease. 2. Hyperinflation and diffuse interstitial thickening most consistent with chronic bronchitis, likely due to smoking. 3.  Aortic Atherosclerosis (ICD10-I70.0). Electronically Signed   By: Jeronimo Greaves M.D.   On: 01/01/2022 09:55    PROCEDURES:  Critical Care performed: N/A.  Procedures    MEDICATIONS ORDERED IN ED: Medications - No data to display   IMPRESSION / MDM / ASSESSMENT AND PLAN / ED  COURSE  I reviewed the triage vital signs and the nursing notes.                              Differential diagnosis includes, but is not limited to, rib fractures, pneumothorax, intercostal muscle strain, hemothorax.  Assessment/Plan Patient presents with left-sided chest pain following mechanical fall that occurred approximately 6 days prior.  Physical exam is overall fairly unremarkable, though does show some point tenderness along the lateral aspect of ribs 8 through 10.  X-rays not show any evidence of acute fractures, though I suspect that he may have some subtle rib fractures.  We will provide him with a prescription for hydrocodone/acetaminophen and lidocaine patches to help  with pain.  Encouraged him to continue taking deep breaths throughout the day to prevent pneumonia.  Recommend that he follow-up with his primary care provider as needed.  He was amenable to this plan.  Will discharge.  Provided the patient with anticipatory guidance, return precautions, and educational material. Encouraged the patient to return to the emergency department at any time if they begin to experience any new or worsening symptoms. Patient expressed understanding and agreed with the plan.   Patient's presentation is most consistent with acute complicated illness / injury requiring diagnostic workup.       FINAL CLINICAL IMPRESSION(S) / ED DIAGNOSES   Final diagnoses:  Rib injury     Rx / DC Orders   ED Discharge Orders          Ordered    HYDROcodone-acetaminophen (NORCO/VICODIN) 5-325 MG tablet  Every 6 hours PRN,   Status:  Discontinued        01/01/22 1120    lidocaine (LIDODERM) 5 %  Every 12 hours,   Status:  Discontinued        01/01/22 1120    HYDROcodone-acetaminophen (NORCO/VICODIN) 5-325 MG tablet  Every 6 hours PRN        01/01/22 1211    lidocaine (LIDODERM) 5 %  Every 12 hours        01/01/22 1211             Note:  This document was prepared using Dragon voice recognition software and may include unintentional dictation errors.   Varney Daily, [place] 01/01/22 1541    Sharman Cheek, MD 01/03/22 [phone]

## 2022-01-01 NOTE — ED Triage Notes (Signed)
Pt to ER states he fell on Monday striking rib cage on counter.  States is continuing to have pain from the same.

## 2022-01-05 ENCOUNTER — Telehealth: Payer: Self-pay

## 2022-01-05 NOTE — Telephone Encounter (Signed)
        Patient  visited [facility] on 11/11   Telephone encounter attempt :  1st  A HIPAA compliant voice message was left requesting a return call.  Instructed patient to call back     Carlisia Geno Pop Health Care Guide, [facility], Care Management  [phone] [address], [place], [place] [address] Phone: [phone] Email: [email or web address]       

## 2022-01-20 DIAGNOSIS — I10 Essential (primary) hypertension: Secondary | ICD-10-CM | POA: Diagnosis not present

## 2022-01-20 DIAGNOSIS — E039 Hypothyroidism, unspecified: Secondary | ICD-10-CM | POA: Diagnosis not present

## 2022-02-07 DIAGNOSIS — N189 Chronic kidney disease, unspecified: Secondary | ICD-10-CM | POA: Diagnosis not present

## 2022-02-07 DIAGNOSIS — E039 Hypothyroidism, unspecified: Secondary | ICD-10-CM | POA: Diagnosis not present

## 2022-02-15 DIAGNOSIS — N189 Chronic kidney disease, unspecified: Secondary | ICD-10-CM | POA: Diagnosis not present

## 2022-02-15 DIAGNOSIS — B351 Tinea unguium: Secondary | ICD-10-CM | POA: Diagnosis not present

## 2022-02-15 DIAGNOSIS — E039 Hypothyroidism, unspecified: Secondary | ICD-10-CM | POA: Diagnosis not present

## 2022-02-15 DIAGNOSIS — M25572 Pain in left ankle and joints of left foot: Secondary | ICD-10-CM | POA: Diagnosis not present

## 2022-02-15 DIAGNOSIS — E781 Pure hyperglyceridemia: Secondary | ICD-10-CM | POA: Diagnosis not present

## 2022-02-15 DIAGNOSIS — I7389 Other specified peripheral vascular diseases: Secondary | ICD-10-CM | POA: Diagnosis not present

## 2022-02-15 DIAGNOSIS — R1312 Dysphagia, oropharyngeal phase: Secondary | ICD-10-CM | POA: Diagnosis not present

## 2022-02-19 DIAGNOSIS — I10 Essential (primary) hypertension: Secondary | ICD-10-CM | POA: Diagnosis not present

## 2022-02-19 DIAGNOSIS — E039 Hypothyroidism, unspecified: Secondary | ICD-10-CM | POA: Diagnosis not present

## 2022-03-07 DIAGNOSIS — J449 Chronic obstructive pulmonary disease, unspecified: Secondary | ICD-10-CM | POA: Diagnosis not present

## 2022-03-22 DIAGNOSIS — E039 Hypothyroidism, unspecified: Secondary | ICD-10-CM | POA: Diagnosis not present

## 2022-03-22 DIAGNOSIS — I4891 Unspecified atrial fibrillation: Secondary | ICD-10-CM | POA: Diagnosis not present

## 2022-03-22 DIAGNOSIS — G4733 Obstructive sleep apnea (adult) (pediatric): Secondary | ICD-10-CM | POA: Diagnosis not present

## 2022-03-22 DIAGNOSIS — I10 Essential (primary) hypertension: Secondary | ICD-10-CM | POA: Diagnosis not present

## 2022-04-18 DIAGNOSIS — J449 Chronic obstructive pulmonary disease, unspecified: Secondary | ICD-10-CM | POA: Diagnosis not present

## 2022-05-02 ENCOUNTER — Other Ambulatory Visit: Payer: Self-pay | Admitting: Cardiovascular Disease

## 2022-05-02 ENCOUNTER — Other Ambulatory Visit: Payer: Self-pay | Admitting: Internal Medicine

## 2022-05-02 ENCOUNTER — Ambulatory Visit (INDEPENDENT_AMBULATORY_CARE_PROVIDER_SITE_OTHER): Payer: Medicare Other | Admitting: Internal Medicine

## 2022-05-02 ENCOUNTER — Encounter: Payer: Self-pay | Admitting: Internal Medicine

## 2022-05-02 VITALS — BP 108/70 | HR 63 | Ht 72.0 in | Wt 206.0 lb

## 2022-05-02 DIAGNOSIS — E039 Hypothyroidism, unspecified: Secondary | ICD-10-CM

## 2022-05-02 DIAGNOSIS — I10 Essential (primary) hypertension: Secondary | ICD-10-CM

## 2022-05-02 DIAGNOSIS — I480 Paroxysmal atrial fibrillation: Secondary | ICD-10-CM

## 2022-05-02 DIAGNOSIS — M109 Gout, unspecified: Secondary | ICD-10-CM | POA: Diagnosis not present

## 2022-05-02 DIAGNOSIS — E782 Mixed hyperlipidemia: Secondary | ICD-10-CM

## 2022-05-02 MED ORDER — COLCHICINE 0.6 MG PO TABS: ORAL_TABLET | ORAL | 2 refills | Status: DC

## 2022-05-02 MED ORDER — CELECOXIB 200 MG PO CAPS: 200.0000 mg | ORAL_CAPSULE | Freq: Two times a day (BID) | ORAL | 0 refills | Status: DC

## 2022-05-02 NOTE — Progress Notes (Signed)
Established Patient Office Visit  Subjective:  Patient ID: Perry Martinez, male    DOB: October 04, 1955  Age: 67 y.o. MRN: [id number]  Chief Complaint  Patient presents with   Acute Visit    Left wrist pain    C/o left wrist pain x 2 days which is quite severe, unrelieved by simple analgesia. Denies any trauma but does admit to undue exertion.     Past Medical History:  Diagnosis Date   Acute blood loss anemia 04/09/2020   Acute CVA (cerebrovascular accident) ([place]) 08/09/2018   AKI (acute kidney injury) ([place]) 04/09/2020   Atrial fibrillation ([place])    Depression    Hyperlipidemia    Hypertension    Hypothyroidism 06/28/2016   OSA on CPAP 06/16/2020   Substance abuse ([place])    IV drugs when younger    Past Surgical History:  Procedure Laterality Date   COLONOSCOPY N/A 04/11/2020   Procedure: COLONOSCOPY;  Surgeon: Toledo, Benay Pike, MD;  Location: [facility] ENDOSCOPY;  Service: Gastroenterology;  Laterality: N/A;   ESOPHAGOGASTRODUODENOSCOPY N/A 04/11/2020   Procedure: ESOPHAGOGASTRODUODENOSCOPY (EGD);  Surgeon: Toledo, Benay Pike, MD;  Location: [facility] ENDOSCOPY;  Service: Gastroenterology;  Laterality: N/A;   FRACTURE SURGERY     right foot   ROTATOR CUFF REPAIR     right shoulder    Social History   Socioeconomic History   Marital status: Single    Spouse name: [name]   Number of children: 2   Years of education: GED/ tech   Highest education level: Not on file  Occupational History   Occupation: build houses    Comment: self  Tobacco Use   Smoking status: Every Day    Packs/day: 0.50    Years: 50.00    Total pack years: 25.00    Types: Cigarettes    Start date: 06/28/1966   Smokeless tobacco: Never   Tobacco comments:    previously smoked 1ppd  Vaping Use   Vaping Use: Never used  Substance and Sexual Activity   Alcohol use: No   Drug use: No   Sexual activity: Not Currently  Other Topics Concern   Not on file  Social History Narrative   Lives with Ellard Artis and her  31 year old daughter and 45 year old son   Self employed   - build houses   Social Determinants of Radio broadcast assistant Strain: Not on Art therapist Insecurity: Not on file  Transportation Needs: Not on file  Physical Activity: Not on file  Stress: Not on file  Social Connections: Not on file  Intimate Partner Violence: Not on file    Family History  Problem Relation Age of Onset   Heart disease Mother        dieed at 62   Arthritis Mother    Diabetes Mother    Heart disease Father 74       bypass   Arthritis Father    Hyperlipidemia Father    Hypertension Father    Rheumatic fever Father     Allergies  Allergen Reactions   Pollen Extract     Other reaction(Jamieka Royle): Cough    Review of Systems  All other systems reviewed and are negative.      Objective:   BP 108/70   Pulse 63   Ht 6' (1.829 m)   Wt 206 lb (93.4 kg)   SpO2 92%   BMI 27.94 kg/m   Vitals:   05/02/22 1543  BP: 108/70  Pulse: 63  Height: 6' (1.829 m)  Weight: 206 lb (93.4 kg)  SpO2: 92%  BMI (Calculated): 27.93    Physical Exam Vitals reviewed.  Constitutional:      Appearance: Normal appearance.  HENT:     Head: Normocephalic.     Left Ear: There is no impacted cerumen.     Nose: Nose normal.     Mouth/Throat:     Mouth: Mucous membranes are moist.     Pharynx: No posterior oropharyngeal erythema.  Eyes:     Extraocular Movements: Extraocular movements intact.     Pupils: Pupils are equal, round, and reactive to light.  Cardiovascular:     Rate and Rhythm: Regular rhythm.     Chest Wall: PMI is not displaced.     Pulses: Normal pulses.     Heart sounds: Normal heart sounds. No murmur heard. Pulmonary:     Effort: Pulmonary effort is normal.     Breath sounds: Normal air entry. No rhonchi or rales.  Abdominal:     General: Abdomen is flat. Bowel sounds are normal. There is no distension.     Palpations: Abdomen is soft. There is no hepatomegaly, splenomegaly or mass.      Tenderness: There is no abdominal tenderness.  Musculoskeletal:        General: Swelling present. Normal range of motion.     Left wrist: Swelling and tenderness present. No effusion or lacerations.     Cervical back: Normal range of motion and neck supple.     Right lower leg: No edema.     Left lower leg: No edema.  Skin:    General: Skin is warm and dry.  Neurological:     General: No focal deficit present.     Mental Status: He is alert and oriented to person, place, and time.     Cranial Nerves: No cranial nerve deficit.     Motor: No weakness.  Psychiatric:        Mood and Affect: Mood normal.        Behavior: Behavior normal.      No results found for any visits on 05/02/22.  No results found for this or any previous visit (from the past 2160 hour(Emily Massar)).    Assessment & Plan:   Problem List Items Addressed This Visit   None 1. Paroxysmal atrial fibrillation (HCC)  2. Acquired hypothyroidism  3. Acute gout of left wrist, unspecified cause - colchicine 0.6 MG tablet; Take two tab initially then one 6 hrs later.  Following day take one tab twice daily till pain free  Dispense: 60 tablet; Refill: 2 - Uric acid    No follow-ups on file.   Total time spent: 30 minutes  Volanda Napoleon, MD  05/02/2022

## 2022-05-03 ENCOUNTER — Ambulatory Visit: Payer: Medicare Other | Admitting: Internal Medicine

## 2022-05-03 ENCOUNTER — Other Ambulatory Visit: Payer: Self-pay | Admitting: Cardiovascular Disease

## 2022-05-03 ENCOUNTER — Other Ambulatory Visit: Payer: Medicare Other

## 2022-05-03 DIAGNOSIS — M109 Gout, unspecified: Secondary | ICD-10-CM | POA: Diagnosis not present

## 2022-05-03 DIAGNOSIS — I480 Paroxysmal atrial fibrillation: Secondary | ICD-10-CM

## 2022-05-04 LAB — URIC ACID: Uric Acid: 4.5 mg/dL (ref 3.8–8.4)

## 2022-05-06 NOTE — Progress Notes (Unsigned)
MRN : [id number]  Perry Martinez is a 67 y.o. (September 19, 1955) male who presents with chief complaint of check circulation.  History of Present Illness:   The patient returns to the office for followup and review of the noninvasive studies. There have been no interval changes in lower extremity symptoms. No interval shortening of the patient's claudication distance or development of rest pain symptoms. No new ulcers or wounds have occurred since the last visit.   There have been no significant changes to the patient's overall health care.   The patient denies amaurosis fugax or recent TIA symptoms. There are no recent neurological changes noted. The patient denies history of DVT, PE or superficial thrombophlebitis. The patient denies recent episodes of angina or shortness of breath.    ABI Rt=0.89 and Lt=1.08  (previous ABI's Rt=0.83 and Lt=1.05) Previous duplex ultrasound of the right leg shows 75-99% distal SFA stenosis   No outpatient medications have been marked as taking for the 05/09/22 encounter (Appointment) with Delana Meyer, Dolores Lory, MD.    Past Medical History:  Diagnosis Date   Acute blood loss anemia 04/09/2020   Acute CVA (cerebrovascular accident) ([place]) 08/09/2018   AKI (acute kidney injury) ([place]) 04/09/2020   Atrial fibrillation ([place])    Depression    Hyperlipidemia    Hypertension    Hypothyroidism 06/28/2016   OSA on CPAP 06/16/2020   Substance abuse ([place])    IV drugs when younger    Past Surgical History:  Procedure Laterality Date   COLONOSCOPY N/A 04/11/2020   Procedure: COLONOSCOPY;  Surgeon: Toledo, Benay Pike, MD;  Location: [facility] ENDOSCOPY;  Service: Gastroenterology;  Laterality: N/A;   ESOPHAGOGASTRODUODENOSCOPY N/A 04/11/2020   Procedure: ESOPHAGOGASTRODUODENOSCOPY (EGD);  Surgeon: Toledo, Benay Pike, MD;  Location: [facility] ENDOSCOPY;  Service: Gastroenterology;  Laterality: N/A;   FRACTURE SURGERY     right foot   ROTATOR CUFF REPAIR     right  shoulder    Social History Social History   Tobacco Use   Smoking status: Every Day    Packs/day: 0.50    Years: 50.00    Additional pack years: 0.00    Total pack years: 25.00    Types: Cigarettes    Start date: 06/28/1966   Smokeless tobacco: Never   Tobacco comments:    previously smoked 1ppd  Vaping Use   Vaping Use: Never used  Substance Use Topics   Alcohol use: No   Drug use: No    Family History Family History  Problem Relation Age of Onset   Heart disease Mother        dieed at 27   Arthritis Mother    Diabetes Mother    Heart disease Father 5       bypass   Arthritis Father    Hyperlipidemia Father    Hypertension Father    Rheumatic fever Father     Allergies  Allergen Reactions   Pollen Extract     Other reaction(s): Cough     REVIEW OF SYSTEMS (Negative unless checked)  Constitutional: [] Weight loss  [] Fever  [] Chills Cardiac: [] Chest pain   [] Chest pressure   [] Palpitations   [] Shortness of breath when laying flat   [] Shortness of breath with exertion. Vascular:  [x] Pain in legs with walking   [] Pain in legs at rest  [] History of DVT   [] Phlebitis   [] Swelling in legs   [] Varicose veins   []   Non-healing ulcers Pulmonary:   [] Uses home oxygen   [] Productive cough   [] Hemoptysis   [] Wheeze  [] COPD   [] Asthma Neurologic:  [] Dizziness   [] Seizures   [] History of stroke   [] History of TIA  [] Aphasia   [] Vissual changes   [] Weakness or numbness in arm   [] Weakness or numbness in leg Musculoskeletal:   [] Joint swelling   [] Joint pain   [] Low back pain Hematologic:  [] Easy bruising  [] Easy bleeding   [] Hypercoagulable state   [] Anemic Gastrointestinal:  [] Diarrhea   [] Vomiting  [] Gastroesophageal reflux/heartburn   [] Difficulty swallowing. Genitourinary:  [] Chronic kidney disease   [] Difficult urination  [] Frequent urination   [] Blood in urine Skin:  [] Rashes   [] Ulcers  Psychological:  [] History of anxiety   []  History of major depression.  Physical  Examination  There were no vitals filed for this visit. There is no height or weight on file to calculate BMI. Gen: WD/WN, NAD Head: [place]/AT, No temporalis wasting.  Ear/Nose/Throat: Hearing grossly intact, nares w/o erythema or drainage Eyes: PER, EOMI, sclera nonicteric.  Neck: Supple, no masses.  No bruit or JVD.  Pulmonary:  Good air movement, no audible wheezing, no use of accessory muscles.  Cardiac: RRR, normal S1, S2, no Murmurs. Vascular:  mild trophic changes, no open wounds Vessel Right Left  Radial Palpable Palpable  PT Not Palpable  Palpable  DP Not Palpable  Palpable  Gastrointestinal: soft, non-distended. No guarding/no peritoneal signs.  Musculoskeletal: M/S 5/5 throughout.  No visible deformity.  Neurologic: CN 2-12 intact. Pain and light touch intact in extremities.  Symmetrical.  Speech is fluent. Motor exam as listed above. Psychiatric: Judgment intact, Mood & affect appropriate for pt's clinical situation. Dermatologic: No rashes or ulcers noted.  No changes consistent with cellulitis.   CBC Lab Results  Component Value Date   WBC 6.0 04/12/2020   HGB 8.5 (L) 04/12/2020   HCT 29.8 (L) 04/12/2020   MCV 73.8 (L) 04/12/2020   PLT 155 04/12/2020    BMET    Component Value Date/Time   NA 137 04/12/2020 0410   K 3.8 04/12/2020 0410   CL 107 04/12/2020 0410   CO2 22 04/12/2020 0410   GLUCOSE 89 04/12/2020 0410   BUN 18 04/12/2020 0410   CREATININE 1.33 (H) 04/12/2020 0410   CREATININE 1.27 (H) 06/27/2016 1120   CALCIUM 8.6 (L) 04/12/2020 0410   GFRNONAA 59 (L) 04/12/2020 0410   GFRNONAA 60 06/27/2016 1120   GFRAA >60 08/11/2018 0423   GFRAA 69 06/27/2016 1120   CrCl cannot be calculated (Patient's most recent lab result is older than the maximum 21 days allowed.).  COAG Lab Results  Component Value Date   INR 1.4 (H) 04/10/2020   INR 1.6 (H) 04/09/2020   INR 1.4 (H) 08/09/2018    Radiology No results found.   Assessment/Plan 1.  Atherosclerosis of native artery of both lower extremities with intermittent claudication (HCC) Recommend:   The patient has evidence of atherosclerosis of the lower extremities with claudication.  The patient does not voice lifestyle limiting changes at this point in time.   Noninvasive studies do not suggest clinically significant change.   No invasive studies, angiography or surgery at this time The patient should continue walking and begin a more formal exercise program.  The patient should continue antiplatelet therapy and aggressive treatment of the lipid abnormalities   No changes in the patient's medications at this time - VAS [place] ABI WITH/WO TBI; Future  2. Paroxysmal  atrial fibrillation (HCC) Continue antiarrhythmia medications as already ordered, these medications have been reviewed and there are no changes at this time.   Continue anticoagulation as ordered by Cardiology Service   3. Hyperlipidemia, unspecified hyperlipidemia type Continue statin as ordered and reviewed, no changes at this time  4. Elevated blood pressure, situational Continue antihypertensive medications as already ordered, these medications have been reviewed and there are no changes at this time.    Hortencia Pilar, MD  05/06/2022 10:56 AM

## 2022-05-09 ENCOUNTER — Emergency Department: Payer: Medicare Other

## 2022-05-09 ENCOUNTER — Other Ambulatory Visit: Payer: Self-pay

## 2022-05-09 ENCOUNTER — Ambulatory Visit (INDEPENDENT_AMBULATORY_CARE_PROVIDER_SITE_OTHER): Payer: Medicare Other

## 2022-05-09 ENCOUNTER — Ambulatory Visit (INDEPENDENT_AMBULATORY_CARE_PROVIDER_SITE_OTHER): Payer: Medicare Other | Admitting: Vascular Surgery

## 2022-05-09 ENCOUNTER — Emergency Department
Admission: EM | Admit: 2022-05-09 | Discharge: 2022-05-09 | Disposition: A | Discharge status: Home / Self Care | Payer: Medicare Other | Attending: Emergency Medicine | Admitting: Emergency Medicine

## 2022-05-09 ENCOUNTER — Encounter (INDEPENDENT_AMBULATORY_CARE_PROVIDER_SITE_OTHER): Payer: Self-pay | Admitting: Vascular Surgery

## 2022-05-09 VITALS — BP 112/74 | HR 49 | Resp 18 | Ht 72.0 in | Wt 202.0 lb

## 2022-05-09 DIAGNOSIS — I480 Paroxysmal atrial fibrillation: Secondary | ICD-10-CM

## 2022-05-09 DIAGNOSIS — R030 Elevated blood-pressure reading, without diagnosis of hypertension: Secondary | ICD-10-CM | POA: Diagnosis not present

## 2022-05-09 DIAGNOSIS — I10 Essential (primary) hypertension: Secondary | ICD-10-CM | POA: Diagnosis not present

## 2022-05-09 DIAGNOSIS — I70213 Atherosclerosis of native arteries of extremities with intermittent claudication, bilateral legs: Secondary | ICD-10-CM

## 2022-05-09 DIAGNOSIS — E785 Hyperlipidemia, unspecified: Secondary | ICD-10-CM

## 2022-05-09 DIAGNOSIS — M25532 Pain in left wrist: Secondary | ICD-10-CM | POA: Diagnosis not present

## 2022-05-09 DIAGNOSIS — R600 Localized edema: Secondary | ICD-10-CM | POA: Diagnosis not present

## 2022-05-09 MED ORDER — PREDNISONE 10 MG PO TABS: ORAL_TABLET | ORAL | 0 refills | Status: DC

## 2022-05-09 NOTE — ED Provider Notes (Signed)
[facility] Provider Note    Event Date/Time   First MD Initiated Contact with Patient 05/09/22 1241     (approximate)   History   No chief complaint on file.   HPI  Perry Martinez is a 67 y.o. male   presents to the ED with complaint of left wrist pain.  Patient was seen and diagnosed with gout and placed on a anti-inflammatory which does not seem to be helping.  Patient cannot recall an accident that happened to his wrist but states that there is pain with range of motion.  Patient has history of hypertension, CVA 2020, atrial fibs, depression, AKI 2022.      Physical Exam   Triage Vital Signs: ED Triage Vitals  Enc Vitals Group     BP 05/09/22 1202 119/88     Pulse Rate 05/09/22 1202 (!) 49     Resp 05/09/22 1202 16     Temp 05/09/22 1202 98.6 F (37 C)     Temp src --      SpO2 05/09/22 1202 100 %     Weight 05/09/22 1203 202 lb (91.6 kg)     Height 05/09/22 1203 6' (1.829 m)     Head Circumference --      Peak Flow --      Pain Score 05/09/22 1203 9     Pain Loc --      Pain Edu? --      Excl. in [place]? --     Most recent vital signs: Vitals:   05/09/22 1202  BP: 119/88  Pulse: (!) 49  Resp: 16  Temp: 98.6 F (37 C)  SpO2: 100%     General: Awake, no distress.  CV:  Good peripheral perfusion.  Resp:  Normal effort.  Abd:  No distention.  Other:  Examination of the left wrist there is no gross deformity but there is degenerative changes in the carpal area and tenderness on palpation on the radial aspect.   ED Results / Procedures / Treatments   Labs (all labs ordered are listed, but only abnormal results are displayed) Labs Reviewed - No data to display   RADIOLOGY  Left wrist x-ray images were reviewed and interpreted by myself independent of the radiologist and no fracture or subluxation was noted.  Official radiology report is negative for fracture or dislocation.   PROCEDURES:  Critical Care performed:    Procedures   MEDICATIONS ORDERED IN ED: Medications - No data to display   IMPRESSION / MDM / [place] / ED COURSE  I reviewed the triage vital signs and the nursing notes.   Differential diagnosis includes, but is not limited to, gouty arthritis, osteoarthritis, fracture left wrist, sprain, dislocation.  67 year old male presents to the ED with complaint of left wrist pain.  Patient was seen by his PCP last week and diagnosed with gout.  Patient states that the anti-inflammatory that was prescribed is not helping with his pain.  X-rays were negative for acute bony injury and patient was reassured.  A wrist brace was applied for use while he is working for added support.  He is aware that he does not have to wear this while sleeping.  A prescription for prednisone was sent to the pharmacy to begin taking as a 6-day taper and discontinue taking the anti-inflammatory at this time.  He has to follow-up with his PCP if any continued problems or call make an appointment with Dr. Leim Fabry  who is on-call for orthopedics.      Patient's presentation is most consistent with acute complicated illness / injury requiring diagnostic workup.  FINAL CLINICAL IMPRESSION(S) / ED DIAGNOSES   Final diagnoses:  Acute pain of left wrist     Rx / DC Orders   ED Discharge Orders          Ordered    predniSONE (DELTASONE) 10 MG tablet        05/09/22 1248             Note:  This document was prepared using Dragon voice recognition software and may include unintentional dictation errors.   Johnn Hai, PA-C 05/09/22 1324    Lavonia Drafts, MD 05/09/22 [phone]

## 2022-05-09 NOTE — Discharge Instructions (Signed)
Call make an appointment with Dr. Leim Fabry who is on-call for orthopedics if you continue to have problems with your wrist.  A prescription for prednisone was sent to the pharmacy for you to begin taking today.  This starts with 6 tablets and then tapers down by 1 tablet each day until on day 6 you are taking 1 tablet.  Wear wrist splint for protection and support.

## 2022-05-09 NOTE — ED Triage Notes (Signed)
Pt to ED for left wrist pain and swelling after working with it. States was treated for gout last week for it but thinks maybe it was injured

## 2022-05-10 ENCOUNTER — Encounter (INDEPENDENT_AMBULATORY_CARE_PROVIDER_SITE_OTHER): Payer: Self-pay | Admitting: Vascular Surgery

## 2022-05-13 ENCOUNTER — Telehealth: Payer: Self-pay

## 2022-05-13 NOTE — Telephone Encounter (Signed)
     Patient  visit on 05/09/2022  at [facility] was for Acute pain of left wrist.  Have you been able to follow up with your primary care physician? Patient has appointment scheduled with PCP 05/09/22.  The patient was or was not able to obtain any needed medicine or equipment. Patient was able to obtain medication.  Are there diet recommendations that you are having difficulty following? No  Patient expresses understanding of discharge instructions and education provided has no other needs at this time. Yes   [place] Resource Care Guide   [email or web address]  ?? [id number]   Website: [email or web address]  [email or web address]

## 2022-05-16 ENCOUNTER — Other Ambulatory Visit: Payer: Self-pay | Admitting: Internal Medicine

## 2022-05-16 ENCOUNTER — Other Ambulatory Visit: Payer: Medicare Other

## 2022-05-16 DIAGNOSIS — E039 Hypothyroidism, unspecified: Secondary | ICD-10-CM | POA: Diagnosis not present

## 2022-05-16 DIAGNOSIS — E781 Pure hyperglyceridemia: Secondary | ICD-10-CM

## 2022-05-16 LAB — VAS US ABI WITH/WO TBI
Left ABI: 1.08
Right ABI: 0.89

## 2022-05-17 ENCOUNTER — Encounter: Payer: Self-pay | Admitting: Internal Medicine

## 2022-05-17 ENCOUNTER — Ambulatory Visit (INDEPENDENT_AMBULATORY_CARE_PROVIDER_SITE_OTHER): Payer: Medicare Other | Admitting: Internal Medicine

## 2022-05-17 VITALS — BP 122/78 | HR 54 | Temp 97.0°F | Ht 72.0 in | Wt 207.0 lb

## 2022-05-17 DIAGNOSIS — M25532 Pain in left wrist: Secondary | ICD-10-CM

## 2022-05-17 DIAGNOSIS — I480 Paroxysmal atrial fibrillation: Secondary | ICD-10-CM | POA: Diagnosis not present

## 2022-05-17 DIAGNOSIS — E039 Hypothyroidism, unspecified: Secondary | ICD-10-CM

## 2022-05-17 DIAGNOSIS — E781 Pure hyperglyceridemia: Secondary | ICD-10-CM

## 2022-05-17 DIAGNOSIS — E782 Mixed hyperlipidemia: Secondary | ICD-10-CM

## 2022-05-17 LAB — TSH: TSH: 0.297 u[IU]/mL — ABNORMAL LOW (ref 0.450–4.500)

## 2022-05-17 LAB — LIPID PANEL W/O CHOL/HDL RATIO
Cholesterol, Total: 114 mg/dL (ref 100–199)
HDL: 48 mg/dL (ref >39)
LDL Chol Calc (NIH): 47 mg/dL (ref 0–99)
Triglycerides: 99 mg/dL (ref 0–149)
VLDL Cholesterol Cal: 19 mg/dL (ref 5–40)

## 2022-05-17 MED ORDER — CELECOXIB 400 MG PO CAPS: 400.0000 mg | ORAL_CAPSULE | Freq: Every day | ORAL | 0 refills | Status: DC

## 2022-05-17 MED ORDER — ROSUVASTATIN CALCIUM 40 MG PO TABS: 40.0000 mg | ORAL_TABLET | Freq: Every day | ORAL | 0 refills | Status: DC

## 2022-05-17 MED ORDER — FENOFIBRATE 160 MG PO TABS: 160.0000 mg | ORAL_TABLET | Freq: Every day | ORAL | 1 refills | Status: DC

## 2022-05-17 MED ORDER — LEVOTHYROXINE SODIUM 175 MCG PO TABS: 175.0000 ug | ORAL_TABLET | Freq: Every day | ORAL | 0 refills | Status: DC

## 2022-05-17 NOTE — Progress Notes (Signed)
Established Patient Office Visit  Subjective:  Patient ID: Perry Martinez, male    DOB: August 20, 1955  Age: 67 y.o. MRN: [id number]  Chief Complaint  Patient presents with   Follow-up    3 month follow up, review lab results.    Still c/o pain in wrist, unrelieved by colchicine, went to the ER last week and was prescribed a medrol pak with relief but pain has recurred since he completed it.    No other concerns at this time.   Past Medical History:  Diagnosis Date   Acute blood loss anemia 04/09/2020   Acute CVA (cerebrovascular accident) ([place]) 08/09/2018   AKI (acute kidney injury) ([place]) 04/09/2020   Atrial fibrillation ([place])    Depression    Hyperlipidemia    Hypertension    Hypothyroidism 06/28/2016   OSA on CPAP 06/16/2020   Substance abuse ([place])    IV drugs when younger    Past Surgical History:  Procedure Laterality Date   COLONOSCOPY N/A 04/11/2020   Procedure: COLONOSCOPY;  Surgeon: Toledo, Benay Pike, MD;  Location: [facility] ENDOSCOPY;  Service: Gastroenterology;  Laterality: N/A;   ESOPHAGOGASTRODUODENOSCOPY N/A 04/11/2020   Procedure: ESOPHAGOGASTRODUODENOSCOPY (EGD);  Surgeon: Toledo, Benay Pike, MD;  Location: [facility] ENDOSCOPY;  Service: Gastroenterology;  Laterality: N/A;   FRACTURE SURGERY     right foot   ROTATOR CUFF REPAIR     right shoulder    Social History   Socioeconomic History   Marital status: Single    Spouse name: [name]   Number of children: 2   Years of education: GED/ tech   Highest education level: Not on file  Occupational History   Occupation: build houses    Comment: self  Tobacco Use   Smoking status: Every Day    Packs/day: 0.50    Years: 50.00    Additional pack years: 0.00    Total pack years: 25.00    Types: Cigarettes    Start date: 06/28/1966   Smokeless tobacco: Never   Tobacco comments:    previously smoked 1ppd  Vaping Use   Vaping Use: Never used  Substance and Sexual Activity   Alcohol use: No   Drug use: No   Sexual  activity: Not Currently  Other Topics Concern   Not on file  Social History Narrative   Lives with Ellard Artis and her 62 year old daughter and 30 year old son   Self employed   - build houses   Social Determinants of Radio broadcast assistant Strain: Not on Art therapist Insecurity: Not on file  Transportation Needs: Not on file  Physical Activity: Not on file  Stress: Not on file  Social Connections: Not on file  Intimate Partner Violence: Not on file    Family History  Problem Relation Age of Onset   Heart disease Mother        dieed at 87   Arthritis Mother    Diabetes Mother    Heart disease Father 70       bypass   Arthritis Father    Hyperlipidemia Father    Hypertension Father    Rheumatic fever Father     Allergies  Allergen Reactions   Pollen Extract     Other reaction(Terrisa Curfman): Cough    Review of Systems  All other systems reviewed and are negative.      Objective:   BP 122/78   Pulse (!) 54   Temp (!) 97 F (36.1 C) (Tympanic)  Ht 6' (1.829 m)   Wt 207 lb (93.9 kg)   SpO2 94%   BMI 28.07 kg/m   Vitals:   05/17/22 0949  BP: 122/78  Pulse: (!) 54  Temp: (!) 97 F (36.1 C)  Height: 6' (1.829 m)  Weight: 207 lb (93.9 kg)  SpO2: 94%  TempSrc: Tympanic  BMI (Calculated): 28.07    Physical Exam Vitals reviewed.  Constitutional:      Appearance: Normal appearance.  HENT:     Head: Normocephalic.     Left Ear: There is no impacted cerumen.     Nose: Nose normal.     Mouth/Throat:     Mouth: Mucous membranes are moist.     Pharynx: No posterior oropharyngeal erythema.  Eyes:     Extraocular Movements: Extraocular movements intact.     Pupils: Pupils are equal, round, and reactive to light.  Cardiovascular:     Rate and Rhythm: Regular rhythm.     Chest Wall: PMI is not displaced.     Pulses: Normal pulses.     Heart sounds: Normal heart sounds. No murmur heard. Pulmonary:     Effort: Pulmonary effort is normal.     Breath sounds: Normal  air entry. No rhonchi or rales.  Abdominal:     General: Abdomen is flat. Bowel sounds are normal. There is no distension.     Palpations: Abdomen is soft. There is no hepatomegaly, splenomegaly or mass.     Tenderness: There is no abdominal tenderness.  Musculoskeletal:        General: Swelling present. Normal range of motion.     Left wrist: Swelling and tenderness present. No effusion or lacerations.     Cervical back: Normal range of motion and neck supple.     Right lower leg: No edema.     Left lower leg: No edema.     Comments: Wrist swelling has improved  Skin:    General: Skin is warm and dry.  Neurological:     General: No focal deficit present.     Mental Status: He is alert and oriented to person, place, and time.     Cranial Nerves: No cranial nerve deficit.     Motor: No weakness.  Psychiatric:        Mood and Affect: Mood normal.        Behavior: Behavior normal.      No results found for any visits on 05/17/22.  Recent Results (from the past 2160 hour(Isayah Ignasiak))  Uric acid     Status: None   Collection Time: 05/03/22  9:25 AM  Result Value Ref Range   Uric Acid 4.5 3.8 - 8.4 mg/dL    Comment:            Therapeutic target for gout patients: <6.0  VAS [place] ABI WITH/WO TBI     Status: None   Collection Time: 05/09/22 10:02 AM  Result Value Ref Range   Right ABI .89    Left ABI 1.08       Assessment & Plan:   Problem List Items Addressed This Visit   None   No follow-ups on file.   Total time spent: 20 minutes  Volanda Napoleon, MD  05/17/2022

## 2022-05-23 ENCOUNTER — Telehealth: Payer: Self-pay

## 2022-05-23 DIAGNOSIS — I340 Nonrheumatic mitral (valve) insufficiency: Secondary | ICD-10-CM | POA: Diagnosis not present

## 2022-05-23 NOTE — Telephone Encounter (Signed)
It sounds like he's had an Ortho referral before since he already knows about it.  I would go to [facility] - 7 am to 7 pm, 7 days per week

## 2022-05-23 NOTE — Telephone Encounter (Signed)
TJ PT:  patient called stating that he needs to get a shot in his wrist, its causing him so much pain he's not sleeping or able to use it can you send a referral to ortho for pt or should he just  go to the walk in>

## 2022-05-24 ENCOUNTER — Ambulatory Visit
Admission: RE | Admit: 2022-05-24 | Discharge: 2022-05-24 | Disposition: A | Discharge status: Home / Self Care | Payer: Medicare Other | Source: Ambulatory Visit | Attending: Family | Admitting: Family

## 2022-05-24 ENCOUNTER — Ambulatory Visit (INDEPENDENT_AMBULATORY_CARE_PROVIDER_SITE_OTHER): Payer: Medicare Other | Admitting: Family

## 2022-05-24 ENCOUNTER — Ambulatory Visit
Admission: RE | Admit: 2022-05-24 | Discharge: 2022-05-24 | Disposition: A | Discharge status: Home / Self Care | Payer: Medicare Other | Attending: Family | Admitting: Family

## 2022-05-24 VITALS — BP 112/64 | HR 68 | Ht 72.0 in | Wt 205.4 lb

## 2022-05-24 DIAGNOSIS — M25532 Pain in left wrist: Secondary | ICD-10-CM

## 2022-05-24 NOTE — Telephone Encounter (Signed)
Patient seen [name] and took care of this today

## 2022-05-25 ENCOUNTER — Encounter: Payer: Self-pay | Admitting: Family

## 2022-05-25 LAB — CBC WITH DIFF/PLATELET
Basophils Absolute: 0 10*3/uL (ref 0.0–0.2)
Basos: 0 %
EOS (ABSOLUTE): 0.1 10*3/uL (ref 0.0–0.4)
Eos: 1 %
Hematocrit: 37.4 % — ABNORMAL LOW (ref 37.5–51.0)
Hemoglobin: 12.4 g/dL — ABNORMAL LOW (ref 13.0–17.7)
Immature Grans (Abs): 0 10*3/uL (ref 0.0–0.1)
Immature Granulocytes: 0 %
Lymphocytes Absolute: 1.2 10*3/uL (ref 0.7–3.1)
Lymphs: 11 %
MCH: 27.6 pg (ref 26.6–33.0)
MCHC: 33.2 g/dL (ref 31.5–35.7)
MCV: 83 fL (ref 79–97)
Monocytes Absolute: 0.7 10*3/uL (ref 0.1–0.9)
Monocytes: 6 %
Neutrophils Absolute: 8.8 10*3/uL — ABNORMAL HIGH (ref 1.4–7.0)
Neutrophils: 82 %
Platelets: 204 10*3/uL (ref 150–450)
RBC: 4.49 x10E6/uL (ref 4.14–5.80)
RDW: 14.6 % (ref 11.6–15.4)
WBC: 10.9 10*3/uL — ABNORMAL HIGH (ref 3.4–10.8)

## 2022-05-25 NOTE — Progress Notes (Signed)
Established Patient Office Visit  Subjective:  Patient ID: Perry Martinez, male    DOB: [birth year]  Age: 67 y.o. MRN: [id number]  Chief Complaint  Patient presents with   Wrist Pain    Patient is here today with continued issues with his left wrist.  He was previously diagnosed with gout attack, but the wrist has continued to be swollen and painful, despite multiple rounds of meds to attempt to control the gout.   He says that the swelling is slightly better, but the pain is continuing, and is so severe that it is waking him from sleep.   Wrist Pain  The pain is present in the left hand and left wrist. This is a new problem. The current episode started 1 to 4 weeks ago. There has been a history of trauma (patient reports that he was working on his deck prior to his wrist hurting.). The quality of the pain is described as aching and pounding. The pain is at a severity of 7/10. The pain is moderate. Associated symptoms include joint swelling, a limited range of motion, stiffness and tingling. Pertinent negatives include no fever. The symptoms are aggravated by activity. He has tried acetaminophen, cold, heat, movement, OTC pain meds, OTC ointments, NSAIDS and rest for the symptoms. The treatment provided no relief. His past medical history is significant for gout and osteoarthritis.    No other concerns at this time.   Past Medical History:  Diagnosis Date   Acute blood loss anemia 04/09/2020   Acute CVA (cerebrovascular accident) 08/09/2018   AKI (acute kidney injury) 04/09/2020   Atrial fibrillation    Depression    Hyperlipidemia    Hypertension    Hypothyroidism 06/28/2016   OSA on CPAP 06/16/2020   Substance abuse    IV drugs when younger    Past Surgical History:  Procedure Laterality Date   COLONOSCOPY N/A 04/11/2020   Procedure: COLONOSCOPY;  Surgeon: Toledo, Benay Pike, MD;  Location: [facility] END[facility]COPY;  Service: Gastroenterology;  Laterality: N/A;   ESOPHAGOGASTRODUODENOSCOPY  N/A 04/11/2020   Procedure: ESOPHAGOGASTRODUODENOSCOPY (EGD);  Surgeon: Toledo, Benay Pike, MD;  Location: [facility] ENDOSCOPY;  Service: Gastroenterology;  Laterality: N/A;   FRACTURE SURGERY     right foot   ROTATOR CUFF REPAIR     right shoulder    Social History   Socioeconomic History   Marital status: Single    Spouse name: [name]   Number of children: 2   Years of education: GED/ tech   Highest education level: Not on file  Occupational History   Occupation: build houses    Comment: self  Tobacco Use   Smoking status: Every Day    Packs/day: 0.50    Years: 50.00    Additional pack years: 0.00    Total pack years: 25.00    Types: Cigarettes    Start date: 06/28/1966   Smokeless tobacco: Never   Tobacco comments:    previously smoked 1ppd  Vaping Use   Vaping Use: Never used  Substance and Sexual Activity   Alcohol use: No   Drug use: No   Sexual activity: Not Currently  Other Topics Concern   Not on file  Social History Narrative   Lives with Ellard Artis and her 93 year old daughter and 59 year old son   Self employed   - build houses   Social Determinants of Radio broadcast assistant Strain: Not on file  Food Insecurity: Not on file  Transportation Needs: Not  on file  Physical Activity: Not on file  Stress: Not on file  Social Connections: Not on file  Intimate Partner Violence: Not on file    Family History  Problem Relation Age of Onset   Heart disease Mother        dieed at 3   Arthritis Mother    Diabetes Mother    Heart disease Father 65       bypass   Arthritis Father    Hyperlipidemia Father    Hypertension Father    Rheumatic fever Father     Allergies  Allergen Reactions   Pollen Extract     Other reaction(s): Cough    Review of Systems  Constitutional:  Negative for fever.  Musculoskeletal:  Positive for gout, joint pain and stiffness.  Neurological:  Positive for tingling.       Objective:   BP 112/64   Pulse 68   Ht 6' (1.829  m)   Wt 205 lb 6.4 oz (93.2 kg)   SpO2 93%   BMI 27.86 kg/m   Vitals:   05/24/22 1315  BP: 112/64  Pulse: 68  Height: 6' (1.829 m)  Weight: 205 lb 6.4 oz (93.2 kg)  SpO2: 93%  BMI (Calculated): 27.85    Physical Exam Vitals and nursing note reviewed.  Constitutional:      Appearance: Normal appearance. He is normal weight.  Eyes:     Pupils: Pupils are equal, round, and reactive to light.  Cardiovascular:     Rate and Rhythm: Normal rate and regular rhythm.     Pulses: Normal pulses.     Heart sounds: Normal heart sounds.  Pulmonary:     Effort: Pulmonary effort is normal.     Breath sounds: Normal breath sounds.  Musculoskeletal:        General: Swelling and tenderness present.     Left wrist: Swelling, effusion and tenderness present.  Neurological:     Mental Status: He is alert.  Psychiatric:        Mood and Affect: Mood normal.        Behavior: Behavior normal.      Results for orders placed or performed in visit on 05/24/22  CBC With Diff/Platelet  Result Value Ref Range   WBC 10.9 (H) 3.4 - 10.8 x10E3/uL   RBC 4.49 4.14 - 5.80 x10E6/uL   Hemoglobin 12.4 (L) 13.0 - 17.7 g/dL   Hematocrit 37.4 (L) 37.5 - 51.0 %   MCV 83 79 - 97 fL   MCH 27.6 26.6 - 33.0 pg   MCHC 33.2 31.5 - 35.7 g/dL   RDW 14.6 11.6 - 15.4 %   Platelets 204 150 - 450 x10E3/uL   Neutrophils 82 Not Estab. %   Lymphs 11 Not Estab. %   Monocytes 6 Not Estab. %   Eos 1 Not Estab. %   Basos 0 Not Estab. %   Neutrophils Absolute 8.8 (H) 1.4 - 7.0 x10E3/uL   Lymphocytes Absolute 1.2 0.7 - 3.1 x10E3/uL   Monocytes Absolute 0.7 0.1 - 0.9 x10E3/uL   EOS (ABSOLUTE) 0.1 0.0 - 0.4 x10E3/uL   Basophils Absolute 0.0 0.0 - 0.2 x10E3/uL   Immature Granulocytes 0 Not Estab. %   Immature Grans (Abs) 0.0 0.0 - 0.1 x10E3/uL    Recent Results (from the past 2160 hour(s))  Uric acid     Status: None   Collection Time: 05/03/22  9:25 AM  Result Value Ref Range   Uric Acid 4.5 3.8 -  8.4 mg/dL     Comment:            Therapeutic target for gout patients: <6.0  VAS [place] ABI WITH/WO TBI     Status: None   Collection Time: 05/09/22 10:02 AM  Result Value Ref Range   Right ABI .89    Left ABI 1.08   Lipid Panel w/o Chol/HDL Ratio     Status: None   Collection Time: 05/16/22  8:54 AM  Result Value Ref Range   Cholesterol, Total 114 100 - 199 mg/dL   Triglycerides 99 0 - 149 mg/dL   HDL 48 >39 mg/dL   VLDL Cholesterol Cal 19 5 - 40 mg/dL   LDL Chol Calc (NIH) 47 0 - 99 mg/dL  TSH     Status: Abnormal   Collection Time: 05/16/22  8:54 AM  Result Value Ref Range   TSH 0.297 (L) 0.450 - 4.500 uIU/mL  CBC With Diff/Platelet     Status: Abnormal   Collection Time: 05/24/22  1:52 PM  Result Value Ref Range   WBC 10.9 (H) 3.4 - 10.8 x10E3/uL   RBC 4.49 4.14 - 5.80 x10E6/uL   Hemoglobin 12.4 (L) 13.0 - 17.7 g/dL   Hematocrit 37.4 (L) 37.5 - 51.0 %   MCV 83 79 - 97 fL   MCH 27.6 26.6 - 33.0 pg   MCHC 33.2 31.5 - 35.7 g/dL   RDW 14.6 11.6 - 15.4 %   Platelets 204 150 - 450 x10E3/uL   Neutrophils 82 Not Estab. %   Lymphs 11 Not Estab. %   Monocytes 6 Not Estab. %   Eos 1 Not Estab. %   Basos 0 Not Estab. %   Neutrophils Absolute 8.8 (H) 1.4 - 7.0 x10E3/uL   Lymphocytes Absolute 1.2 0.7 - 3.1 x10E3/uL   Monocytes Absolute 0.7 0.1 - 0.9 x10E3/uL   EOS (ABSOLUTE) 0.1 0.0 - 0.4 x10E3/uL   Basophils Absolute 0.0 0.0 - 0.2 x10E3/uL   Immature Granulocytes 0 Not Estab. %   Immature Grans (Abs) 0.0 0.0 - 0.1 x10E3/uL      Assessment & Plan:   Problem List Items Addressed This Visit   None Visit Diagnoses     Left wrist pain    -  Primary   Relevant Orders   CBC With Diff/Platelet (Completed)   DG Wrist Complete Left (Completed)     Given that his wrist has not improved despite treatment for gout, and that his uric acid level was WNL, I am going to repeat XR to see if there is any issue that was masked by swelling at first xray.   Will also get CBC to r/o possible septic  arthritis.   Next steps TBD based on results.   Return to be determined based on results..   Total time spent: 20 minutes  Mechele Claude, FNP  05/24/2022

## 2022-05-27 ENCOUNTER — Telehealth: Payer: Self-pay | Admitting: Family

## 2022-05-27 NOTE — Addendum Note (Signed)
Addended by: Grayling Congress on: 05/27/2022 09:15 AM   Modules accepted: Orders

## 2022-05-27 NOTE — Telephone Encounter (Signed)
Patient called back and he would not like to proceed with the MRI that he agreed upon yesterday.

## 2022-06-07 ENCOUNTER — Telehealth: Payer: Self-pay

## 2022-06-07 NOTE — Telephone Encounter (Signed)
Focused Pharmacist Outreach  Details of the Visit: He is not doing real good! Swelling and pain in the L wrist is still there. He was recommended MRI but is very hesitant on getting it because hesitancy of using up Medicare. He doesn't want to mask it with pain medicine. He is almost positive its arthritis due to the usage. The only medicine that gave him any relief was a steroid. He felt that the provider thought he was asking for pain medicine.He has not been taking any medicines. Suggested Voltaren gel (off brand) and acetaminophen 8hr, asked him to keep [place] informed. Date of next Pharmacist Follow-up: 08/15/2022 Documentation loaded into clinic EMR: Done Care Plan update needed?: N/A Engagement Notes Lynann Bologna on 06/07/2022 01:47 PM Call and Notes 

## 2022-06-08 ENCOUNTER — Other Ambulatory Visit: Payer: Self-pay

## 2022-06-08 ENCOUNTER — Emergency Department
Admission: EM | Admit: 2022-06-08 | Discharge: 2022-06-08 | Disposition: A | Discharge status: Home / Self Care | Payer: Medicare Other | Attending: Emergency Medicine | Admitting: Emergency Medicine

## 2022-06-08 DIAGNOSIS — M19032 Primary osteoarthritis, left wrist: Secondary | ICD-10-CM | POA: Insufficient documentation

## 2022-06-08 DIAGNOSIS — M25532 Pain in left wrist: Secondary | ICD-10-CM | POA: Diagnosis not present

## 2022-06-08 DIAGNOSIS — M25531 Pain in right wrist: Secondary | ICD-10-CM | POA: Diagnosis not present

## 2022-06-08 DIAGNOSIS — M19031 Primary osteoarthritis, right wrist: Secondary | ICD-10-CM | POA: Diagnosis not present

## 2022-06-08 MED ORDER — CYCLOBENZAPRINE HCL 5 MG PO TABS: 5.0000 mg | ORAL_TABLET | Freq: Three times a day (TID) | ORAL | 0 refills | Status: DC | PRN

## 2022-06-08 NOTE — Discharge Instructions (Signed)
Take the prescription muscle relaxant as directed.  Continue with the previously prescribed Celebrex as directed.  Consider discussing a change in anti-inflammatories with your primary provider.  Follow-up with your PCP or orthopedics as discussed.

## 2022-06-08 NOTE — ED Provider Notes (Signed)
[facility] Emergency Department Provider Note     Event Date/Time   First MD Initiated Contact with Patient 06/08/22 1129     (approximate)   History   Wrist Pain   HPI  Perry Martinez is a 66 y.o. male presents to the ED for evaluation of persistent left wrist pain.  Patient is under the care of his primary provider for ongoing pain and swelling to the wrist.  Patient denies significant benefit with previously prescribed Celebrex or naproxen.  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    Physical Exam   Triage Vital Signs: ED Triage Vitals  Enc Vitals Group     BP 06/08/22 1119 123/80     Pulse Rate 06/08/22 1119 61     Resp 06/08/22 1119 16     Temp 06/08/22 1119 98.1 F (36.7 C)     Temp Source 06/08/22 1119 Oral     SpO2 06/08/22 1119 96 %     Weight 06/08/22 1118 205 lb 0.4 oz (93 kg)     Height 06/08/22 1118 6' (1.829 m)     Head Circumference --      Peak Flow --      Pain Score 06/08/22 1118 7     Pain Loc --      Pain Edu? --      Excl. in GC? --     Most recent vital signs: Vitals:   06/08/22 1119  BP: 123/80  Pulse: 61  Resp: 16  Temp: 98.1 F (36.7 C)  SpO2: 96%    General Awake, no distress. NAD CV:  Good peripheral perfusion.  RESP:  Normal effort.  ABD:  No distention.  MSK:  Dorsal hypertrophy across the bilateral MCPs consistent with likely OA history.  Patient with some edema noted to the fingers.  Normal composite fist bilaterally.  Normal flexion extension range of the wrist bilaterally.  Negative Finkelstein bilaterally. NEURO: Cranial nerves II to XII grossly intact.  Normal gross sensation.  ED Results / Procedures / Treatments   Labs (all labs ordered are listed, but only abnormal results are displayed) Labs Reviewed - No data to display   EKG   RADIOLOGY  No results found.   PROCEDURES:  Critical Care performed:  No  Procedures   MEDICATIONS ORDERED IN ED: Medications - No data to display   IMPRESSION / MDM / ASSESSMENT AND PLAN / ED COURSE  I reviewed the triage vital signs and the nursing notes.                              Differential diagnosis includes, but is not limited to, wrist sprain, wrist tendinitis, overuse syndrome, hand edema, OA  Patient's presentation is most consistent with acute, uncomplicated illness.  Patient to the ED for evaluation of intermittent hand pain and swelling for the last 4 weeks.  Patient is under the care of his PCP with treatment with anti-inflammatories following negative workup and negative x-rays, per chart review.  Patient reported limited benefit with previously prescribed anti-inflammatories.  Patient's diagnosis is consistent with hand swelling and OA, likely due to overuse syndrome. Patient will be discharged home with prescriptions for cyclobenzaprine. Patient is to follow up with his PCP or orthopedics for further medication management and ongoing evaluation as needed or otherwise directed. Patient is given ED precautions to return to the ED  for any worsening or new symptoms.  FINAL CLINICAL IMPRESSION(S) / ED DIAGNOSES   Final diagnoses:  Pain in both wrists  Primary osteoarthritis of both wrists     Rx / DC Orders   ED Discharge Orders          Ordered    cyclobenzaprine (FLEXERIL) 5 MG tablet  3 times daily PRN        06/08/22 1134             Note:  This document was prepared using Dragon voice recognition software and may include unintentional dictation errors.    Lissa Hoard, PA-C 06/08/22 1403    Jene Every, MD 06/08/22 1407

## 2022-06-08 NOTE — ED Triage Notes (Signed)
C/O ongoing bilateral wrist swelling.  Seen through ED for same and was started on steroid and xray.  States steroid relieved pain/swelling, but after steroids done pain returned. Seen by PCP, started on celebrex.   Arrives today stating pain to wrists and unable to grip with hands due to pain.

## 2022-06-13 ENCOUNTER — Telehealth: Payer: Self-pay | Admitting: *Deleted

## 2022-06-13 ENCOUNTER — Other Ambulatory Visit: Payer: Self-pay | Admitting: Cardiovascular Disease

## 2022-06-13 ENCOUNTER — Other Ambulatory Visit: Payer: Self-pay | Admitting: Internal Medicine

## 2022-06-13 DIAGNOSIS — G4733 Obstructive sleep apnea (adult) (pediatric): Secondary | ICD-10-CM | POA: Diagnosis not present

## 2022-06-13 DIAGNOSIS — F411 Generalized anxiety disorder: Secondary | ICD-10-CM

## 2022-06-13 NOTE — Telephone Encounter (Signed)
        Patient  visited [facility] on 06/08/2022  for treatment   Telephone encounter attempt :  1st  A HIPAA compliant voice message was left requesting a return call.  Instructed patient to call back at [phone].  Jomar Denz Greenauer -[facility] [place], Population Health [phone] [address] , [place] [address] Email : [email or web address]       

## 2022-06-14 ENCOUNTER — Other Ambulatory Visit: Payer: Self-pay | Admitting: Internal Medicine

## 2022-06-14 ENCOUNTER — Telehealth: Payer: Self-pay | Admitting: *Deleted

## 2022-06-14 DIAGNOSIS — M25532 Pain in left wrist: Secondary | ICD-10-CM

## 2022-06-14 NOTE — Telephone Encounter (Signed)
        Patient  visited [facility] on 06/08/2022  for treatment    Telephone encounter attempt :  2nd  A HIPAA compliant voice message was left requesting a return call.  Instructed patient to call back at [phone].  Yehuda Mao Greenauer -[facility] [place], Population Health [phone] [address] [place] , [place] [address] Email : [email or web address]

## 2022-06-16 ENCOUNTER — Other Ambulatory Visit: Payer: Self-pay

## 2022-06-21 DIAGNOSIS — I10 Essential (primary) hypertension: Secondary | ICD-10-CM | POA: Diagnosis not present

## 2022-06-21 DIAGNOSIS — E039 Hypothyroidism, unspecified: Secondary | ICD-10-CM | POA: Diagnosis not present

## 2022-06-21 DIAGNOSIS — I4891 Unspecified atrial fibrillation: Secondary | ICD-10-CM | POA: Diagnosis not present

## 2022-06-21 NOTE — Progress Notes (Signed)
Patient notified

## 2022-06-24 ENCOUNTER — Other Ambulatory Visit: Payer: Self-pay | Admitting: Internal Medicine

## 2022-06-24 DIAGNOSIS — N189 Chronic kidney disease, unspecified: Secondary | ICD-10-CM

## 2022-06-29 ENCOUNTER — Other Ambulatory Visit: Payer: Self-pay | Admitting: Internal Medicine

## 2022-06-30 DIAGNOSIS — J449 Chronic obstructive pulmonary disease, unspecified: Secondary | ICD-10-CM | POA: Diagnosis not present

## 2022-07-03 ENCOUNTER — Other Ambulatory Visit: Payer: Self-pay | Admitting: Cardiovascular Disease

## 2022-07-03 DIAGNOSIS — I4891 Unspecified atrial fibrillation: Secondary | ICD-10-CM

## 2022-07-06 ENCOUNTER — Ambulatory Visit: Payer: Medicare Other | Admitting: Internal Medicine

## 2022-07-11 ENCOUNTER — Other Ambulatory Visit: Payer: Self-pay | Admitting: Nurse Practitioner

## 2022-07-11 DIAGNOSIS — M25532 Pain in left wrist: Secondary | ICD-10-CM

## 2022-07-12 ENCOUNTER — Other Ambulatory Visit: Payer: Self-pay

## 2022-07-12 ENCOUNTER — Other Ambulatory Visit: Payer: Medicare Other

## 2022-07-12 DIAGNOSIS — E039 Hypothyroidism, unspecified: Secondary | ICD-10-CM | POA: Diagnosis not present

## 2022-07-12 DIAGNOSIS — E782 Mixed hyperlipidemia: Secondary | ICD-10-CM

## 2022-07-13 ENCOUNTER — Other Ambulatory Visit: Payer: Self-pay | Admitting: Internal Medicine

## 2022-07-13 DIAGNOSIS — F411 Generalized anxiety disorder: Secondary | ICD-10-CM

## 2022-07-13 LAB — LIPID PANEL
Chol/HDL Ratio: 3.5 ratio (ref 0.0–5.0)
Cholesterol, Total: 140 mg/dL (ref 100–199)
HDL: 40 mg/dL (ref >39)
LDL Chol Calc (NIH): 75 mg/dL (ref 0–99)
Triglycerides: 145 mg/dL (ref 0–149)
VLDL Cholesterol Cal: 25 mg/dL (ref 5–40)

## 2022-07-13 LAB — COMPREHENSIVE METABOLIC PANEL
ALT: 17 IU/L (ref 0–44)
AST: 21 IU/L (ref 0–40)
Albumin/Globulin Ratio: 1.8 (ref 1.2–2.2)
Albumin: 4.4 g/dL (ref 3.9–4.9)
Alkaline Phosphatase: 65 IU/L (ref 44–121)
BUN/Creatinine Ratio: 12 (ref 10–24)
BUN: 20 mg/dL (ref 8–27)
Bilirubin Total: 0.3 mg/dL (ref 0.0–1.2)
CO2: 21 mmol/L (ref 20–29)
Calcium: 8.8 mg/dL (ref 8.6–10.2)
Chloride: 108 mmol/L — ABNORMAL HIGH (ref 96–106)
Creatinine, Ser: 1.62 mg/dL — ABNORMAL HIGH (ref 0.76–1.27)
Globulin, Total: 2.5 g/dL (ref 1.5–4.5)
Glucose: 88 mg/dL (ref 70–99)
Potassium: 4.6 mmol/L (ref 3.5–5.2)
Sodium: 141 mmol/L (ref 134–144)
Total Protein: 6.9 g/dL (ref 6.0–8.5)
eGFR: 46 mL/min/{1.73_m2} — ABNORMAL LOW (ref >59)

## 2022-07-13 LAB — TSH: TSH: 2.03 u[IU]/mL (ref 0.450–4.500)

## 2022-07-14 ENCOUNTER — Telehealth: Payer: Self-pay

## 2022-07-14 NOTE — Patient Outreach (Signed)
  Care Coordination   Initial Visit Note   07/14/2022 Name: Perry Martinez MRN: [id number] DOB: 10/23/1955  Perry Martinez is a 67 y.o. year old male who sees Perry Monday, MD for primary care. I spoke with  Perry Martinez by phone today.  What matters to the patients health and wellness today?  Patient denies having any nursing or care coordination needs at this time.  Patient states his doctor informed him he may have to have an MRI of his wrist. Patient inquiring how much his insurance would cover.    Goals Addressed             This Visit's Progress    COMPLETED: Care coordination activities - no follow up needed.       Interventions Today    Flowsheet Row Most Recent Value  General Interventions   General Interventions Discussed/Reviewed General Interventions Discussed, Vaccines  [Care coordination services discussed.   SDOH survey completed.  AWV discussed and patient advised to contact provider office to schedule.  Advised to contact primary care provider office  if care coordination services needed in the future]  Vaccines --  [Immunization discussed.]  Education Interventions   Education Provided Provided Education  [Advised to contact his insurance carrier to inquire about coverage for MRI.]              SDOH assessments and interventions completed:  Yes  SDOH Interventions Today    Flowsheet Row Most Recent Value  SDOH Interventions   Food Insecurity Interventions Intervention Not Indicated  Housing Interventions Intervention Not Indicated        Care Coordination Interventions:  Yes, provided   Follow up plan: No further intervention required.   Encounter Outcome:  Pt. Visit Completed   Perry Ina RN,BSN,[facility] Care Coordination [phone] direct line

## 2022-07-15 ENCOUNTER — Ambulatory Visit (INDEPENDENT_AMBULATORY_CARE_PROVIDER_SITE_OTHER): Payer: Medicare Other | Admitting: Internal Medicine

## 2022-07-15 VITALS — BP 104/60 | HR 65 | Ht 72.0 in | Wt 201.0 lb

## 2022-07-15 DIAGNOSIS — M25532 Pain in left wrist: Secondary | ICD-10-CM | POA: Diagnosis not present

## 2022-07-15 DIAGNOSIS — E782 Mixed hyperlipidemia: Secondary | ICD-10-CM | POA: Diagnosis not present

## 2022-07-15 DIAGNOSIS — E039 Hypothyroidism, unspecified: Secondary | ICD-10-CM | POA: Diagnosis not present

## 2022-07-15 NOTE — Progress Notes (Signed)
Established Patient Office Visit  Subjective:  Patient ID: Perry Martinez, male    DOB: 1955-06-25  Age: 67 y.o. MRN: [id number]  Chief Complaint  Patient presents with   Follow-up    6 Weeks Follow up    Left wrist pain has only slightly improved on Celecoxib. Labs reviewed and notable for deterioration in renal function, rise in TC and LDL although at target. TSH also at target. Wrist pain and swelling have improved but not resolved.    No other concerns at this time.   Past Medical History:  Diagnosis Date   Acute blood loss anemia 04/09/2020   Acute CVA (cerebrovascular accident) (HCC) 08/09/2018   AKI (acute kidney injury) (HCC) 04/09/2020   Atrial fibrillation (HCC)    Depression    Hyperlipidemia    Hypertension    Hypothyroidism 06/28/2016   OSA on CPAP 06/16/2020   Substance abuse (HCC)    IV drugs when younger    Past Surgical History:  Procedure Laterality Date   COLONOSCOPY N/A 04/11/2020   Procedure: COLONOSCOPY;  Surgeon: Toledo, Boykin Nearing, MD;  Location: [facility] ENDOSCOPY;  Service: Gastroenterology;  Laterality: N/A;   ESOPHAGOGASTRODUODENOSCOPY N/A 04/11/2020   Procedure: ESOPHAGOGASTRODUODENOSCOPY (EGD);  Surgeon: Toledo, Boykin Nearing, MD;  Location: [facility] ENDOSCOPY;  Service: Gastroenterology;  Laterality: N/A;   FRACTURE SURGERY     right foot   ROTATOR CUFF REPAIR     right shoulder    Social History   Socioeconomic History   Marital status: Single    Spouse name: [name]   Number of children: 2   Years of education: GED/ tech   Highest education level: Not on file  Occupational History   Occupation: build houses    Comment: self  Tobacco Use   Smoking status: Every Day    Packs/day: 0.50    Years: 50.00    Additional pack years: 0.00    Total pack years: 25.00    Types: Cigarettes    Start date: 06/28/1966   Smokeless tobacco: Never   Tobacco comments:    previously smoked 1ppd  Vaping Use   Vaping Use: Never used  Substance and Sexual  Activity   Alcohol use: No   Drug use: No   Sexual activity: Not Currently  Other Topics Concern   Not on file  Social History Narrative   Lives with Ursula Alert and her 59 year old daughter and 54 year old son   Self employed   - build houses   Social Determinants of Corporate investment banker Strain: Not on file  Food Insecurity: No Food Insecurity (07/14/2022)   Hunger Vital Sign    Worried About Running Out of Food in the Last Year: Never true    Ran Out of Food in the Last Year: Never true  Transportation Needs: Not on file  Physical Activity: Not on file  Stress: Not on file  Social Connections: Not on file  Intimate Partner Violence: Not At Risk (07/14/2022)   Humiliation, Afraid, Rape, and Kick questionnaire    Fear of Current or Ex-Partner: No    Emotionally Abused: No    Physically Abused: No    Sexually Abused: No    Family History  Problem Relation Age of Onset   Heart disease Mother        dieed at 34   Arthritis Mother    Diabetes Mother    Heart disease Father 45       bypass   Arthritis  Father    Hyperlipidemia Father    Hypertension Father    Rheumatic fever Father     Allergies  Allergen Reactions   Pollen Extract     Other reaction(Coralee Edberg): Cough    Review of Systems  All other systems reviewed and are negative.      Objective:   BP 104/60   Pulse 65   Ht 6' (1.829 m)   Wt 201 lb (91.2 kg)   SpO2 93%   BMI 27.26 kg/m   Vitals:   07/15/22 1540  BP: 104/60  Pulse: 65  Height: 6' (1.829 m)  Weight: 201 lb (91.2 kg)  SpO2: 93%  BMI (Calculated): 27.25    Physical Exam Vitals reviewed.  Constitutional:      Appearance: Normal appearance.  HENT:     Head: Normocephalic.     Left Ear: There is no impacted cerumen.     Nose: Nose normal.     Mouth/Throat:     Mouth: Mucous membranes are moist.     Pharynx: No posterior oropharyngeal erythema.  Eyes:     Extraocular Movements: Extraocular movements intact.     Pupils: Pupils are  equal, round, and reactive to light.  Cardiovascular:     Rate and Rhythm: Regular rhythm.     Chest Wall: PMI is not displaced.     Pulses: Normal pulses.     Heart sounds: Normal heart sounds. No murmur heard. Pulmonary:     Effort: Pulmonary effort is normal.     Breath sounds: Normal air entry. No rhonchi or rales.  Abdominal:     General: Abdomen is flat. Bowel sounds are normal. There is no distension.     Palpations: Abdomen is soft. There is no hepatomegaly, splenomegaly or mass.     Tenderness: There is no abdominal tenderness.  Musculoskeletal:        General: Swelling present. Normal range of motion.     Left wrist: Swelling and tenderness present. No effusion or lacerations.     Cervical back: Normal range of motion and neck supple.     Right lower leg: No edema.     Left lower leg: No edema.     Comments: Wrist swelling has improved  Skin:    General: Skin is warm and dry.  Neurological:     General: No focal deficit present.     Mental Status: He is alert and oriented to person, place, and time.     Cranial Nerves: No cranial nerve deficit.     Motor: No weakness.  Psychiatric:        Mood and Affect: Mood normal.        Behavior: Behavior normal.      No results found for any visits on 07/15/22.  Recent Results (from the past 2160 hour(Avion Patella))  Uric acid     Status: None   Collection Time: 05/03/22  9:25 AM  Result Value Ref Range   Uric Acid 4.5 3.8 - 8.4 mg/dL    Comment:            Therapeutic target for gout patients: <6.0  VAS [place] ABI WITH/WO TBI     Status: None   Collection Time: 05/09/22 10:02 AM  Result Value Ref Range   Right ABI .89    Left ABI 1.08   Lipid Panel w/o Chol/HDL Ratio     Status: None   Collection Time: 05/16/22  8:54 AM  Result Value Ref Range   Cholesterol,  Total 114 100 - 199 mg/dL   Triglycerides 99 0 - 149 mg/dL   HDL 48 >16 mg/dL   VLDL Cholesterol Cal 19 5 - 40 mg/dL   LDL Chol Calc (NIH) 47 0 - 99 mg/dL  TSH     Status:  Abnormal   Collection Time: 05/16/22  8:54 AM  Result Value Ref Range   TSH 0.297 (L) 0.450 - 4.500 uIU/mL  CBC With Diff/Platelet     Status: Abnormal   Collection Time: 05/24/22  1:52 PM  Result Value Ref Range   WBC 10.9 (H) 3.4 - 10.8 x10E3/uL   RBC 4.49 4.14 - 5.80 x10E6/uL   Hemoglobin 12.4 (L) 13.0 - 17.7 g/dL   Hematocrit 10.9 (L) 60.4 - 51.0 %   MCV 83 79 - 97 fL   MCH 27.6 26.6 - 33.0 pg   MCHC 33.2 31.5 - 35.7 g/dL   RDW 54.0 98.1 - 19.1 %   Platelets 204 150 - 450 x10E3/uL   Neutrophils 82 Not Estab. %   Lymphs 11 Not Estab. %   Monocytes 6 Not Estab. %   Eos 1 Not Estab. %   Basos 0 Not Estab. %   Neutrophils Absolute 8.8 (H) 1.4 - 7.0 x10E3/uL   Lymphocytes Absolute 1.2 0.7 - 3.1 x10E3/uL   Monocytes Absolute 0.7 0.1 - 0.9 x10E3/uL   EOS (ABSOLUTE) 0.1 0.0 - 0.4 x10E3/uL   Basophils Absolute 0.0 0.0 - 0.2 x10E3/uL   Immature Granulocytes 0 Not Estab. %   Immature Grans (Abs) 0.0 0.0 - 0.1 x10E3/uL  Comprehensive metabolic panel     Status: Abnormal   Collection Time: 07/12/22 11:10 AM  Result Value Ref Range   Glucose 88 70 - 99 mg/dL   BUN 20 8 - 27 mg/dL   Creatinine, Ser 4.78 (H) 0.76 - 1.27 mg/dL   eGFR 46 (L) >29 FA/OZH/0.86   BUN/Creatinine Ratio 12 10 - 24   Sodium 141 134 - 144 mmol/L   Potassium 4.6 3.5 - 5.2 mmol/L   Chloride 108 (H) 96 - 106 mmol/L   CO2 21 20 - 29 mmol/L   Calcium 8.8 8.6 - 10.2 mg/dL   Total Protein 6.9 6.0 - 8.5 g/dL   Albumin 4.4 3.9 - 4.9 g/dL   Globulin, Total 2.5 1.5 - 4.5 g/dL   Albumin/Globulin Ratio 1.8 1.2 - 2.2   Bilirubin Total 0.3 0.0 - 1.2 mg/dL   Alkaline Phosphatase 65 44 - 121 IU/L   AST 21 0 - 40 IU/L   ALT 17 0 - 44 IU/L  Lipid panel     Status: None   Collection Time: 07/12/22 11:10 AM  Result Value Ref Range   Cholesterol, Total 140 100 - 199 mg/dL   Triglycerides 578 0 - 149 mg/dL   HDL 40 >46 mg/dL   VLDL Cholesterol Cal 25 5 - 40 mg/dL   LDL Chol Calc (NIH) 75 0 - 99 mg/dL   Chol/HDL Ratio 3.5  0.0 - 5.0 ratio    Comment:                                   T. Chol/HDL Ratio  Men  Women                               1/2 Avg.Risk  3.4    3.3                                   Avg.Risk  5.0    4.4                                2X Avg.Risk  9.6    7.1                                3X Avg.Risk 23.4   11.0   TSH     Status: None   Collection Time: 07/12/22 11:10 AM  Result Value Ref Range   TSH 2.030 0.450 - 4.500 uIU/mL      Assessment & Plan:   Problem List Items Addressed This Visit       Endocrine   Hypothyroidism   Relevant Orders   TSH   Comprehensive metabolic panel   Lipid panel     Other   Hyperlipidemia   Relevant Orders   Comprehensive metabolic panel   Lipid panel   Arthralgia of wrist, left - Primary   Relevant Orders   Ambulatory referral to Rheumatology    Return in about 3 months (around 10/15/2022) for fu with labs prior.   Total time spent: 30 minutes  Luna Fuse, MD  07/15/2022   This document may have been prepared by [facility] Voice Recognition software and as such may include unintentional dictation errors.

## 2022-07-19 ENCOUNTER — Telehealth: Payer: Self-pay

## 2022-07-19 NOTE — Telephone Encounter (Signed)
Patient was seen Friday and you all had discussed a cortisone shot in his wrist and he wasn't so sure but now he is wanting to  move forward with getting the injections

## 2022-07-27 ENCOUNTER — Other Ambulatory Visit: Payer: Self-pay | Admitting: Cardiovascular Disease

## 2022-07-27 DIAGNOSIS — I480 Paroxysmal atrial fibrillation: Secondary | ICD-10-CM

## 2022-07-28 ENCOUNTER — Other Ambulatory Visit: Payer: Self-pay | Admitting: Cardiovascular Disease

## 2022-07-28 DIAGNOSIS — I10 Essential (primary) hypertension: Secondary | ICD-10-CM

## 2022-08-15 ENCOUNTER — Ambulatory Visit: Payer: Medicare Other

## 2022-08-15 DIAGNOSIS — E782 Mixed hyperlipidemia: Secondary | ICD-10-CM

## 2022-08-15 DIAGNOSIS — I480 Paroxysmal atrial fibrillation: Secondary | ICD-10-CM

## 2022-08-15 NOTE — Chronic Care Management (AMB) (Signed)
Follow Up Pharmacist Visit (CCM)  Perry Martinez,Perry Martinez  67 years, Male  DOB: 1955/08/17  M: [phone]  __________________________________________________ Clinical Summary Summary Next CCM Follow Up: - Next AWV: to be scheduled Summary for PCP:  - Patient has no chronic concerns today. - Discussed about DASH diet and made recommendations for heathier substitutions of Carbohydrates. - He is up to date on Vaccines.  Patient Chart Prep (HC) Chronic Conditions Patient's Chronic Conditions: Atrial Fibrillation, Hypothyroidism, Hyperlipidemia/Dyslipidemia (HLD) Doctor and Hospital Visits Were there PCP Visits since last visit with the Pharmacist?: Yes PCP Visits details: 07/15/22-Tejan-Sie-D/C'd Celecoxib 400mg -QD & Colchicine 0.6mg  Were there Specialist Visits since last visit with the Pharmacist?: Yes Specialist Visits details: 06/08/2022-ED VISIT-Pain in both wrists, started on Flexeril 5mg  TID PRN Was there a Hospital Visit in last 30 days?: No Were there other Hospital Visits since last visit with the Pharmacist?: No Engagement Notes Laury Axon on 08/11/2022 10:05 AM Chart prep: 17 mins Bostick, Ashlea on 08/11/2022 09:50 AM Hyperlipidemia needs to be added to problem list in EPIC. Was in previous chart prep from Dec 2023 Laury Axon on 04/04/2022 05:16 PM Rescheduled to 08/15/2022 @1pm  Joycelyn Das on 01/26/2022 04:25 PM cp f/u visit 06/02/22 at 11am via phone Pre-Call Questions [facility]) Pre-Call Questions Date:: 08/11/2022 Time:: PM Outcome:: Successful Confirmed appointment date/time with patient/caregiver?: Yes Date/time of the appointment: 6/24 at 1p Visit type: Phone Patient/Caregiver instructed to bring medications to appointment: Yes What, if any, problems do you have getting your medications from the pharmacy?: None What is your top health concern to discuss at your upcoming visit?: None Have you seen any other providers since your last visit?: No Engagement  Notes Charlestine Massed on 08/11/2022 03:10 PM Pre call: Disease Assessments Subjective Information Visit Completed on: 08/15/2022 Subjective: Wrist pain: same, sees Rheumatology in July. He is not working as much and is drinking water. He thinks its arthritis. He can use his R hand a little but L hand is unusable. Lifestyle habits: Appetite and sleep are good. SDOH: Accountable Health Communities Health-Related Social Needs Screening Tool ([employer]) SDOH questions were documented and reviewed (EMR or [employer]) within the past 12 months or since hospitalization?: Yes Medication Adherence Does the [facility] have access to medication refill history?: No Hyperlipidemia/Dyslipidemia (HLD) Last Lipid panel on: 07/12/2022 TC (Goal<200): 140 LDL: 47 HDL (Goal>40): 40 TG (Goal<150): 145 ASCVD 10-year risk?is:: Intermediate (7.5%-20%) ASCVD Risk Score: 15.9 Assessed today?: Yes LDL Goal: <70 We discussed: How a diet high in fruits/vegetables/nuts/whole grains/beans may help to reduce your cholesterol. Increasing soluble fiber intake.  Avoiding sugary foods and trans fat, limiting carbohydrates, and reducing portion sizes. Recommended increasing intake of healthy fats into their diet, Increasing exercise (walking, biking, swimming) to a goal of 30 minutes per day, as able based on current activity level and health or as directed by your healthcare provider Assessment:: Controlled Drug: Fenofibrate 160mg -QD Pharmacist Assessment: Appropriate, Effective, Safe, Accessible Drug: Rosuvastatin 40mg -QD Pharmacist Assessment: Appropriate, Effective, Safe, Accessible Atrial Fibrillation Assessed today?: Yes CHADS2-VASc: Age 60 to 74 years (1), Vascular Disease (1) CHADS2-VASc Score: 2 HAS-BLED: Age 43 years or older (1) HAS-BLED Score: 1 Type of AF: Paroxysmal Type of control: Rhythm controlled We discussed: Signs and symptoms of  bleeding associated with anticoagulant use Assessment:: Controlled Drug: Amiodarone 200mg -QD Pharmacist Assessment: Appropriate, Effective, Safe, Accessible Drug: Eliquis 5mg -BID Pharmacist Assessment: Appropriate, Effective, Safe, Accessible Hypothyroidism Most recent TSH: 2.030 taken on: 07/12/2022 Most recent T4: 0.41 taken on: 04/10/2020 Most recent T3: 2.6 taken on: 04/10/2020  Assessed today?: No Drug: Levothyroxine 167mcg-QD Preventative Health Care Gap: Colorectal cancer screening: Addressed Care Gap: Breast cancer screening: Patient excluded from population (Age > 75, hx of bilateral mastectomy, frailty, hospice services) Care Gap: Annual Wellness Visit (AWV): Needs to be addressed Pharmacy Interventions Intervention Details Pharmacist Interventions discussed: Yes Monitoring: Routine monitoring Education: Lifestyle modifications . Lynann Bologna, PharmD Televisit and documentation 

## 2022-08-17 DIAGNOSIS — J449 Chronic obstructive pulmonary disease, unspecified: Secondary | ICD-10-CM | POA: Diagnosis not present

## 2022-09-09 ENCOUNTER — Other Ambulatory Visit: Payer: Self-pay | Admitting: Internal Medicine

## 2022-09-14 DIAGNOSIS — G4733 Obstructive sleep apnea (adult) (pediatric): Secondary | ICD-10-CM | POA: Diagnosis not present

## 2022-10-03 ENCOUNTER — Other Ambulatory Visit: Payer: Self-pay | Admitting: Internal Medicine

## 2022-10-05 ENCOUNTER — Other Ambulatory Visit: Payer: Self-pay | Admitting: Internal Medicine

## 2022-10-05 DIAGNOSIS — J42 Unspecified chronic bronchitis: Secondary | ICD-10-CM

## 2022-10-08 ENCOUNTER — Other Ambulatory Visit: Payer: Self-pay | Admitting: Cardiovascular Disease

## 2022-10-08 ENCOUNTER — Other Ambulatory Visit: Payer: Self-pay | Admitting: Internal Medicine

## 2022-10-08 DIAGNOSIS — N189 Chronic kidney disease, unspecified: Secondary | ICD-10-CM

## 2022-10-10 ENCOUNTER — Other Ambulatory Visit: Payer: Medicare Other

## 2022-10-10 DIAGNOSIS — E782 Mixed hyperlipidemia: Secondary | ICD-10-CM | POA: Diagnosis not present

## 2022-10-10 DIAGNOSIS — E039 Hypothyroidism, unspecified: Secondary | ICD-10-CM

## 2022-10-11 LAB — COMPREHENSIVE METABOLIC PANEL
ALT: 18 IU/L (ref 0–44)
AST: 24 IU/L (ref 0–40)
Albumin: 4.3 g/dL (ref 3.9–4.9)
Alkaline Phosphatase: 58 IU/L (ref 44–121)
BUN/Creatinine Ratio: 10 (ref 10–24)
BUN: 16 mg/dL (ref 8–27)
Bilirubin Total: 0.3 mg/dL (ref 0.0–1.2)
CO2: 23 mmol/L (ref 20–29)
Calcium: 9.3 mg/dL (ref 8.6–10.2)
Chloride: 105 mmol/L (ref 96–106)
Creatinine, Ser: 1.64 mg/dL — ABNORMAL HIGH (ref 0.76–1.27)
Globulin, Total: 2.6 g/dL (ref 1.5–4.5)
Glucose: 87 mg/dL (ref 70–99)
Potassium: 4.4 mmol/L (ref 3.5–5.2)
Sodium: 140 mmol/L (ref 134–144)
Total Protein: 6.9 g/dL (ref 6.0–8.5)
eGFR: 45 mL/min/{1.73_m2} — ABNORMAL LOW (ref >59)

## 2022-10-11 LAB — LIPID PANEL
Chol/HDL Ratio: 3.5 ratio (ref 0.0–5.0)
Cholesterol, Total: 149 mg/dL (ref 100–199)
HDL: 42 mg/dL (ref >39)
LDL Chol Calc (NIH): 80 mg/dL (ref 0–99)
Triglycerides: 155 mg/dL — ABNORMAL HIGH (ref 0–149)
VLDL Cholesterol Cal: 27 mg/dL (ref 5–40)

## 2022-10-11 LAB — TSH: TSH: 0.949 u[IU]/mL (ref 0.450–4.500)

## 2022-10-12 ENCOUNTER — Ambulatory Visit (INDEPENDENT_AMBULATORY_CARE_PROVIDER_SITE_OTHER): Payer: Medicare Other | Admitting: Internal Medicine

## 2022-10-12 VITALS — BP 100/62 | HR 58 | Ht 72.0 in | Wt 207.4 lb

## 2022-10-12 DIAGNOSIS — E039 Hypothyroidism, unspecified: Secondary | ICD-10-CM | POA: Diagnosis not present

## 2022-10-12 DIAGNOSIS — N400 Enlarged prostate without lower urinary tract symptoms: Secondary | ICD-10-CM | POA: Diagnosis not present

## 2022-10-12 DIAGNOSIS — E782 Mixed hyperlipidemia: Secondary | ICD-10-CM | POA: Diagnosis not present

## 2022-10-12 DIAGNOSIS — F411 Generalized anxiety disorder: Secondary | ICD-10-CM

## 2022-10-12 NOTE — Progress Notes (Signed)
Established Patient Office Visit  Subjective:  Patient ID: Perry Martinez, male    DOB: 04-Mar-1955  Age: 67 y.o. MRN: [id number]  Chief Complaint  Patient presents with   Follow-up    Follow up labs results    No new complaints, here for lab review and medication refills. LDL and TC well controlled on lab review. Triglycerides have slightly deteriorated however. TSH satisfactory and cmp unremarkable.     No other concerns at this time.   Past Medical History:  Diagnosis Date   Acute blood loss anemia 04/09/2020   Acute CVA (cerebrovascular accident) (HCC) 08/09/2018   AKI (acute kidney injury) (HCC) 04/09/2020   Atrial fibrillation (HCC)    Depression    Hyperlipidemia    Hypertension    Hypothyroidism 06/28/2016   OSA on CPAP 06/16/2020   Substance abuse (HCC)    IV drugs when younger    Past Surgical History:  Procedure Laterality Date   COLONOSCOPY N/A 04/11/2020   Procedure: COLONOSCOPY;  Surgeon: Toledo, Boykin Nearing, MD;  Location: [facility] ENDOSCOPY;  Service: Gastroenterology;  Laterality: N/A;   ESOPHAGOGASTRODUODENOSCOPY N/A 04/11/2020   Procedure: ESOPHAGOGASTRODUODENOSCOPY (EGD);  Surgeon: Toledo, Boykin Nearing, MD;  Location: [facility] ENDOSCOPY;  Service: Gastroenterology;  Laterality: N/A;   FRACTURE SURGERY     right foot   ROTATOR CUFF REPAIR     right shoulder    Social History   Socioeconomic History   Marital status: Single    Spouse name: [name]   Number of children: 2   Years of education: GED/ tech   Highest education level: Not on file  Occupational History   Occupation: build houses    Comment: self  Tobacco Use   Smoking status: Every Day    Current packs/day: 0.50    Average packs/day: 0.5 packs/day for 56.3 years (28.1 ttl pk-yrs)    Types: Cigarettes    Start date: 06/28/1966   Smokeless tobacco: Never   Tobacco comments:    previously smoked 1ppd  Vaping Use   Vaping status: Never Used  Substance and Sexual Activity   Alcohol use: No   Drug  use: No   Sexual activity: Not Currently  Other Topics Concern   Not on file  Social History Narrative   Lives with Ursula Alert and her 63 year old daughter and 33 year old son   Self employed   - build houses   Social Determinants of Corporate investment banker Strain: Not on file  Food Insecurity: No Food Insecurity (07/14/2022)   Hunger Vital Sign    Worried About Running Out of Food in the Last Year: Never true    Ran Out of Food in the Last Year: Never true  Transportation Needs: Not on file  Physical Activity: Not on file  Stress: Not on file  Social Connections: Not on file  Intimate Partner Violence: Not At Risk (07/14/2022)   Humiliation, Afraid, Rape, and Kick questionnaire    Fear of Current or Ex-Partner: No    Emotionally Abused: No    Physically Abused: No    Sexually Abused: No    Family History  Problem Relation Age of Onset   Heart disease Mother        dieed at 77   Arthritis Mother    Diabetes Mother    Heart disease Father 57       bypass   Arthritis Father    Hyperlipidemia Father    Hypertension Father    Rheumatic  fever Father     Allergies  Allergen Reactions   Pollen Extract     Other reaction(Ashford Clouse): Cough    Review of Systems  Constitutional: Negative.   HENT: Negative.    Eyes: Negative.   Respiratory: Negative.    Cardiovascular: Negative.   Gastrointestinal: Negative.   Genitourinary: Negative.   Musculoskeletal:  Positive for joint pain.  Skin: Negative.   Neurological: Negative.   Endo/Heme/Allergies: Negative.        Objective:   BP 100/62   Pulse (!) 58   Ht 6' (1.829 m)   Wt 207 lb 6.4 oz (94.1 kg)   SpO2 94%   BMI 28.13 kg/m   Vitals:   10/12/22 0913  BP: 100/62  Pulse: (!) 58  Height: 6' (1.829 m)  Weight: 207 lb 6.4 oz (94.1 kg)  SpO2: 94%  BMI (Calculated): 28.12    Physical Exam Vitals reviewed.  Constitutional:      Appearance: Normal appearance.  HENT:     Head: Normocephalic.     Left Ear: There  is no impacted cerumen.     Nose: Nose normal.     Mouth/Throat:     Mouth: Mucous membranes are moist.     Pharynx: No posterior oropharyngeal erythema.  Eyes:     Extraocular Movements: Extraocular movements intact.     Pupils: Pupils are equal, round, and reactive to light.  Cardiovascular:     Rate and Rhythm: Regular rhythm.     Chest Wall: PMI is not displaced.     Pulses: Normal pulses.     Heart sounds: Normal heart sounds. No murmur heard. Pulmonary:     Effort: Pulmonary effort is normal.     Breath sounds: Normal air entry. No rhonchi or rales.  Abdominal:     General: Abdomen is flat. Bowel sounds are normal. There is no distension.     Palpations: Abdomen is soft. There is no hepatomegaly, splenomegaly or mass.     Tenderness: There is no abdominal tenderness.  Musculoskeletal:        General: Swelling present. Normal range of motion.     Left wrist: Swelling and tenderness present. No effusion or lacerations.     Cervical back: Normal range of motion and neck supple.     Right lower leg: No edema.     Left lower leg: No edema.     Comments: Wrist swelling has improved  Skin:    General: Skin is warm and dry.  Neurological:     General: No focal deficit present.     Mental Status: He is alert and oriented to person, place, and time.     Cranial Nerves: No cranial nerve deficit.     Motor: No weakness.  Psychiatric:        Mood and Affect: Mood normal.        Behavior: Behavior normal.      No results found for any visits on 10/12/22.  Recent Results (from the past 2160 hour(Cervando Durnin))  Lipid panel     Status: Abnormal   Collection Time: 10/10/22  9:21 AM  Result Value Ref Range   Cholesterol, Total 149 100 - 199 mg/dL   Triglycerides 102 (H) 0 - 149 mg/dL   HDL 42 >72 mg/dL   VLDL Cholesterol Cal 27 5 - 40 mg/dL   LDL Chol Calc (NIH) 80 0 - 99 mg/dL   Chol/HDL Ratio 3.5 0.0 - 5.0 ratio    Comment:  T. Chol/HDL Ratio                                              Men  Women                               1/2 Avg.Risk  3.4    3.3                                   Avg.Risk  5.0    4.4                                2X Avg.Risk  9.6    7.1                                3X Avg.Risk 23.4   11.0   Comprehensive metabolic panel     Status: Abnormal   Collection Time: 10/10/22  9:21 AM  Result Value Ref Range   Glucose 87 70 - 99 mg/dL   BUN 16 8 - 27 mg/dL   Creatinine, Ser 6.04 (H) 0.76 - 1.27 mg/dL   eGFR 45 (L) >54 UJ/WJX/9.14   BUN/Creatinine Ratio 10 10 - 24   Sodium 140 134 - 144 mmol/L   Potassium 4.4 3.5 - 5.2 mmol/L   Chloride 105 96 - 106 mmol/L   CO2 23 20 - 29 mmol/L   Calcium 9.3 8.6 - 10.2 mg/dL   Total Protein 6.9 6.0 - 8.5 g/dL   Albumin 4.3 3.9 - 4.9 g/dL   Globulin, Total 2.6 1.5 - 4.5 g/dL   Bilirubin Total 0.3 0.0 - 1.2 mg/dL   Alkaline Phosphatase 58 44 - 121 IU/L   AST 24 0 - 40 IU/L   ALT 18 0 - 44 IU/L  TSH     Status: None   Collection Time: 10/10/22  9:21 AM  Result Value Ref Range   TSH 0.949 0.450 - 4.500 uIU/mL      Assessment & Plan:  As per problem list. Stricter low calorie diet, low cholesterol and low fat diet and exercise as much as possible.  Problem List Items Addressed This Visit       Endocrine   Hypothyroidism   Relevant Orders   TSH   PSA     Other   Hyperlipidemia - Primary   Relevant Orders   Comprehensive metabolic panel   Lipid panel   GAD (generalized anxiety disorder)   Relevant Orders   CBC With Diff/Platelet   Other Visit Diagnoses     Benign prostatic hyperplasia without lower urinary tract symptoms           Return in about 3 months (around 01/12/2023) for awv with labs prior.   Total time spent: 20 minutes  Luna Fuse, MD  10/12/2022   This document may have been prepared by [facility] Voice Recognition software and as such may include unintentional dictation errors.

## 2022-10-18 DIAGNOSIS — H5213 Myopia, bilateral: Secondary | ICD-10-CM | POA: Diagnosis not present

## 2022-10-22 ENCOUNTER — Other Ambulatory Visit: Payer: Self-pay | Admitting: Internal Medicine

## 2022-10-22 DIAGNOSIS — E782 Mixed hyperlipidemia: Secondary | ICD-10-CM

## 2022-10-30 ENCOUNTER — Other Ambulatory Visit: Payer: Self-pay | Admitting: Cardiovascular Disease

## 2022-10-30 DIAGNOSIS — I10 Essential (primary) hypertension: Secondary | ICD-10-CM

## 2022-10-31 ENCOUNTER — Other Ambulatory Visit: Payer: Self-pay | Admitting: Cardiovascular Disease

## 2022-10-31 DIAGNOSIS — I480 Paroxysmal atrial fibrillation: Secondary | ICD-10-CM

## 2022-11-03 ENCOUNTER — Other Ambulatory Visit: Payer: Self-pay | Admitting: Cardiovascular Disease

## 2022-11-03 DIAGNOSIS — I4891 Unspecified atrial fibrillation: Secondary | ICD-10-CM

## 2022-11-04 DIAGNOSIS — J449 Chronic obstructive pulmonary disease, unspecified: Secondary | ICD-10-CM | POA: Diagnosis not present

## 2022-11-10 ENCOUNTER — Other Ambulatory Visit: Payer: Self-pay | Admitting: Internal Medicine

## 2022-11-10 ENCOUNTER — Telehealth: Payer: Self-pay | Admitting: Internal Medicine

## 2022-11-10 DIAGNOSIS — F411 Generalized anxiety disorder: Secondary | ICD-10-CM

## 2022-11-10 NOTE — Telephone Encounter (Signed)
Pt states that his [facility] home health nurse told him that she thinks he has congestive heart failure. PT stated he didn't think he needed this, or that he has this diagnosis,  but wanted to ask, please advise

## 2022-11-16 DIAGNOSIS — H5213 Myopia, bilateral: Secondary | ICD-10-CM | POA: Diagnosis not present

## 2022-12-02 ENCOUNTER — Other Ambulatory Visit: Payer: Self-pay | Admitting: Internal Medicine

## 2022-12-02 DIAGNOSIS — E039 Hypothyroidism, unspecified: Secondary | ICD-10-CM

## 2022-12-06 ENCOUNTER — Other Ambulatory Visit: Payer: Self-pay

## 2022-12-07 ENCOUNTER — Other Ambulatory Visit: Payer: Self-pay

## 2022-12-23 ENCOUNTER — Other Ambulatory Visit: Payer: Self-pay

## 2022-12-28 ENCOUNTER — Other Ambulatory Visit: Payer: Self-pay | Admitting: Internal Medicine

## 2022-12-30 ENCOUNTER — Other Ambulatory Visit: Payer: Self-pay

## 2022-12-30 MED ORDER — GABAPENTIN 300 MG PO CAPS: 300.0000 mg | ORAL_CAPSULE | ORAL | 0 refills | Status: DC

## 2023-01-11 ENCOUNTER — Other Ambulatory Visit: Payer: Self-pay

## 2023-01-11 ENCOUNTER — Other Ambulatory Visit: Payer: Medicare Other

## 2023-01-11 DIAGNOSIS — E782 Mixed hyperlipidemia: Secondary | ICD-10-CM | POA: Diagnosis not present

## 2023-01-11 DIAGNOSIS — E039 Hypothyroidism, unspecified: Secondary | ICD-10-CM | POA: Diagnosis not present

## 2023-01-11 DIAGNOSIS — F411 Generalized anxiety disorder: Secondary | ICD-10-CM

## 2023-01-12 LAB — COMPREHENSIVE METABOLIC PANEL
ALT: 25 [IU]/L (ref 0–44)
AST: 35 [IU]/L (ref 0–40)
Albumin: 4.5 g/dL (ref 3.9–4.9)
Alkaline Phosphatase: 65 [IU]/L (ref 44–121)
BUN/Creatinine Ratio: 13 (ref 10–24)
BUN: 22 mg/dL (ref 8–27)
Bilirubin Total: 0.3 mg/dL (ref 0.0–1.2)
CO2: 21 mmol/L (ref 20–29)
Calcium: 8.8 mg/dL (ref 8.6–10.2)
Chloride: 106 mmol/L (ref 96–106)
Creatinine, Ser: 1.69 mg/dL — ABNORMAL HIGH (ref 0.76–1.27)
Globulin, Total: 2.6 g/dL (ref 1.5–4.5)
Glucose: 98 mg/dL (ref 70–99)
Potassium: 4.7 mmol/L (ref 3.5–5.2)
Sodium: 140 mmol/L (ref 134–144)
Total Protein: 7.1 g/dL (ref 6.0–8.5)
eGFR: 43 mL/min/{1.73_m2} — ABNORMAL LOW (ref >59)

## 2023-01-12 LAB — CBC WITH DIFF/PLATELET
Basophils Absolute: 0 10*3/uL (ref 0.0–0.2)
Basos: 1 %
EOS (ABSOLUTE): 0.2 10*3/uL (ref 0.0–0.4)
Eos: 3 %
Hematocrit: 40.3 % (ref 37.5–51.0)
Hemoglobin: 12.9 g/dL — ABNORMAL LOW (ref 13.0–17.7)
Immature Grans (Abs): 0 10*3/uL (ref 0.0–0.1)
Immature Granulocytes: 0 %
Lymphocytes Absolute: 1.5 10*3/uL (ref 0.7–3.1)
Lymphs: 25 %
MCH: 27.9 pg (ref 26.6–33.0)
MCHC: 32 g/dL (ref 31.5–35.7)
MCV: 87 fL (ref 79–97)
Monocytes Absolute: 0.5 10*3/uL (ref 0.1–0.9)
Monocytes: 8 %
Neutrophils Absolute: 3.8 10*3/uL (ref 1.4–7.0)
Neutrophils: 63 %
Platelets: 172 10*3/uL (ref 150–450)
RBC: 4.63 x10E6/uL (ref 4.14–5.80)
RDW: 14.3 % (ref 11.6–15.4)
WBC: 6 10*3/uL (ref 3.4–10.8)

## 2023-01-12 LAB — LIPID PANEL
Chol/HDL Ratio: 3.3 ratio (ref 0.0–5.0)
Cholesterol, Total: 137 mg/dL (ref 100–199)
HDL: 42 mg/dL (ref >39)
LDL Chol Calc (NIH): 72 mg/dL (ref 0–99)
Triglycerides: 129 mg/dL (ref 0–149)
VLDL Cholesterol Cal: 23 mg/dL (ref 5–40)

## 2023-01-12 LAB — TSH: TSH: 2.79 u[IU]/mL (ref 0.450–4.500)

## 2023-01-12 LAB — PSA: Prostate Specific Ag, Serum: 0.7 ng/mL (ref 0.0–4.0)

## 2023-01-13 ENCOUNTER — Ambulatory Visit (INDEPENDENT_AMBULATORY_CARE_PROVIDER_SITE_OTHER): Payer: Medicare Other | Admitting: Internal Medicine

## 2023-01-13 ENCOUNTER — Encounter: Payer: Self-pay | Admitting: Internal Medicine

## 2023-01-13 VITALS — BP 110/80 | HR 50 | Ht 72.0 in | Wt 209.8 lb

## 2023-01-13 DIAGNOSIS — G2581 Restless legs syndrome: Secondary | ICD-10-CM | POA: Diagnosis not present

## 2023-01-13 DIAGNOSIS — F411 Generalized anxiety disorder: Secondary | ICD-10-CM | POA: Diagnosis not present

## 2023-01-13 DIAGNOSIS — Z1331 Encounter for screening for depression: Secondary | ICD-10-CM

## 2023-01-13 DIAGNOSIS — E782 Mixed hyperlipidemia: Secondary | ICD-10-CM

## 2023-01-13 DIAGNOSIS — E039 Hypothyroidism, unspecified: Secondary | ICD-10-CM

## 2023-01-13 DIAGNOSIS — Z0001 Encounter for general adult medical examination with abnormal findings: Secondary | ICD-10-CM

## 2023-01-13 DIAGNOSIS — Z0130 Encounter for examination of blood pressure without abnormal findings: Secondary | ICD-10-CM

## 2023-01-13 MED ORDER — GABAPENTIN 300 MG PO CAPS: 300.0000 mg | ORAL_CAPSULE | ORAL | 2 refills | Status: DC

## 2023-01-13 MED ORDER — DIAZEPAM 10 MG PO TABS: 10.0000 mg | ORAL_TABLET | Freq: Three times a day (TID) | ORAL | 2 refills | Status: DC

## 2023-01-13 NOTE — Progress Notes (Signed)
Established Patient Office Visit  Subjective:  Patient ID: Perry Martinez, male    DOB: [birth year]  Age: 67 y.o. MRN: [id number]  No chief complaint on file.   No new complaints, here for AWV refer to quality metrics and scanned documents. LDL and TC well controlled on lab review. Triglycerides also satisfactory while CMP notable for stable CKD.  No other concerns at this time.   Past Medical History:  Diagnosis Date   Acute blood loss anemia 04/09/2020   Acute CVA (cerebrovascular accident) (HCC) 08/09/2018   AKI (acute kidney injury) (HCC) 04/09/2020   Atrial fibrillation (HCC)    Depression    Hyperlipidemia    Hypertension    Hypothyroidism 06/28/2016   OSA on CPAP 06/16/2020   Substance abuse (HCC)    IV drugs when younger    Past Surgical History:  Procedure Laterality Date   COLONOSCOPY N/A 04/11/2020   Procedure: COLONOSCOPY;  Surgeon: Toledo, Boykin Nearing, MD;  Location: [facility];  Service: Gastroenterology;  Laterality: N/A;   ESOPHAGOGASTRODUODENOSCOPY N/A 04/11/2020   Procedure: ESOPHAGOGASTRODUODENOSCOPY (EGD);  Surgeon: Toledo, Boykin Nearing, MD;  Location: [facility];  Service: Gastroenterology;  Laterality: N/A;   FRACTURE SURGERY     right foot   ROTATOR CUFF REPAIR     right shoulder    Social History   Socioeconomic History   Marital status: Single    Spouse name: [name]   Number of children: 2   Years of education: GED/ tech   Highest education level: Not on file  Occupational History   Occupation: build houses    Comment: self  Tobacco Use   Smoking status: Every Day    Current packs/day: 0.50    Average packs/day: 0.5 packs/day for 56.5 years (28.3 ttl pk-yrs)    Types: Cigarettes    Start date: 06/28/1966   Smokeless tobacco: Never   Tobacco comments:    previously smoked 1ppd  Vaping Use   Vaping status: Never Used  Substance and Sexual Activity   Alcohol use: No   Drug use: No   Sexual activity: Not Currently  Other Topics Concern    Not on file  Social History Narrative   Lives with Ursula Alert and her 61 year old daughter and 7 year old son   Self employed   - build houses   Social Determinants of Corporate investment banker Strain: Not on file  Food Insecurity: No Food Insecurity (07/14/2022)   Hunger Vital Sign    Worried About Running Out of Food in the Last Year: Never true    Ran Out of Food in the Last Year: Never true  Transportation Needs: Not on file  Physical Activity: Not on file  Stress: Not on file  Social Connections: Not on file  Intimate Partner Violence: Not At Risk (07/14/2022)   Humiliation, Afraid, Rape, and Kick questionnaire    Fear of Current or Ex-Partner: No    Emotionally Abused: No    Physically Abused: No    Sexually Abused: No    Family History  Problem Relation Age of Onset   Heart disease Mother        dieed at 25   Arthritis Mother    Diabetes Mother    Heart disease Father 57       bypass   Arthritis Father    Hyperlipidemia Father    Hypertension Father    Rheumatic fever Father     Allergies  Allergen Reactions   Pollen  Extract     Other reaction(Kelii Chittum): Cough    Outpatient Medications Prior to Visit  Medication Sig   ELIQUIS 5 MG TABS tablet TAKE 1 TABLET BY MOUTH TWICE A DAY   fenofibrate 160 MG tablet Take 1 tablet (160 mg total) by mouth daily.   furosemide (LASIX) 20 MG tablet TAKE 1 TABLET BY MOUTH EVERY DAY   ipratropium-albuterol (DUONEB) 0.5-2.5 (3) MG/3ML SOLN USE 1 VIAL IN NEBULIZER 4 TIMES DAILY   levothyroxine (SYNTHROID) 150 MCG tablet Take 150 mcg by mouth daily.   losartan (COZAAR) 25 MG tablet TAKE 1 TABLET BY MOUTH EVERY DAY   metoprolol succinate (TOPROL-XL) 25 MG 24 hr tablet TAKE 1 TABLET BY MOUTH EVERY DAY   rosuvastatin (CRESTOR) 40 MG tablet TAKE 1 TABLET BY MOUTH EVERY DAY   TRELEGY ELLIPTA 100-62.5-25 MCG/ACT AEPB USE 1 INHALATION PER DAY AS DIRECTED   [DISCONTINUED] diazepam (VALIUM) 10 MG tablet TAKE 1 TABLET BY MOUTH THREE TIMES A  DAY   [DISCONTINUED] gabapentin (NEURONTIN) 300 MG capsule Take 1 capsule (300 mg total) by mouth See admin instructions. Take 1 in the morning, 1 in evening and 3 at bedtime   amiodarone (PACERONE) 200 MG tablet TAKE 1 TABLET BY MOUTH EVERY DAY   cyclobenzaprine (FLEXERIL) 5 MG tablet Take 1 tablet (5 mg total) by mouth 3 (three) times daily as needed. (Patient not taking: Reported on 08/15/2022)   predniSONE (DELTASONE) 10 MG tablet Take 6 tablets  today, on day 2 take 5 tablets, day 3 take 4 tablets, day 4 take 3 tablets, day 5 take  2 tablets and 1 tablet the last day (Patient not taking: Reported on 05/17/2022)   rOPINIRole (REQUIP) 0.25 MG tablet Take 0.5 mg by mouth at bedtime. (Patient not taking: Reported on 05/17/2022)   No facility-administered medications prior to visit.    Review of Systems  Constitutional: Negative.   HENT: Negative.    Eyes: Negative.   Respiratory: Negative.    Cardiovascular: Negative.   Gastrointestinal: Negative.   Genitourinary: Negative.   Musculoskeletal:  Positive for joint pain.  Skin: Negative.   Neurological: Negative.   Endo/Heme/Allergies: Negative.        Objective:   BP 110/80   Pulse (!) 50   Ht 6' (1.829 m)   Wt 209 lb 12.8 oz (95.2 kg)   SpO2 97%   BMI 28.45 kg/m   Vitals:   01/13/23 1055  BP: 110/80  Pulse: (!) 50  Height: 6' (1.829 m)  Weight: 209 lb 12.8 oz (95.2 kg)  SpO2: 97%  BMI (Calculated): 28.45    Physical Exam Vitals reviewed.  Constitutional:      Appearance: Normal appearance.  HENT:     Head: Normocephalic.     Left Ear: There is no impacted cerumen.     Nose: Nose normal.     Mouth/Throat:     Mouth: Mucous membranes are moist.     Pharynx: No posterior oropharyngeal erythema.  Eyes:     Extraocular Movements: Extraocular movements intact.     Pupils: Pupils are equal, round, and reactive to light.  Cardiovascular:     Rate and Rhythm: Regular rhythm.     Chest Wall: PMI is not displaced.      Pulses: Normal pulses.     Heart sounds: Normal heart sounds. No murmur heard. Pulmonary:     Effort: Pulmonary effort is normal.     Breath sounds: Normal air entry. No rhonchi or rales.  Abdominal:  General: Abdomen is flat. Bowel sounds are normal. There is no distension.     Palpations: Abdomen is soft. There is no hepatomegaly, splenomegaly or mass.     Tenderness: There is no abdominal tenderness.  Musculoskeletal:        General: Swelling present. Normal range of motion.     Left wrist: Swelling and tenderness present. No effusion or lacerations.     Cervical back: Normal range of motion and neck supple.     Right lower leg: No edema.     Left lower leg: No edema.     Comments: Wrist swelling has improved  Skin:    General: Skin is warm and dry.  Neurological:     General: No focal deficit present.     Mental Status: He is alert and oriented to person, place, and time.     Cranial Nerves: No cranial nerve deficit.     Motor: No weakness.  Psychiatric:        Mood and Affect: Mood normal.        Behavior: Behavior normal.      No results found for any visits on 01/13/23.  Recent Results (from the past 2160 hour(Sharia Averitt))  PSA     Status: None   Collection Time: 01/11/23  8:50 AM  Result Value Ref Range   Prostate Specific Ag, Serum 0.7 0.0 - 4.0 ng/mL    Comment: Roche ECLIA methodology. According to the American Urological Association, Serum PSA should decrease and remain at undetectable levels after radical prostatectomy. The AUA defines biochemical recurrence as an initial PSA value 0.2 ng/mL or greater followed by a subsequent confirmatory PSA value 0.2 ng/mL or greater. Values obtained with different assay methods or kits cannot be used interchangeably. Results cannot be interpreted as absolute evidence of the presence or absence of malignant disease.   Lipid panel     Status: None   Collection Time: 01/11/23  8:50 AM  Result Value Ref Range   Cholesterol,  Total 137 100 - 199 mg/dL   Triglycerides 130 0 - 149 mg/dL   HDL 42 >86 mg/dL   VLDL Cholesterol Cal 23 5 - 40 mg/dL   LDL Chol Calc (NIH) 72 0 - 99 mg/dL   Chol/HDL Ratio 3.3 0.0 - 5.0 ratio    Comment:                                   T. Chol/HDL Ratio                                             Men  Women                               1/2 Avg.Risk  3.4    3.3                                   Avg.Risk  5.0    4.4                                2X Avg.Risk  9.6    7.1  3X Avg.Risk 23.4   11.0   TSH     Status: None   Collection Time: 01/11/23  8:50 AM  Result Value Ref Range   TSH 2.790 0.450 - 4.500 uIU/mL  Comprehensive metabolic panel     Status: Abnormal   Collection Time: 01/11/23  8:50 AM  Result Value Ref Range   Glucose 98 70 - 99 mg/dL   BUN 22 8 - 27 mg/dL   Creatinine, Ser 1.61 (H) 0.76 - 1.27 mg/dL   eGFR 43 (L) >09 UE/AVW/0.98   BUN/Creatinine Ratio 13 10 - 24   Sodium 140 134 - 144 mmol/L   Potassium 4.7 3.5 - 5.2 mmol/L   Chloride 106 96 - 106 mmol/L   CO2 21 20 - 29 mmol/L   Calcium 8.8 8.6 - 10.2 mg/dL   Total Protein 7.1 6.0 - 8.5 g/dL   Albumin 4.5 3.9 - 4.9 g/dL   Globulin, Total 2.6 1.5 - 4.5 g/dL   Bilirubin Total 0.3 0.0 - 1.2 mg/dL   Alkaline Phosphatase 65 44 - 121 IU/L   AST 35 0 - 40 IU/L   ALT 25 0 - 44 IU/L  CBC With Diff/Platelet     Status: Abnormal   Collection Time: 01/11/23  8:50 AM  Result Value Ref Range   WBC 6.0 3.4 - 10.8 x10E3/uL    Comment: **Effective January 23, 2023 profile 119147 WBC will be made**   non-orderable as a stand-alone order code.    RBC 4.63 4.14 - 5.80 x10E6/uL   Hemoglobin 12.9 (L) 13.0 - 17.7 g/dL   Hematocrit 82.9 56.2 - 51.0 %   MCV 87 79 - 97 fL   MCH 27.9 26.6 - 33.0 pg   MCHC 32.0 31.5 - 35.7 g/dL   RDW 13.0 86.5 - 78.4 %   Platelets 172 150 - 450 x10E3/uL   Neutrophils 63 Not Estab. %   Lymphs 25 Not Estab. %   Monocytes 8 Not Estab. %   Eos 3 Not Estab. %    Basos 1 Not Estab. %   Neutrophils Absolute 3.8 1.4 - 7.0 x10E3/uL   Lymphocytes Absolute 1.5 0.7 - 3.1 x10E3/uL   Monocytes Absolute 0.5 0.1 - 0.9 x10E3/uL   EOS (ABSOLUTE) 0.2 0.0 - 0.4 x10E3/uL   Basophils Absolute 0.0 0.0 - 0.2 x10E3/uL   Immature Granulocytes 0 Not Estab. %   Immature Grans (Abs) 0.0 0.0 - 0.1 x10E3/uL      Assessment & Plan:  As per problem list. Problem List Items Addressed This Visit       Endocrine   Hypothyroidism - Primary   Relevant Orders   TSH     Other   Hyperlipidemia   Relevant Orders   Lipid panel   Comprehensive metabolic panel   Other Visit Diagnoses     Generalized anxiety disorder       Relevant Medications   diazepam (VALIUM) 10 MG tablet   Restless legs syndrome       Relevant Medications   gabapentin (NEURONTIN) 300 MG capsule       Return in about 3 months (around 04/15/2023) for fu with labs prior.   Total time spent: 30 minutes  Luna Fuse, MD  01/13/2023   This document may have been prepared by [facility] Voice Recognition software and as such may include unintentional dictation errors.

## 2023-01-14 ENCOUNTER — Other Ambulatory Visit: Payer: Self-pay | Admitting: Internal Medicine

## 2023-01-14 DIAGNOSIS — J42 Unspecified chronic bronchitis: Secondary | ICD-10-CM

## 2023-01-27 ENCOUNTER — Other Ambulatory Visit: Payer: Self-pay | Admitting: Cardiovascular Disease

## 2023-01-27 ENCOUNTER — Other Ambulatory Visit: Payer: Self-pay | Admitting: Internal Medicine

## 2023-01-27 DIAGNOSIS — M25532 Pain in left wrist: Secondary | ICD-10-CM

## 2023-01-27 DIAGNOSIS — I480 Paroxysmal atrial fibrillation: Secondary | ICD-10-CM

## 2023-01-29 ENCOUNTER — Other Ambulatory Visit: Payer: Self-pay | Admitting: Cardiovascular Disease

## 2023-01-29 DIAGNOSIS — I10 Essential (primary) hypertension: Secondary | ICD-10-CM

## 2023-02-02 DIAGNOSIS — J449 Chronic obstructive pulmonary disease, unspecified: Secondary | ICD-10-CM | POA: Diagnosis not present

## 2023-02-03 ENCOUNTER — Other Ambulatory Visit: Payer: Self-pay | Admitting: Internal Medicine

## 2023-02-03 DIAGNOSIS — E782 Mixed hyperlipidemia: Secondary | ICD-10-CM

## 2023-02-06 ENCOUNTER — Other Ambulatory Visit: Payer: Self-pay | Admitting: Internal Medicine

## 2023-02-06 DIAGNOSIS — E781 Pure hyperglyceridemia: Secondary | ICD-10-CM

## 2023-02-24 ENCOUNTER — Other Ambulatory Visit: Payer: Self-pay | Admitting: Cardiovascular Disease

## 2023-02-24 DIAGNOSIS — I4891 Unspecified atrial fibrillation: Secondary | ICD-10-CM

## 2023-03-01 ENCOUNTER — Other Ambulatory Visit: Payer: Self-pay | Admitting: Internal Medicine

## 2023-03-01 DIAGNOSIS — J449 Chronic obstructive pulmonary disease, unspecified: Secondary | ICD-10-CM | POA: Diagnosis not present

## 2023-03-17 ENCOUNTER — Other Ambulatory Visit: Payer: 59

## 2023-03-17 DIAGNOSIS — E039 Hypothyroidism, unspecified: Secondary | ICD-10-CM

## 2023-03-17 DIAGNOSIS — E782 Mixed hyperlipidemia: Secondary | ICD-10-CM | POA: Diagnosis not present

## 2023-03-18 LAB — COMPREHENSIVE METABOLIC PANEL
ALT: 19 [IU]/L (ref 0–44)
AST: 28 [IU]/L (ref 0–40)
Albumin: 4.2 g/dL (ref 3.9–4.9)
Alkaline Phosphatase: 57 [IU]/L (ref 44–121)
BUN/Creatinine Ratio: 13 (ref 10–24)
BUN: 20 mg/dL (ref 8–27)
Bilirubin Total: 0.3 mg/dL (ref 0.0–1.2)
CO2: 22 mmol/L (ref 20–29)
Calcium: 9 mg/dL (ref 8.6–10.2)
Chloride: 105 mmol/L (ref 96–106)
Creatinine, Ser: 1.58 mg/dL — ABNORMAL HIGH (ref 0.76–1.27)
Globulin, Total: 2.5 g/dL (ref 1.5–4.5)
Glucose: 88 mg/dL (ref 70–99)
Potassium: 4.3 mmol/L (ref 3.5–5.2)
Sodium: 142 mmol/L (ref 134–144)
Total Protein: 6.7 g/dL (ref 6.0–8.5)
eGFR: 47 mL/min/{1.73_m2} — ABNORMAL LOW (ref >59)

## 2023-03-18 LAB — LIPID PANEL
Chol/HDL Ratio: 3.3 {ratio} (ref 0.0–5.0)
Cholesterol, Total: 126 mg/dL (ref 100–199)
HDL: 38 mg/dL — ABNORMAL LOW (ref >39)
LDL Chol Calc (NIH): 61 mg/dL (ref 0–99)
Triglycerides: 154 mg/dL — ABNORMAL HIGH (ref 0–149)
VLDL Cholesterol Cal: 27 mg/dL (ref 5–40)

## 2023-03-18 LAB — TSH: TSH: 1.79 u[IU]/mL (ref 0.450–4.500)

## 2023-03-25 ENCOUNTER — Other Ambulatory Visit: Payer: Self-pay | Admitting: Internal Medicine

## 2023-03-25 DIAGNOSIS — M25532 Pain in left wrist: Secondary | ICD-10-CM

## 2023-04-21 ENCOUNTER — Encounter: Payer: Self-pay | Admitting: Internal Medicine

## 2023-04-21 ENCOUNTER — Ambulatory Visit: Payer: 59 | Admitting: Internal Medicine

## 2023-04-21 VITALS — BP 110/73 | HR 61 | Temp 98.5°F | Ht 72.0 in | Wt 205.0 lb

## 2023-04-21 DIAGNOSIS — E782 Mixed hyperlipidemia: Secondary | ICD-10-CM | POA: Diagnosis not present

## 2023-04-21 DIAGNOSIS — E039 Hypothyroidism, unspecified: Secondary | ICD-10-CM

## 2023-04-21 DIAGNOSIS — J42 Unspecified chronic bronchitis: Secondary | ICD-10-CM | POA: Diagnosis not present

## 2023-04-21 DIAGNOSIS — G2581 Restless legs syndrome: Secondary | ICD-10-CM | POA: Diagnosis not present

## 2023-04-21 DIAGNOSIS — J410 Simple chronic bronchitis: Secondary | ICD-10-CM

## 2023-04-21 DIAGNOSIS — Z87891 Personal history of nicotine dependence: Secondary | ICD-10-CM | POA: Diagnosis not present

## 2023-04-21 DIAGNOSIS — N189 Chronic kidney disease, unspecified: Secondary | ICD-10-CM | POA: Diagnosis not present

## 2023-04-21 MED ORDER — TRELEGY ELLIPTA 100-62.5-25 MCG/ACT IN AEPB: 1.0000 | INHALATION_SPRAY | Freq: Every day | RESPIRATORY_TRACT | 1 refills | Status: DC

## 2023-04-21 MED ORDER — GABAPENTIN 300 MG PO CAPS: 300.0000 mg | ORAL_CAPSULE | ORAL | 2 refills | Status: DC

## 2023-04-21 MED ORDER — FUROSEMIDE 20 MG PO TABS: 20.0000 mg | ORAL_TABLET | Freq: Every day | ORAL | 1 refills | Status: DC

## 2023-04-21 MED ORDER — IPRATROPIUM-ALBUTEROL 0.5-2.5 (3) MG/3ML IN SOLN: 3.0000 mL | Freq: Four times a day (QID) | RESPIRATORY_TRACT | 11 refills | Status: DC | PRN

## 2023-04-21 NOTE — Progress Notes (Signed)
 Established Patient Office Visit  Subjective:  Patient ID: Perry Martinez, male    DOB: December 21, 1955  Age: 68 y.o. MRN: [id number]  Chief Complaint  Patient presents with   Follow-up    3 month follow up     No new complaints, here for lab review and medication refills. Labs reviewed and notable for well controlled lipids, stable ckd 3a and normal TSH.    No other concerns at this time.   Past Medical History:  Diagnosis Date   Acute blood loss anemia 04/09/2020   Acute CVA (cerebrovascular accident) (HCC) 08/09/2018   AKI (acute kidney injury) (HCC) 04/09/2020   Atrial fibrillation (HCC)    Depression    Hyperlipidemia    Hypertension    Hypothyroidism 06/28/2016   OSA on CPAP 06/16/2020   Substance abuse (HCC)    IV drugs when younger    Past Surgical History:  Procedure Laterality Date   COLONOSCOPY N/A 04/11/2020   Procedure: COLONOSCOPY;  Surgeon: Toledo, Boykin Nearing, MD;  Location: [facility];  Service: Gastroenterology;  Laterality: N/A;   ESOPHAGOGASTRODUODENOSCOPY N/A 04/11/2020   Procedure: ESOPHAGOGASTRODUODENOSCOPY (EGD);  Surgeon: Toledo, Boykin Nearing, MD;  Location: [facility];  Service: Gastroenterology;  Laterality: N/A;   FRACTURE SURGERY     right foot   ROTATOR CUFF REPAIR     right shoulder    Social History   Socioeconomic History   Marital status: Single    Spouse name: [name]   Number of children: 2   Years of education: GED/ tech   Highest education level: Not on file  Occupational History   Occupation: build houses    Comment: self  Tobacco Use   Smoking status: Every Day    Current packs/day: 0.50    Average packs/day: 0.5 packs/day for 56.8 years (28.4 ttl pk-yrs)    Types: Cigarettes    Start date: 06/28/1966   Smokeless tobacco: Never   Tobacco comments:    previously smoked 1ppd  Vaping Use   Vaping status: Never Used  Substance and Sexual Activity   Alcohol use: No   Drug use: No   Sexual activity: Not Currently  Other  Topics Concern   Not on file  Social History Narrative   Lives with Ursula Alert and her 19 year old daughter and 76 year old son   Self employed   - build houses   Social Drivers of Corporate investment banker Strain: Not on file  Food Insecurity: No Food Insecurity (07/14/2022)   Hunger Vital Sign    Worried About Running Out of Food in the Last Year: Never true    Ran Out of Food in the Last Year: Never true  Transportation Needs: Not on file  Physical Activity: Not on file  Stress: Not on file  Social Connections: Not on file  Intimate Partner Violence: Not At Risk (07/14/2022)   Humiliation, Afraid, Rape, and Kick questionnaire    Fear of Current or Ex-Partner: No    Emotionally Abused: No    Physically Abused: No    Sexually Abused: No    Family History  Problem Relation Age of Onset   Heart disease Mother        dieed at 54   Arthritis Mother    Diabetes Mother    Heart disease Father 46       bypass   Arthritis Father    Hyperlipidemia Father    Hypertension Father    Rheumatic fever Father  Allergies  Allergen Reactions   Pollen Extract     Other reaction(Moody Robben): Cough    Outpatient Medications Prior to Visit  Medication Sig   amiodarone (PACERONE) 200 MG tablet TAKE 1 TABLET BY MOUTH EVERY DAY   celecoxib (CELEBREX) 400 MG capsule Take 1 capsule (400 mg total) by mouth daily after breakfast.   cyclobenzaprine (FLEXERIL) 5 MG tablet Take 1 tablet (5 mg total) by mouth 3 (three) times daily as needed. (Patient not taking: Reported on 08/15/2022)   diazepam (VALIUM) 10 MG tablet Take 1 tablet (10 mg total) by mouth 3 (three) times daily.   ELIQUIS 5 MG TABS tablet TAKE 1 TABLET BY MOUTH TWICE A DAY   fenofibrate 160 MG tablet TAKE 1 TABLET BY MOUTH EVERY DAY   levothyroxine (SYNTHROID) 150 MCG tablet TAKE 1 TABLET BY MOUTH EVERY DAY IN THE MORNING   losartan (COZAAR) 25 MG tablet TAKE 1 TABLET BY MOUTH EVERY DAY   metoprolol succinate (TOPROL-XL) 25 MG 24 hr tablet  TAKE 1 TABLET BY MOUTH EVERY DAY   predniSONE (DELTASONE) 10 MG tablet Take 6 tablets  today, on day 2 take 5 tablets, day 3 take 4 tablets, day 4 take 3 tablets, day 5 take  2 tablets and 1 tablet the last day (Patient not taking: Reported on 05/17/2022)   rOPINIRole (REQUIP) 0.25 MG tablet Take 0.5 mg by mouth at bedtime. (Patient not taking: Reported on 05/17/2022)   rosuvastatin (CRESTOR) 40 MG tablet TAKE 1 TABLET BY MOUTH EVERY DAY   [DISCONTINUED] furosemide (LASIX) 20 MG tablet TAKE 1 TABLET BY MOUTH EVERY DAY   [DISCONTINUED] gabapentin (NEURONTIN) 300 MG capsule Take 1 capsule (300 mg total) by mouth See admin instructions. Take 1 in the morning, 1 in evening and 3 at bedtime   [DISCONTINUED] ipratropium-albuterol (DUONEB) 0.5-2.5 (3) MG/3ML SOLN USE 1 VIAL IN NEBULIZER 4 TIMES DAILY   [DISCONTINUED] TRELEGY ELLIPTA 100-62.5-25 MCG/ACT AEPB USE 1 INHALTION BY MOUTH EVERY DAY AS DIRECTED   No facility-administered medications prior to visit.    ROS     Objective:   BP 110/73   Pulse 61   Temp 98.5 F (36.9 C)   Ht 6' (1.829 m)   Wt 205 lb (93 kg)   SpO2 98%   BMI 27.80 kg/m   Vitals:   04/21/23 0944  BP: 110/73  Pulse: 61  Temp: 98.5 F (36.9 C)  Height: 6' (1.829 m)  Weight: 205 lb (93 kg)  SpO2: 98%  BMI (Calculated): 27.8    Physical Exam   No results found for any visits on 04/21/23.  Recent Results (from the past 2160 hours)  Comprehensive metabolic panel     Status: Abnormal   Collection Time: 03/17/23 10:15 AM  Result Value Ref Range   Glucose 88 70 - 99 mg/dL   BUN 20 8 - 27 mg/dL   Creatinine, Ser 1.61 (H) 0.76 - 1.27 mg/dL   eGFR 47 (L) >09 UE/AVW/0.98   BUN/Creatinine Ratio 13 10 - 24   Sodium 142 134 - 144 mmol/L   Potassium 4.3 3.5 - 5.2 mmol/L   Chloride 105 96 - 106 mmol/L   CO2 22 20 - 29 mmol/L   Calcium 9.0 8.6 - 10.2 mg/dL   Total Protein 6.7 6.0 - 8.5 g/dL   Albumin 4.2 3.9 - 4.9 g/dL   Globulin, Total 2.5 1.5 - 4.5 g/dL    Bilirubin Total 0.3 0.0 - 1.2 mg/dL   Alkaline Phosphatase 57 44 -  121 IU/L   AST 28 0 - 40 IU/L   ALT 19 0 - 44 IU/L  Lipid panel     Status: Abnormal   Collection Time: 03/17/23 10:15 AM  Result Value Ref Range   Cholesterol, Total 126 100 - 199 mg/dL   Triglycerides 981 (H) 0 - 149 mg/dL   HDL 38 (L) >19 mg/dL   VLDL Cholesterol Cal 27 5 - 40 mg/dL   LDL Chol Calc (NIH) 61 0 - 99 mg/dL   Chol/HDL Ratio 3.3 0.0 - 5.0 ratio    Comment:                                   T. Chol/HDL Ratio                                             Men  Women                               1/2 Avg.Risk  3.4    3.3                                   Avg.Risk  5.0    4.4                                2X Avg.Risk  9.6    7.1                                3X Avg.Risk 23.4   11.0   TSH     Status: None   Collection Time: 03/17/23 10:15 AM  Result Value Ref Range   TSH 1.790 0.450 - 4.500 uIU/mL      Assessment & Plan:  As per problem list  Problem List Items Addressed This Visit       Endocrine   Hypothyroidism   Relevant Orders   TSH     Other   Hyperlipidemia   Relevant Medications   furosemide (LASIX) 20 MG tablet   Other Relevant Orders   Comprehensive metabolic panel   Lipid panel   Other Visit Diagnoses       Simple chronic bronchitis (HCC)    -  Primary   Relevant Medications   ipratropium-albuterol (DUONEB) 0.5-2.5 (3) MG/3ML SOLN     Chronic kidney disease, unspecified       Relevant Medications   furosemide (LASIX) 20 MG tablet     Unspecified chronic bronchitis (HCC)       Relevant Medications   Fluticasone-Umeclidin-Vilant (TRELEGY ELLIPTA) 100-62.5-25 MCG/ACT AEPB     Restless legs syndrome       Relevant Medications   gabapentin (NEURONTIN) 300 MG capsule     History of tobacco abuse       Relevant Orders   CT CHEST LUNG CA SCREEN LOW DOSE W/O CM       Return in about 3 months (around 07/19/2023) for fu with labs prior.   Total time spent: 20  minutes  Luna Fuse, MD  04/21/2023  This document may have been prepared by [employer] Voice Recognition software and as such may include unintentional dictation errors.

## 2023-04-28 ENCOUNTER — Other Ambulatory Visit: Payer: Self-pay | Admitting: Cardiovascular Disease

## 2023-04-28 ENCOUNTER — Other Ambulatory Visit: Payer: Self-pay | Admitting: Internal Medicine

## 2023-04-28 DIAGNOSIS — I10 Essential (primary) hypertension: Secondary | ICD-10-CM

## 2023-04-28 DIAGNOSIS — I480 Paroxysmal atrial fibrillation: Secondary | ICD-10-CM

## 2023-04-28 DIAGNOSIS — E039 Hypothyroidism, unspecified: Secondary | ICD-10-CM

## 2023-05-02 ENCOUNTER — Ambulatory Visit: Payer: 59

## 2023-05-02 DIAGNOSIS — Z87891 Personal history of nicotine dependence: Secondary | ICD-10-CM

## 2023-05-02 DIAGNOSIS — F17210 Nicotine dependence, cigarettes, uncomplicated: Secondary | ICD-10-CM | POA: Diagnosis not present

## 2023-05-06 ENCOUNTER — Other Ambulatory Visit: Payer: Self-pay | Admitting: Internal Medicine

## 2023-05-06 DIAGNOSIS — F411 Generalized anxiety disorder: Secondary | ICD-10-CM

## 2023-05-08 ENCOUNTER — Encounter: Payer: Self-pay | Admitting: Internal Medicine

## 2023-05-08 NOTE — Progress Notes (Signed)
 Patient informed.

## 2023-05-09 ENCOUNTER — Ambulatory Visit (INDEPENDENT_AMBULATORY_CARE_PROVIDER_SITE_OTHER): Payer: Medicare Other | Admitting: Nurse Practitioner

## 2023-05-09 ENCOUNTER — Encounter (INDEPENDENT_AMBULATORY_CARE_PROVIDER_SITE_OTHER): Payer: Medicare Other

## 2023-06-01 ENCOUNTER — Telehealth (INDEPENDENT_AMBULATORY_CARE_PROVIDER_SITE_OTHER): Payer: Self-pay

## 2023-06-01 NOTE — Telephone Encounter (Signed)
 Patient left a message stating that when walk he is experiencing stabbing pain behind the knee cap for past two days. Patient right leg is swollen above the knee and is not able to bare weight on the right leg. Patient was last seen 04/2022 and has upcoming appointment on 06/14/23. He is elevating and wearing ace bandage. Please advise

## 2023-06-01 NOTE — Telephone Encounter (Signed)
 The pain is atypical for vascular pain however cannot necessarily rule out that it is a vascular cause.  We can see if there is a sooner appointment with the associated studies to rule that out.  Given that the pain is starting in the knee itself I would also reach out to his primary care provider for evaluation to see if is not a strained muscle or ligament or tear in the knee itself (which is not something we would treat)

## 2023-06-01 NOTE — Telephone Encounter (Signed)
 Patient notified with medical recommendations and verbalized understanding. Patient was informed that the front receptionist will reach about appointment.

## 2023-06-02 ENCOUNTER — Other Ambulatory Visit (INDEPENDENT_AMBULATORY_CARE_PROVIDER_SITE_OTHER): Payer: Self-pay | Admitting: Vascular Surgery

## 2023-06-02 ENCOUNTER — Ambulatory Visit (INDEPENDENT_AMBULATORY_CARE_PROVIDER_SITE_OTHER)

## 2023-06-02 DIAGNOSIS — I70213 Atherosclerosis of native arteries of extremities with intermittent claudication, bilateral legs: Secondary | ICD-10-CM | POA: Diagnosis not present

## 2023-06-07 LAB — VAS US ABI WITH/WO TBI
Left ABI: 0.96
Right ABI: 0.72

## 2023-06-13 ENCOUNTER — Ambulatory Visit (INDEPENDENT_AMBULATORY_CARE_PROVIDER_SITE_OTHER): Admitting: Internal Medicine

## 2023-06-13 ENCOUNTER — Encounter: Payer: Self-pay | Admitting: Internal Medicine

## 2023-06-13 VITALS — BP 115/70 | HR 54 | Temp 96.8°F | Ht 72.0 in | Wt 208.8 lb

## 2023-06-13 DIAGNOSIS — M25561 Pain in right knee: Secondary | ICD-10-CM | POA: Diagnosis not present

## 2023-06-13 DIAGNOSIS — S8391XA Sprain of unspecified site of right knee, initial encounter: Secondary | ICD-10-CM | POA: Insufficient documentation

## 2023-06-13 DIAGNOSIS — E039 Hypothyroidism, unspecified: Secondary | ICD-10-CM

## 2023-06-13 DIAGNOSIS — Z0130 Encounter for examination of blood pressure without abnormal findings: Secondary | ICD-10-CM | POA: Diagnosis not present

## 2023-06-13 DIAGNOSIS — S8391XD Sprain of unspecified site of right knee, subsequent encounter: Secondary | ICD-10-CM

## 2023-06-13 DIAGNOSIS — E782 Mixed hyperlipidemia: Secondary | ICD-10-CM | POA: Diagnosis not present

## 2023-06-13 NOTE — Progress Notes (Signed)
 Established Patient Office Visit  Subjective:  Patient ID: Perry Martinez, male    DOB: 1955-02-25  Age: 68 y.o. MRN: [id number]  Chief Complaint  Patient presents with   Acute Visit    Right leg pain    Still c/o right knee pain unrelieved by Celecoxib  and ES APAP. Saw ortho who ordered meloxicam despite presently being on Meloxicam. No longer on his knees daily per ortho recommendations.     No other concerns at this time.   Past Medical History:  Diagnosis Date   Acute blood loss anemia 04/09/2020   Acute CVA (cerebrovascular accident) (HCC) 08/09/2018   AKI (acute kidney injury) (HCC) 04/09/2020   Atrial fibrillation (HCC)    Depression    Hyperlipidemia    Hypertension    Hypothyroidism 06/28/2016   OSA on CPAP 06/16/2020   Substance abuse (HCC)    IV drugs when younger    Past Surgical History:  Procedure Laterality Date   COLONOSCOPY N/A 04/11/2020   Procedure: COLONOSCOPY;  Surgeon: Toledo, Alphonsus Jeans, MD;  Location: [facility];  Service: Gastroenterology;  Laterality: N/A;   ESOPHAGOGASTRODUODENOSCOPY N/A 04/11/2020   Procedure: ESOPHAGOGASTRODUODENOSCOPY (EGD);  Surgeon: Toledo, Alphonsus Jeans, MD;  Location: [facility];  Service: Gastroenterology;  Laterality: N/A;   FRACTURE SURGERY     right foot   ROTATOR CUFF REPAIR     right shoulder    Social History   Socioeconomic History   Marital status: Single    Spouse name: [name]   Number of children: 2   Years of education: GED/ tech   Highest education level: Not on file  Occupational History   Occupation: build houses    Comment: self  Tobacco Use   Smoking status: Every Day    Current packs/day: 0.50    Average packs/day: 0.5 packs/day for 57.0 years (28.5 ttl pk-yrs)    Types: Cigarettes    Start date: 06/28/1966   Smokeless tobacco: Never   Tobacco comments:    previously smoked 1ppd  Vaping Use   Vaping status: Never Used  Substance and Sexual Activity   Alcohol use: No   Drug use: No    Sexual activity: Not Currently  Other Topics Concern   Not on file  Social History Narrative   Lives with Odette Benjamin and her 74 year old daughter and 60 year old son   Self employed   - build houses   Social Drivers of Corporate investment banker Strain: Not on file  Food Insecurity: No Food Insecurity (07/14/2022)   Hunger Vital Sign    Worried About Running Out of Food in the Last Year: Never true    Ran Out of Food in the Last Year: Never true  Transportation Needs: Not on file  Physical Activity: Not on file  Stress: Not on file  Social Connections: Not on file  Intimate Partner Violence: Not At Risk (07/14/2022)   Humiliation, Afraid, Rape, and Kick questionnaire    Fear of Current or Ex-Partner: No    Emotionally Abused: No    Physically Abused: No    Sexually Abused: No    Family History  Problem Relation Age of Onset   Heart disease Mother        dieed at 92   Arthritis Mother    Diabetes Mother    Heart disease Father 19       bypass   Arthritis Father    Hyperlipidemia Father    Hypertension Father  Rheumatic fever Father     Allergies  Allergen Reactions   Pollen Extract     Other reaction(Ashliegh Parekh): Cough    Outpatient Medications Prior to Visit  Medication Sig   amiodarone  (PACERONE ) 200 MG tablet TAKE 1 TABLET BY MOUTH EVERY DAY   celecoxib  (CELEBREX ) 400 MG capsule Take 1 capsule (400 mg total) by mouth daily after breakfast.   diazepam  (VALIUM ) 10 MG tablet TAKE 1 TABLET BY MOUTH THREE TIMES A DAY   ELIQUIS  5 MG TABS tablet TAKE 1 TABLET BY MOUTH TWICE A DAY   fenofibrate  160 MG tablet TAKE 1 TABLET BY MOUTH EVERY DAY   Fluticasone-Umeclidin-Vilant (TRELEGY ELLIPTA ) 100-62.5-25 MCG/ACT AEPB Take 1 Inhalation by mouth daily. USE 1 INHALTION BY MOUTH EVERY DAY AS DIRECTED   furosemide  (LASIX ) 20 MG tablet Take 1 tablet (20 mg total) by mouth daily.   gabapentin  (NEURONTIN ) 300 MG capsule Take 1 capsule (300 mg total) by mouth See admin instructions. Take 1 in  the morning, 1 in evening and 3 at bedtime   ipratropium-albuterol  (DUONEB) 0.5-2.5 (3) MG/3ML SOLN Take 3 mLs by nebulization every 6 (six) hours as needed.   levothyroxine  (SYNTHROID ) 150 MCG tablet TAKE 1 TABLET BY MOUTH EVERY DAY IN THE MORNING   losartan (COZAAR) 25 MG tablet TAKE 1 TABLET BY MOUTH EVERY DAY   metoprolol  succinate (TOPROL -XL) 25 MG 24 hr tablet TAKE 1 TABLET BY MOUTH EVERY DAY   rosuvastatin  (CRESTOR ) 40 MG tablet TAKE 1 TABLET BY MOUTH EVERY DAY   cyclobenzaprine  (FLEXERIL ) 5 MG tablet Take 1 tablet (5 mg total) by mouth 3 (three) times daily as needed. (Patient not taking: Reported on 06/13/2023)   predniSONE  (DELTASONE ) 10 MG tablet Take 6 tablets  today, on day 2 take 5 tablets, day 3 take 4 tablets, day 4 take 3 tablets, day 5 take  2 tablets and 1 tablet the last day (Patient not taking: Reported on 06/13/2023)   rOPINIRole  (REQUIP ) 0.25 MG tablet Take 0.5 mg by mouth at bedtime. (Patient not taking: Reported on 05/17/2022)   No facility-administered medications prior to visit.    Review of Systems  Constitutional: Negative.   HENT: Negative.    Eyes: Negative.   Respiratory: Negative.    Cardiovascular: Negative.   Gastrointestinal: Negative.   Genitourinary: Negative.   Musculoskeletal:  Positive for joint pain.  Skin: Negative.   Neurological: Negative.   Endo/Heme/Allergies: Negative.        Objective:   BP 115/70   Pulse (!) 54   Temp (!) 96.8 F (36 C)   Ht 6' (1.829 m)   Wt 208 lb 12.8 oz (94.7 kg)   SpO2 94%   BMI 28.32 kg/m   Vitals:   06/13/23 1525  BP: 115/70  Pulse: (!) 54  Temp: (!) 96.8 F (36 C)  Height: 6' (1.829 m)  Weight: 208 lb 12.8 oz (94.7 kg)  SpO2: 94%  BMI (Calculated): 28.31    Physical Exam Vitals reviewed.  Constitutional:      Appearance: Normal appearance.  HENT:     Head: Normocephalic.     Left Ear: There is no impacted cerumen.     Nose: Nose normal.     Mouth/Throat:     Mouth: Mucous membranes  are moist.     Pharynx: No posterior oropharyngeal erythema.  Eyes:     Extraocular Movements: Extraocular movements intact.     Pupils: Pupils are equal, round, and reactive to light.  Cardiovascular:  Rate and Rhythm: Regular rhythm.     Chest Wall: PMI is not displaced.     Pulses: Normal pulses.     Heart sounds: Normal heart sounds. No murmur heard. Pulmonary:     Effort: Pulmonary effort is normal.     Breath sounds: Normal air entry. No rhonchi or rales.  Abdominal:     General: Abdomen is flat. Bowel sounds are normal. There is no distension.     Palpations: Abdomen is soft. There is no hepatomegaly, splenomegaly or mass.     Tenderness: There is no abdominal tenderness.  Musculoskeletal:        General: Swelling present. Normal range of motion.     Left wrist: Swelling and tenderness present. No effusion or lacerations.     Cervical back: Normal range of motion and neck supple.     Right lower leg: No edema.     Left lower leg: No edema.     Comments: Wrist swelling has improved  Skin:    General: Skin is warm and dry.  Neurological:     General: No focal deficit present.     Mental Status: He is alert and oriented to person, place, and time.     Cranial Nerves: No cranial nerve deficit.     Motor: No weakness.  Psychiatric:        Mood and Affect: Mood normal.        Behavior: Behavior normal.      No results found for any visits on 06/13/23.  Recent Results (from the past 2160 hours)  Comprehensive metabolic panel     Status: Abnormal   Collection Time: 03/17/23 10:15 AM  Result Value Ref Range   Glucose 88 70 - 99 mg/dL   BUN 20 8 - 27 mg/dL   Creatinine, Ser 1.61 (H) 0.76 - 1.27 mg/dL   eGFR 47 (L) >09 UE/AVW/0.98   BUN/Creatinine Ratio 13 10 - 24   Sodium 142 134 - 144 mmol/L   Potassium 4.3 3.5 - 5.2 mmol/L   Chloride 105 96 - 106 mmol/L   CO2 22 20 - 29 mmol/L   Calcium  9.0 8.6 - 10.2 mg/dL   Total Protein 6.7 6.0 - 8.5 g/dL   Albumin 4.2 3.9  - 4.9 g/dL   Globulin, Total 2.5 1.5 - 4.5 g/dL   Bilirubin Total 0.3 0.0 - 1.2 mg/dL   Alkaline Phosphatase 57 44 - 121 IU/L   AST 28 0 - 40 IU/L   ALT 19 0 - 44 IU/L  Lipid panel     Status: Abnormal   Collection Time: 03/17/23 10:15 AM  Result Value Ref Range   Cholesterol, Total 126 100 - 199 mg/dL   Triglycerides 119 (H) 0 - 149 mg/dL   HDL 38 (L) >14 mg/dL   VLDL Cholesterol Cal 27 5 - 40 mg/dL   LDL Chol Calc (NIH) 61 0 - 99 mg/dL   Chol/HDL Ratio 3.3 0.0 - 5.0 ratio    Comment:                                   T. Chol/HDL Ratio                                             Men  Women  1/2 Avg.Risk  3.4    3.3                                   Avg.Risk  5.0    4.4                                2X Avg.Risk  9.6    7.1                                3X Avg.Risk 23.4   11.0   TSH     Status: None   Collection Time: 03/17/23 10:15 AM  Result Value Ref Range   TSH 1.790 0.450 - 4.500 uIU/mL  VAS US  ABI WITH/WO TBI     Status: None   Collection Time: 06/02/23  9:52 AM  Result Value Ref Range   Right ABI .72    Left ABI .96       Assessment & Plan:  As per problem list. Recommend intra-articular injection as Meloxicam contraindicated with Eliquis . Problem List Items Addressed This Visit       Endocrine   Hypothyroidism     Musculoskeletal and Integument   Sprain of right knee - Primary     Other   Hyperlipidemia    Return in about 2 months (around 08/13/2023) for fu with labs prior.   Total time spent: 20 minutes  Arzella Bitters, MD  06/13/2023   This document may have been prepared by [facility] Voice Recognition software and as such may include unintentional dictation errors.

## 2023-06-14 ENCOUNTER — Encounter (INDEPENDENT_AMBULATORY_CARE_PROVIDER_SITE_OTHER)

## 2023-06-14 ENCOUNTER — Encounter (INDEPENDENT_AMBULATORY_CARE_PROVIDER_SITE_OTHER): Payer: Self-pay | Admitting: Nurse Practitioner

## 2023-06-14 ENCOUNTER — Ambulatory Visit (INDEPENDENT_AMBULATORY_CARE_PROVIDER_SITE_OTHER): Admitting: Nurse Practitioner

## 2023-06-14 VITALS — BP 123/79 | HR 52 | Resp 18 | Ht 72.0 in | Wt 212.6 lb

## 2023-06-14 DIAGNOSIS — E785 Hyperlipidemia, unspecified: Secondary | ICD-10-CM

## 2023-06-14 DIAGNOSIS — M25561 Pain in right knee: Secondary | ICD-10-CM | POA: Diagnosis not present

## 2023-06-14 DIAGNOSIS — I70213 Atherosclerosis of native arteries of extremities with intermittent claudication, bilateral legs: Secondary | ICD-10-CM

## 2023-06-14 DIAGNOSIS — S8391XA Sprain of unspecified site of right knee, initial encounter: Secondary | ICD-10-CM

## 2023-06-19 NOTE — Progress Notes (Signed)
 Subjective:    Patient ID: Perry Martinez, male    DOB: [birth year], 68 y.o.   MRN: [id number] Chief Complaint  Patient presents with   Follow-up    f/u in 1 year with abi -    The patient returns today for follow-up evaluation of his peripheral arterial disease: A bit sooner than normal.  He notes that he was doing some work and was able kneeling position for an extended time.  He began to have stabbing pain behind his right knee and has had difficulty bearing weight on the knee itself.  There was also swollen right around and above the knee itself.  It was different from the claudication pain that he normally has which is located more so in his calf area.  He has a right ABI 0.72 and a left of 0.96.  He also has biphasic tibial waveforms bilaterally with slightly dampened toe waveforms on the right    Review of Systems  Musculoskeletal:  Positive for arthralgias, gait problem and joint swelling.  All other systems reviewed and are negative.      Objective:   Physical Exam Vitals reviewed.  HENT:     Head: Normocephalic.  Cardiovascular:     Rate and Rhythm: Normal rate.     Pulses:          Dorsalis pedis pulses are detected w/ Doppler on the right side and detected w/ Doppler on the left side.       Posterior tibial pulses are detected w/ Doppler on the right side and detected w/ Doppler on the left side.  Pulmonary:     Effort: Pulmonary effort is normal.  Skin:    General: Skin is warm and dry.  Neurological:     Mental Status: He is alert and oriented to person, place, and time.  Psychiatric:        Mood and Affect: Mood normal.        Behavior: Behavior normal.        Thought Content: Thought content normal.        Judgment: Judgment normal.     BP 123/79   Pulse (!) 52   Resp 18   Ht 6' (1.829 m)   Wt 212 lb 9.6 oz (96.4 kg)   BMI 28.83 kg/m   Past Medical History:  Diagnosis Date   Acute blood loss anemia 04/09/2020   Acute CVA (cerebrovascular  accident) (HCC) 08/09/2018   AKI (acute kidney injury) (HCC) 04/09/2020   Atrial fibrillation (HCC)    Depression    Hyperlipidemia    Hypertension    Hypothyroidism 06/28/2016   OSA on CPAP 06/16/2020   Substance abuse (HCC)    IV drugs when younger    Social History   Socioeconomic History   Marital status: Single    Spouse name: [name]   Number of children: 2   Years of education: GED/ tech   Highest education level: Not on file  Occupational History   Occupation: build houses    Comment: self  Tobacco Use   Smoking status: Every Day    Current packs/day: 0.50    Average packs/day: 0.5 packs/day for 57.0 years (28.5 ttl pk-yrs)    Types: Cigarettes    Start date: 06/28/1966   Smokeless tobacco: Never   Tobacco comments:    previously smoked 1ppd  Vaping Use   Vaping status: Never Used  Substance and Sexual Activity   Alcohol use: No   Drug use: No  Sexual activity: Not Currently  Other Topics Concern   Not on file  Social History Narrative   Lives with Perry Martinez and her 48 year old daughter and 64 year old son   Self employed   - build houses   Social Drivers of Corporate investment banker Strain: Not on file  Food Insecurity: No Food Insecurity (07/14/2022)   Hunger Vital Sign    Worried About Running Out of Food in the Last Year: Never true    Ran Out of Food in the Last Year: Never true  Transportation Needs: Not on file  Physical Activity: Not on file  Stress: Not on file  Social Connections: Not on file  Intimate Partner Violence: Not At Risk (07/14/2022)   Humiliation, Afraid, Rape, and Kick questionnaire    Fear of Current or Ex-Partner: No    Emotionally Abused: No    Physically Abused: No    Sexually Abused: No    Past Surgical History:  Procedure Laterality Date   COLONOSCOPY N/A 04/11/2020   Procedure: COLONOSCOPY;  Surgeon: Toledo, Alphonsus Jeans, MD;  Location: [facility];  Service: Gastroenterology;  Laterality: N/A;   ESOPHAGOGASTRODUODENOSCOPY  N/A 04/11/2020   Procedure: ESOPHAGOGASTRODUODENOSCOPY (EGD);  Surgeon: Toledo, Alphonsus Jeans, MD;  Location: [facility];  Service: Gastroenterology;  Laterality: N/A;   FRACTURE SURGERY     right foot   ROTATOR CUFF REPAIR     right shoulder    Family History  Problem Relation Age of Onset   Heart disease Mother        dieed at 73   Arthritis Mother    Diabetes Mother    Heart disease Father 48       bypass   Arthritis Father    Hyperlipidemia Father    Hypertension Father    Rheumatic fever Father     Allergies  Allergen Reactions   Pollen Extract     Other reaction(s): Cough       Latest Ref Rng & Units 01/11/2023    8:50 AM 05/24/2022    1:52 PM 04/12/2020    4:02 PM  CBC  WBC 3.4 - 10.8 x10E3/uL 6.0  10.9    Hemoglobin 13.0 - 17.7 g/dL 44.0  34.7  8.5   Hematocrit 37.5 - 51.0 % 40.3  37.4  29.8   Platelets 150 - 450 x10E3/uL 172  204        CMP     Component Value Date/Time   NA 142 03/17/2023 1015   K 4.3 03/17/2023 1015   CL 105 03/17/2023 1015   CO2 22 03/17/2023 1015   GLUCOSE 88 03/17/2023 1015   GLUCOSE 89 04/12/2020 0410   BUN 20 03/17/2023 1015   CREATININE 1.58 (H) 03/17/2023 1015   CREATININE 1.27 (H) 06/27/2016 1120   CALCIUM  9.0 03/17/2023 1015   PROT 6.7 03/17/2023 1015   ALBUMIN 4.2 03/17/2023 1015   AST 28 03/17/2023 1015   ALT 19 03/17/2023 1015   ALKPHOS 57 03/17/2023 1015   BILITOT 0.3 03/17/2023 1015   EGFR 47 (L) 03/17/2023 1015   GFRNONAA 59 (L) 04/12/2020 0410   GFRNONAA 60 06/27/2016 1120     VAS US  ABI WITH/WO TBI Result Date: 06/07/2023  LOWER EXTREMITY DOPPLER STUDY Patient Name:  Perry Martinez  Date of Exam:   06/02/2023 Medical Rec #: [id number]         Accession #:    [id number] Date of Birth: 10-Aug-1955  Patient Gender: M Patient Age:   62 years Exam Location:  [facility] Procedure:      VAS US  ABI WITH/WO TBI Referring Phys: Devon Fogo  --------------------------------------------------------------------------------  Indications: Peripheral artery disease.  Comparison Study: 05/09/2022 Performing Technologist: Tonie Franks RVS  Examination Guidelines: A complete evaluation includes at minimum, Doppler waveform signals and systolic blood pressure reading at the level of bilateral brachial, anterior tibial, and posterior tibial arteries, when vessel segments are accessible. Bilateral testing is considered an integral part of a complete examination. Photoelectric Plethysmograph (PPG) waveforms and toe systolic pressure readings are included as required and additional duplex testing as needed. Limited examinations for reoccurring indications may be performed as noted.  ABI Findings: +---------+------------------+-----+--------+--------+ Right    Rt Pressure (mmHg)IndexWaveformComment  +---------+------------------+-----+--------+--------+ Brachial 112                                     +---------+------------------+-----+--------+--------+ ATA      92                0.72 biphasic         +---------+------------------+-----+--------+--------+ PTA      90                0.71 biphasic         +---------+------------------+-----+--------+--------+ Great Toe77                0.61 Abnormal         +---------+------------------+-----+--------+--------+ +---------+------------------+-----+--------+-------+ Left     Lt Pressure (mmHg)IndexWaveformComment +---------+------------------+-----+--------+-------+ Brachial 127                                    +---------+------------------+-----+--------+-------+ ATA      122               0.96 biphasic        +---------+------------------+-----+--------+-------+ PTA      119               0.94 biphasic        +---------+------------------+-----+--------+-------+ Great Toe112               0.88 Normal           +---------+------------------+-----+--------+-------+ +-------+-----------+-----------+------------+------------+ ABI/TBIToday's ABIToday's TBIPrevious ABIPrevious TBI +-------+-----------+-----------+------------+------------+ Right  .72        .61        .89         .60          +-------+-----------+-----------+------------+------------+ Left   .96        .88        1.08        .94          +-------+-----------+-----------+------------+------------+ Bilateral ABIs appear decreased compared to prior study on 05/09/2022. Bilateral TBIs appear decreased compared to prior study on 05/09/2022.  Summary: Right: Resting right ankle-brachial index indicates moderate right lower extremity arterial disease. The right toe-brachial index is abnormal. Left: Resting left ankle-brachial index is within normal range. The left toe-brachial index is normal. *See table(s) above for measurements and observations.  Electronically signed by Devon Fogo MD on 06/07/2023 at 10:22:00 AM.    Final        Assessment & Plan:   1. Atherosclerosis of native artery of both lower extremities with intermittent claudication (HCC) (Primary) The patient does have known peripheral arterial  disease, however I do not feel that it is the ultimate source of his ongoing knee pain currently.  I do believe that based on his positioning he has some sort of a sprain or injury to the knee itself.  I had a long discussion with the patient regarding peripheral arterial disease and intervention.  We discussed claudication is often the beginning signs and symptoms and is variable for many patients.  Currently he denies any rest pain or ulceration.  Based on this, intervention is typically based on how significant the symptoms of claudication are for him.  Following this discussion the patient does not wish to move forward with intervention he will continue with conservative therapy we will plan on having him return in 1 year for  noninvasive studies.  2. Hyperlipidemia, unspecified hyperlipidemia type Continue statin as ordered and reviewed, no changes at this time  3. Sprain of right knee, unspecified ligament, initial encounter Based on patient's description of pain and discomfort I suspect that his ongoing pain is related to a sprain within the knee.  He will continue to follow-up with his orthopedic specialist and PCP   Current Outpatient Medications on File Prior to Visit  Medication Sig Dispense Refill   amiodarone  (PACERONE ) 200 MG tablet TAKE 1 TABLET BY MOUTH EVERY DAY 90 tablet 0   celecoxib  (CELEBREX ) 400 MG capsule Take 1 capsule (400 mg total) by mouth daily after breakfast. 30 capsule 3   diazepam  (VALIUM ) 10 MG tablet TAKE 1 TABLET BY MOUTH THREE TIMES A DAY 90 tablet 2   ELIQUIS  5 MG TABS tablet TAKE 1 TABLET BY MOUTH TWICE A DAY 60 tablet 3   fenofibrate  160 MG tablet TAKE 1 TABLET BY MOUTH EVERY DAY 90 tablet 1   Fluticasone-Umeclidin-Vilant (TRELEGY ELLIPTA ) 100-62.5-25 MCG/ACT AEPB Take 1 Inhalation by mouth daily. USE 1 INHALTION BY MOUTH EVERY DAY AS DIRECTED 84 each 1   furosemide  (LASIX ) 20 MG tablet Take 1 tablet (20 mg total) by mouth daily. 90 tablet 1   gabapentin  (NEURONTIN ) 300 MG capsule Take 1 capsule (300 mg total) by mouth See admin instructions. Take 1 in the morning, 1 in evening and 3 at bedtime 150 capsule 2   ipratropium-albuterol  (DUONEB) 0.5-2.5 (3) MG/3ML SOLN Take 3 mLs by nebulization every 6 (six) hours as needed. 90 mL 11   levothyroxine  (SYNTHROID ) 150 MCG tablet TAKE 1 TABLET BY MOUTH EVERY DAY IN THE MORNING 90 tablet 1   losartan (COZAAR) 25 MG tablet TAKE 1 TABLET BY MOUTH EVERY DAY 90 tablet 0   methylPREDNISolone (MEDROL DOSEPAK) 4 MG TBPK tablet Take by mouth.     metoprolol  succinate (TOPROL -XL) 25 MG 24 hr tablet TAKE 1 TABLET BY MOUTH EVERY DAY 90 tablet 1   rosuvastatin  (CRESTOR ) 40 MG tablet TAKE 1 TABLET BY MOUTH EVERY DAY 90 tablet 1   cyclobenzaprine   (FLEXERIL ) 5 MG tablet Take 1 tablet (5 mg total) by mouth 3 (three) times daily as needed. (Patient not taking: Reported on 08/15/2022) 15 tablet 0   predniSONE  (DELTASONE ) 10 MG tablet Take 6 tablets  today, on day 2 take 5 tablets, day 3 take 4 tablets, day 4 take 3 tablets, day 5 take  2 tablets and 1 tablet the last day (Patient not taking: Reported on 05/17/2022) 21 tablet 0   rOPINIRole  (REQUIP ) 0.25 MG tablet Take 0.5 mg by mouth at bedtime. (Patient not taking: Reported on 05/17/2022)     No current facility-administered medications on file prior to  visit.    There are no Patient Instructions on file for this visit. No follow-ups on file.   Myesha Stillion E Iren Whipp, NP

## 2023-06-25 ENCOUNTER — Other Ambulatory Visit: Payer: Self-pay | Admitting: Cardiovascular Disease

## 2023-06-25 DIAGNOSIS — I4891 Unspecified atrial fibrillation: Secondary | ICD-10-CM

## 2023-07-05 DIAGNOSIS — M25561 Pain in right knee: Secondary | ICD-10-CM | POA: Diagnosis not present

## 2023-07-07 ENCOUNTER — Other Ambulatory Visit: Payer: Self-pay | Admitting: Internal Medicine

## 2023-07-07 DIAGNOSIS — G2581 Restless legs syndrome: Secondary | ICD-10-CM

## 2023-07-07 DIAGNOSIS — M84361A Stress fracture, right tibia, initial encounter for fracture: Secondary | ICD-10-CM | POA: Diagnosis not present

## 2023-07-11 ENCOUNTER — Encounter (INDEPENDENT_AMBULATORY_CARE_PROVIDER_SITE_OTHER): Payer: Self-pay

## 2023-07-22 ENCOUNTER — Other Ambulatory Visit: Payer: Self-pay | Admitting: Internal Medicine

## 2023-07-22 ENCOUNTER — Other Ambulatory Visit: Payer: Self-pay | Admitting: Cardiovascular Disease

## 2023-07-22 DIAGNOSIS — M25532 Pain in left wrist: Secondary | ICD-10-CM

## 2023-07-22 DIAGNOSIS — I10 Essential (primary) hypertension: Secondary | ICD-10-CM

## 2023-07-23 ENCOUNTER — Other Ambulatory Visit: Payer: Self-pay | Admitting: Cardiovascular Disease

## 2023-07-23 ENCOUNTER — Other Ambulatory Visit: Payer: Self-pay | Admitting: Internal Medicine

## 2023-07-23 DIAGNOSIS — I480 Paroxysmal atrial fibrillation: Secondary | ICD-10-CM

## 2023-07-23 DIAGNOSIS — E781 Pure hyperglyceridemia: Secondary | ICD-10-CM

## 2023-07-23 DIAGNOSIS — E039 Hypothyroidism, unspecified: Secondary | ICD-10-CM

## 2023-07-24 ENCOUNTER — Other Ambulatory Visit

## 2023-07-24 ENCOUNTER — Other Ambulatory Visit: Payer: Self-pay | Admitting: Internal Medicine

## 2023-07-24 ENCOUNTER — Ambulatory Visit: Payer: 59 | Admitting: Internal Medicine

## 2023-07-24 DIAGNOSIS — E039 Hypothyroidism, unspecified: Secondary | ICD-10-CM | POA: Diagnosis not present

## 2023-07-24 DIAGNOSIS — E782 Mixed hyperlipidemia: Secondary | ICD-10-CM | POA: Diagnosis not present

## 2023-07-25 ENCOUNTER — Ambulatory Visit (INDEPENDENT_AMBULATORY_CARE_PROVIDER_SITE_OTHER): Admitting: Internal Medicine

## 2023-07-25 ENCOUNTER — Encounter: Payer: Self-pay | Admitting: Internal Medicine

## 2023-07-25 ENCOUNTER — Ambulatory Visit: Payer: Self-pay | Admitting: Internal Medicine

## 2023-07-25 VITALS — BP 103/72 | HR 62 | Temp 97.9°F | Ht 72.0 in | Wt 211.0 lb

## 2023-07-25 DIAGNOSIS — E782 Mixed hyperlipidemia: Secondary | ICD-10-CM | POA: Diagnosis not present

## 2023-07-25 DIAGNOSIS — E039 Hypothyroidism, unspecified: Secondary | ICD-10-CM

## 2023-07-25 DIAGNOSIS — E781 Pure hyperglyceridemia: Secondary | ICD-10-CM | POA: Diagnosis not present

## 2023-07-25 DIAGNOSIS — N1832 Chronic kidney disease, stage 3b: Secondary | ICD-10-CM | POA: Insufficient documentation

## 2023-07-25 DIAGNOSIS — R42 Dizziness and giddiness: Secondary | ICD-10-CM | POA: Diagnosis not present

## 2023-07-25 DIAGNOSIS — F411 Generalized anxiety disorder: Secondary | ICD-10-CM

## 2023-07-25 DIAGNOSIS — S8391XD Sprain of unspecified site of right knee, subsequent encounter: Secondary | ICD-10-CM

## 2023-07-25 LAB — COMPREHENSIVE METABOLIC PANEL WITH GFR
ALT: 16 IU/L (ref 0–44)
AST: 25 IU/L (ref 0–40)
Albumin: 4.5 g/dL (ref 3.9–4.9)
Alkaline Phosphatase: 67 IU/L (ref 44–121)
BUN/Creatinine Ratio: 15 (ref 10–24)
BUN: 26 mg/dL (ref 8–27)
Bilirubin Total: 0.3 mg/dL (ref 0.0–1.2)
CO2: 21 mmol/L (ref 20–29)
Calcium: 9 mg/dL (ref 8.6–10.2)
Chloride: 103 mmol/L (ref 96–106)
Creatinine, Ser: 1.68 mg/dL — ABNORMAL HIGH (ref 0.76–1.27)
Globulin, Total: 2.4 g/dL (ref 1.5–4.5)
Glucose: 96 mg/dL (ref 70–99)
Potassium: 4.5 mmol/L (ref 3.5–5.2)
Sodium: 137 mmol/L (ref 134–144)
Total Protein: 6.9 g/dL (ref 6.0–8.5)
eGFR: 43 mL/min/{1.73_m2} — ABNORMAL LOW (ref >59)

## 2023-07-25 LAB — LIPID PANEL
Chol/HDL Ratio: 3 ratio (ref 0.0–5.0)
Cholesterol, Total: 137 mg/dL (ref 100–199)
HDL: 46 mg/dL (ref >39)
LDL Chol Calc (NIH): 69 mg/dL (ref 0–99)
Triglycerides: 125 mg/dL (ref 0–149)
VLDL Cholesterol Cal: 22 mg/dL (ref 5–40)

## 2023-07-25 LAB — TSH: TSH: 3.45 u[IU]/mL (ref 0.450–4.500)

## 2023-07-25 MED ORDER — DIAZEPAM 10 MG PO TABS: 10.0000 mg | ORAL_TABLET | Freq: Three times a day (TID) | ORAL | 2 refills | Status: DC

## 2023-07-25 MED ORDER — FENOFIBRATE 160 MG PO TABS: 160.0000 mg | ORAL_TABLET | Freq: Every day | ORAL | 1 refills | Status: DC

## 2023-07-25 MED ORDER — ROSUVASTATIN CALCIUM 40 MG PO TABS: 40.0000 mg | ORAL_TABLET | Freq: Every day | ORAL | 1 refills | Status: DC

## 2023-07-25 NOTE — Progress Notes (Signed)
 Established Patient Office Visit  Subjective:  Patient ID: Perry Martinez, male    DOB: 18-Dec-1955  Age: 68 y.o. MRN: [id number]  Chief Complaint  Patient presents with   Results    3 month lab/CT results     C/o postural dizziness, here for lab review and medication refills. Labs reviewed and notable for stable CKD, TSH within target and well controlled lipids. BP still low on recheck.     No other concerns at this time.   Past Medical History:  Diagnosis Date   Acute blood loss anemia 04/09/2020   Acute CVA (cerebrovascular accident) (HCC) 08/09/2018   AKI (acute kidney injury) (HCC) 04/09/2020   Atrial fibrillation (HCC)    Depression    Hyperlipidemia    Hypertension    Hypothyroidism 06/28/2016   OSA on CPAP 06/16/2020   Substance abuse (HCC)    IV drugs when younger    Past Surgical History:  Procedure Laterality Date   COLONOSCOPY N/A 04/11/2020   Procedure: COLONOSCOPY;  Surgeon: Toledo, Alphonsus Jeans, MD;  Location: [facility];  Service: Gastroenterology;  Laterality: N/A;   ESOPHAGOGASTRODUODENOSCOPY N/A 04/11/2020   Procedure: ESOPHAGOGASTRODUODENOSCOPY (EGD);  Surgeon: Toledo, Alphonsus Jeans, MD;  Location: [facility];  Service: Gastroenterology;  Laterality: N/A;   FRACTURE SURGERY     right foot   ROTATOR CUFF REPAIR     right shoulder    Social History   Socioeconomic History   Marital status: Single    Spouse name: [name]   Number of children: 2   Years of education: GED/ tech   Highest education level: Not on file  Occupational History   Occupation: build houses    Comment: self  Tobacco Use   Smoking status: Every Day    Current packs/day: 0.50    Average packs/day: 0.5 packs/day for 57.1 years (28.5 ttl pk-yrs)    Types: Cigarettes    Start date: 06/28/1966   Smokeless tobacco: Never   Tobacco comments:    previously smoked 1ppd  Vaping Use   Vaping status: Never Used  Substance and Sexual Activity   Alcohol use: No   Drug use: No    Sexual activity: Not Currently  Other Topics Concern   Not on file  Social History Narrative   Lives with Odette Benjamin and her 41 year old daughter and 33 year old son   Self employed   - build houses   Social Drivers of Corporate investment banker Strain: Not on file  Food Insecurity: No Food Insecurity (07/14/2022)   Hunger Vital Sign    Worried About Running Out of Food in the Last Year: Never true    Ran Out of Food in the Last Year: Never true  Transportation Needs: Not on file  Physical Activity: Not on file  Stress: Not on file  Social Connections: Not on file  Intimate Partner Violence: Not At Risk (07/14/2022)   Humiliation, Afraid, Rape, and Kick questionnaire    Fear of Current or Ex-Partner: No    Emotionally Abused: No    Physically Abused: No    Sexually Abused: No    Family History  Problem Relation Age of Onset   Heart disease Mother        dieed at 43   Arthritis Mother    Diabetes Mother    Heart disease Father 62       bypass   Arthritis Father    Hyperlipidemia Father    Hypertension Father  Rheumatic fever Father     Allergies  Allergen Reactions   Pollen Extract     Other reaction(Citlalic Norlander): Cough    Outpatient Medications Prior to Visit  Medication Sig   amiodarone  (PACERONE ) 200 MG tablet TAKE 1 TABLET BY MOUTH EVERY DAY   cyclobenzaprine  (FLEXERIL ) 5 MG tablet Take 1 tablet (5 mg total) by mouth 3 (three) times daily as needed.   ELIQUIS  5 MG TABS tablet TAKE 1 TABLET BY MOUTH TWICE A DAY   Fluticasone-Umeclidin-Vilant (TRELEGY ELLIPTA ) 100-62.5-25 MCG/ACT AEPB Take 1 Inhalation by mouth daily. USE 1 INHALTION BY MOUTH EVERY DAY AS DIRECTED   furosemide  (LASIX ) 20 MG tablet Take 1 tablet (20 mg total) by mouth daily.   gabapentin  (NEURONTIN ) 300 MG capsule TAKE 1 CAPSULE BY MOUTH IN THE MORNING AND EVENING AND 3 CAPSULES AT BEDTIME   ipratropium-albuterol  (DUONEB) 0.5-2.5 (3) MG/3ML SOLN Take 3 mLs by nebulization every 6 (six) hours as needed.    levothyroxine  (SYNTHROID ) 150 MCG tablet TAKE 1 TABLET BY MOUTH EVERY DAY IN THE MORNING   losartan (COZAAR) 25 MG tablet TAKE 1 TABLET BY MOUTH EVERY DAY   metoprolol  succinate (TOPROL -XL) 25 MG 24 hr tablet TAKE 1 TABLET BY MOUTH EVERY DAY   [DISCONTINUED] diazepam  (VALIUM ) 10 MG tablet TAKE 1 TABLET BY MOUTH THREE TIMES A DAY   [DISCONTINUED] fenofibrate  160 MG tablet TAKE 1 TABLET BY MOUTH EVERY DAY   [DISCONTINUED] rosuvastatin  (CRESTOR ) 40 MG tablet TAKE 1 TABLET BY MOUTH EVERY DAY   celecoxib  (CELEBREX ) 400 MG capsule TAKE 1 CAPSULE (400 MG TOTAL) BY MOUTH DAILY AFTER BREAKFAST. (Patient not taking: Reported on 07/25/2023)   methylPREDNISolone (MEDROL DOSEPAK) 4 MG TBPK tablet Take by mouth. (Patient not taking: Reported on 07/25/2023)   predniSONE  (DELTASONE ) 10 MG tablet Take 6 tablets  today, on day 2 take 5 tablets, day 3 take 4 tablets, day 4 take 3 tablets, day 5 take  2 tablets and 1 tablet the last day (Patient not taking: Reported on 07/25/2023)   rOPINIRole  (REQUIP ) 0.25 MG tablet Take 0.5 mg by mouth at bedtime. (Patient not taking: Reported on 05/17/2022)   No facility-administered medications prior to visit.    Review of Systems  Constitutional: Negative.  Negative for weight loss (gained 3 lbs).  HENT: Negative.    Eyes: Negative.   Respiratory: Negative.    Cardiovascular: Negative.   Gastrointestinal: Negative.   Genitourinary: Negative.   Musculoskeletal:  Positive for joint pain.  Skin: Negative.   Neurological:  Positive for dizziness.  Endo/Heme/Allergies: Negative.        Objective:   BP 103/72   Pulse 62   Temp 97.9 F (36.6 C)   Ht 6' (1.829 m)   Wt 211 lb (95.7 kg)   SpO2 95%   BMI 28.62 kg/m   Vitals:   07/25/23 1005  BP: 103/72  Pulse: 62  Temp: 97.9 F (36.6 C)  Height: 6' (1.829 m)  Weight: 211 lb (95.7 kg)  SpO2: 95%  BMI (Calculated): 28.61    Physical Exam Vitals reviewed.  Constitutional:      Appearance: Normal appearance.   HENT:     Head: Normocephalic.     Left Ear: There is no impacted cerumen.     Nose: Nose normal.     Mouth/Throat:     Mouth: Mucous membranes are moist.     Pharynx: No posterior oropharyngeal erythema.  Eyes:     Extraocular Movements: Extraocular movements intact.  Pupils: Pupils are equal, round, and reactive to light.  Cardiovascular:     Rate and Rhythm: Regular rhythm.     Chest Wall: PMI is not displaced.     Pulses: Normal pulses.     Heart sounds: Normal heart sounds. No murmur heard. Pulmonary:     Effort: Pulmonary effort is normal.     Breath sounds: Normal air entry. No rhonchi or rales.  Abdominal:     General: Abdomen is flat. Bowel sounds are normal. There is no distension.     Palpations: Abdomen is soft. There is no hepatomegaly, splenomegaly or mass.     Tenderness: There is no abdominal tenderness.  Musculoskeletal:        General: Swelling present. Normal range of motion.     Left wrist: Swelling and tenderness present. No effusion or lacerations.     Cervical back: Normal range of motion and neck supple.     Right lower leg: No edema.     Left lower leg: No edema.     Comments: Wrist swelling has improved  Skin:    General: Skin is warm and dry.  Neurological:     General: No focal deficit present.     Mental Status: He is alert and oriented to person, place, and time.     Cranial Nerves: No cranial nerve deficit.     Motor: No weakness.  Psychiatric:        Mood and Affect: Mood normal.        Behavior: Behavior normal.      No results found for any visits on 07/25/23.  Recent Results (from the past 2160 hours)  VAS US  ABI WITH/WO TBI     Status: None   Collection Time: 06/02/23  9:52 AM  Result Value Ref Range   Right ABI .72    Left ABI .96   Comprehensive metabolic panel with GFR     Status: Abnormal   Collection Time: 07/24/23  9:27 AM  Result Value Ref Range   Glucose 96 70 - 99 mg/dL   BUN 26 8 - 27 mg/dL   Creatinine, Ser  5.78 (H) 0.76 - 1.27 mg/dL   eGFR 43 (L) >46 NG/EXB/2.84   BUN/Creatinine Ratio 15 10 - 24   Sodium 137 134 - 144 mmol/L   Potassium 4.5 3.5 - 5.2 mmol/L   Chloride 103 96 - 106 mmol/L   CO2 21 20 - 29 mmol/L   Calcium  9.0 8.6 - 10.2 mg/dL   Total Protein 6.9 6.0 - 8.5 g/dL   Albumin 4.5 3.9 - 4.9 g/dL   Globulin, Total 2.4 1.5 - 4.5 g/dL   Bilirubin Total 0.3 0.0 - 1.2 mg/dL   Alkaline Phosphatase 67 44 - 121 IU/L   AST 25 0 - 40 IU/L   ALT 16 0 - 44 IU/L  Lipid panel     Status: None   Collection Time: 07/24/23  9:27 AM  Result Value Ref Range   Cholesterol, Total 137 100 - 199 mg/dL   Triglycerides 132 0 - 149 mg/dL   HDL 46 >44 mg/dL   VLDL Cholesterol Cal 22 5 - 40 mg/dL   LDL Chol Calc (NIH) 69 0 - 99 mg/dL   Chol/HDL Ratio 3.0 0.0 - 5.0 ratio    Comment:  T. Chol/HDL Ratio                                             Men  Women                               1/2 Avg.Risk  3.4    3.3                                   Avg.Risk  5.0    4.4                                2X Avg.Risk  9.6    7.1                                3X Avg.Risk 23.4   11.0   TSH     Status: None   Collection Time: 07/24/23  9:27 AM  Result Value Ref Range   TSH 3.450 0.450 - 4.500 uIU/mL      Assessment & Plan:  As per problem list. Stop losartan. Scheduled for intra-articular injection on 6/27.  Problem List Items Addressed This Visit       Endocrine   Hypothyroidism     Musculoskeletal and Integument   Sprain of right knee     Genitourinary   CKD stage 3b, GFR 30-44 ml/min (HCC) - Primary     Other   Hyperlipidemia   Relevant Medications   fenofibrate  160 MG tablet   rosuvastatin  (CRESTOR ) 40 MG tablet   GAD (generalized anxiety disorder)   Relevant Medications   diazepam  (VALIUM ) 10 MG tablet   Orthostatic dizziness   Other Visit Diagnoses       Generalized anxiety disorder       Relevant Medications   diazepam  (VALIUM ) 10 MG tablet      Pure hyperglyceridemia       Relevant Medications   fenofibrate  160 MG tablet   rosuvastatin  (CRESTOR ) 40 MG tablet       Return in about 2 weeks (around 08/08/2023) for BP followup.   Total time spent: 30 minutes  Arzella Bitters, MD  07/25/2023   This document may have been prepared by [facility] Voice Recognition software and as such may include unintentional dictation errors.

## 2023-08-01 DIAGNOSIS — M25461 Effusion, right knee: Secondary | ICD-10-CM | POA: Diagnosis not present

## 2023-08-01 DIAGNOSIS — M25561 Pain in right knee: Secondary | ICD-10-CM | POA: Diagnosis not present

## 2023-08-07 ENCOUNTER — Other Ambulatory Visit: Payer: Self-pay | Admitting: Internal Medicine

## 2023-08-07 DIAGNOSIS — F411 Generalized anxiety disorder: Secondary | ICD-10-CM

## 2023-08-08 ENCOUNTER — Ambulatory Visit (INDEPENDENT_AMBULATORY_CARE_PROVIDER_SITE_OTHER): Admitting: Internal Medicine

## 2023-08-08 ENCOUNTER — Encounter: Payer: Self-pay | Admitting: Internal Medicine

## 2023-08-08 ENCOUNTER — Other Ambulatory Visit: Payer: Self-pay | Admitting: Internal Medicine

## 2023-08-08 VITALS — BP 120/80 | HR 59 | Temp 97.5°F | Ht 72.0 in | Wt 209.8 lb

## 2023-08-08 DIAGNOSIS — R42 Dizziness and giddiness: Secondary | ICD-10-CM

## 2023-08-08 DIAGNOSIS — F411 Generalized anxiety disorder: Secondary | ICD-10-CM

## 2023-08-08 DIAGNOSIS — Z0130 Encounter for examination of blood pressure without abnormal findings: Secondary | ICD-10-CM

## 2023-08-08 DIAGNOSIS — N1832 Chronic kidney disease, stage 3b: Secondary | ICD-10-CM

## 2023-08-08 NOTE — Progress Notes (Signed)
 Established Patient Office Visit  Subjective:  Patient ID: Perry Martinez, male    DOB: August 24, 1955  Age: 68 y.o. MRN: [id number]  Chief Complaint  Patient presents with   Follow-up    2 week BP follow up    No new complaints bp has improved and less lethargic now.    No other concerns at this time.   Past Medical History:  Diagnosis Date   Acute blood loss anemia 04/09/2020   Acute CVA (cerebrovascular accident) (HCC) 08/09/2018   AKI (acute kidney injury) (HCC) 04/09/2020   Atrial fibrillation (HCC)    Depression    Hyperlipidemia    Hypertension    Hypothyroidism 06/28/2016   OSA on CPAP 06/16/2020   Substance abuse (HCC)    IV drugs when younger    Past Surgical History:  Procedure Laterality Date   COLONOSCOPY N/A 04/11/2020   Procedure: COLONOSCOPY;  Surgeon: Toledo, Alphonsus Jeans, MD;  Location: [facility];  Service: Gastroenterology;  Laterality: N/A;   ESOPHAGOGASTRODUODENOSCOPY N/A 04/11/2020   Procedure: ESOPHAGOGASTRODUODENOSCOPY (EGD);  Surgeon: Toledo, Alphonsus Jeans, MD;  Location: [facility];  Service: Gastroenterology;  Laterality: N/A;   FRACTURE SURGERY     right foot   ROTATOR CUFF REPAIR     right shoulder    Social History   Socioeconomic History   Marital status: Single    Spouse name: [name]   Number of children: 2   Years of education: GED/ tech   Highest education level: Not on file  Occupational History   Occupation: build houses    Comment: self  Tobacco Use   Smoking status: Every Day    Current packs/day: 0.50    Average packs/day: 0.5 packs/day for 57.1 years (28.6 ttl pk-yrs)    Types: Cigarettes    Start date: 06/28/1966   Smokeless tobacco: Never   Tobacco comments:    previously smoked 1ppd  Vaping Use   Vaping status: Never Used  Substance and Sexual Activity   Alcohol use: No   Drug use: No   Sexual activity: Not Currently  Other Topics Concern   Not on file  Social History Narrative   Lives with Odette Benjamin and her 51 year  old daughter and 15 year old son   Self employed   - build houses   Social Drivers of Corporate investment banker Strain: Not on file  Food Insecurity: No Food Insecurity (07/14/2022)   Hunger Vital Sign    Worried About Running Out of Food in the Last Year: Never true    Ran Out of Food in the Last Year: Never true  Transportation Needs: Not on file  Physical Activity: Not on file  Stress: Not on file  Social Connections: Not on file  Intimate Partner Violence: Not At Risk (07/14/2022)   Humiliation, Afraid, Rape, and Kick questionnaire    Fear of Current or Ex-Partner: No    Emotionally Abused: No    Physically Abused: No    Sexually Abused: No    Family History  Problem Relation Age of Onset   Heart disease Mother        dieed at 42   Arthritis Mother    Diabetes Mother    Heart disease Father 71       bypass   Arthritis Father    Hyperlipidemia Father    Hypertension Father    Rheumatic fever Father     Allergies  Allergen Reactions   Pollen Extract  Other reaction(Steele Stracener): Cough    Outpatient Medications Prior to Visit  Medication Sig Note   amiodarone  (PACERONE ) 200 MG tablet TAKE 1 TABLET BY MOUTH EVERY DAY    celecoxib  (CELEBREX ) 400 MG capsule TAKE 1 CAPSULE (400 MG TOTAL) BY MOUTH DAILY AFTER BREAKFAST.    cyclobenzaprine  (FLEXERIL ) 5 MG tablet Take 1 tablet (5 mg total) by mouth 3 (three) times daily as needed.    diazepam  (VALIUM ) 10 MG tablet Take 1 tablet (10 mg total) by mouth 3 (three) times daily.    ELIQUIS  5 MG TABS tablet TAKE 1 TABLET BY MOUTH TWICE A DAY    fenofibrate  160 MG tablet Take 1 tablet (160 mg total) by mouth daily.    Fluticasone-Umeclidin-Vilant (TRELEGY ELLIPTA ) 100-62.5-25 MCG/ACT AEPB Take 1 Inhalation by mouth daily. USE 1 INHALTION BY MOUTH EVERY DAY AS DIRECTED    furosemide  (LASIX ) 20 MG tablet Take 1 tablet (20 mg total) by mouth daily.    gabapentin  (NEURONTIN ) 300 MG capsule TAKE 1 CAPSULE BY MOUTH IN THE MORNING AND  EVENING AND 3 CAPSULES AT BEDTIME    ipratropium-albuterol  (DUONEB) 0.5-2.5 (3) MG/3ML SOLN Take 3 mLs by nebulization every 6 (six) hours as needed.    levothyroxine  (SYNTHROID ) 150 MCG tablet TAKE 1 TABLET BY MOUTH EVERY DAY IN THE MORNING    metoprolol  succinate (TOPROL -XL) 25 MG 24 hr tablet TAKE 1 TABLET BY MOUTH EVERY DAY    predniSONE  (DELTASONE ) 10 MG tablet Take 6 tablets  today, on day 2 take 5 tablets, day 3 take 4 tablets, day 4 take 3 tablets, day 5 take  2 tablets and 1 tablet the last day    rOPINIRole  (REQUIP ) 0.25 MG tablet Take 0.5 mg by mouth at bedtime.    rosuvastatin  (CRESTOR ) 40 MG tablet Take 1 tablet (40 mg total) by mouth daily.    [DISCONTINUED] losartan (COZAAR) 25 MG tablet TAKE 1 TABLET BY MOUTH EVERY DAY 08/08/2023: hypotension   [DISCONTINUED] methylPREDNISolone (MEDROL DOSEPAK) 4 MG TBPK tablet Take by mouth. (Patient not taking: Reported on 08/08/2023)    No facility-administered medications prior to visit.    Review of Systems  Constitutional:  Positive for weight loss (2 lbs).  HENT: Negative.    Eyes: Negative.   Respiratory: Negative.    Cardiovascular: Negative.   Gastrointestinal: Negative.   Genitourinary: Negative.   Musculoskeletal:  Positive for joint pain.  Skin: Negative.   Neurological:  Positive for dizziness.  Endo/Heme/Allergies: Negative.        Objective:   BP 120/80   Pulse (!) 59   Temp (!) 97.5 F (36.4 C)   Ht 6' (1.829 m)   Wt 209 lb 12.8 oz (95.2 kg)   SpO2 94%   BMI 28.45 kg/m   Vitals:   08/08/23 1144  BP: 120/80  Pulse: (!) 59  Temp: (!) 97.5 F (36.4 C)  Height: 6' (1.829 m)  Weight: 209 lb 12.8 oz (95.2 kg)  SpO2: 94%  BMI (Calculated): 28.45    Physical Exam Vitals reviewed.  Constitutional:      Appearance: Normal appearance.  HENT:     Head: Normocephalic.     Left Ear: There is no impacted cerumen.     Nose: Nose normal.     Mouth/Throat:     Mouth: Mucous membranes are moist.     Pharynx:  No posterior oropharyngeal erythema.   Eyes:     Extraocular Movements: Extraocular movements intact.     Pupils: Pupils are equal,  round, and reactive to light.    Cardiovascular:     Rate and Rhythm: Regular rhythm.     Chest Wall: PMI is not displaced.     Pulses: Normal pulses.     Heart sounds: Normal heart sounds. No murmur heard. Pulmonary:     Effort: Pulmonary effort is normal.     Breath sounds: Normal air entry. No rhonchi or rales.  Abdominal:     General: Abdomen is flat. Bowel sounds are normal. There is no distension.     Palpations: Abdomen is soft. There is no hepatomegaly, splenomegaly or mass.     Tenderness: There is no abdominal tenderness.   Musculoskeletal:        General: Swelling present. Normal range of motion.     Left wrist: Swelling and tenderness present. No effusion or lacerations.     Cervical back: Normal range of motion and neck supple.     Right lower leg: No edema.     Left lower leg: No edema.     Comments: Wrist swelling has improved   Skin:    General: Skin is warm and dry.   Neurological:     General: No focal deficit present.     Mental Status: He is alert and oriented to person, place, and time.     Cranial Nerves: No cranial nerve deficit.     Motor: No weakness.   Psychiatric:        Mood and Affect: Mood normal.        Behavior: Behavior normal.     No results found for any visits on 08/08/23.  Recent Results (from the past 2160 hours)  VAS US  ABI WITH/WO TBI     Status: None   Collection Time: 06/02/23  9:52 AM  Result Value Ref Range   Right ABI .72    Left ABI .96   Comprehensive metabolic panel with GFR     Status: Abnormal   Collection Time: 07/24/23  9:27 AM  Result Value Ref Range   Glucose 96 70 - 99 mg/dL   BUN 26 8 - 27 mg/dL   Creatinine, Ser 7.25 (H) 0.76 - 1.27 mg/dL   eGFR 43 (L) >36 UY/QIH/4.74   BUN/Creatinine Ratio 15 10 - 24   Sodium 137 134 - 144 mmol/L   Potassium 4.5 3.5 - 5.2 mmol/L    Chloride 103 96 - 106 mmol/L   CO2 21 20 - 29 mmol/L   Calcium  9.0 8.6 - 10.2 mg/dL   Total Protein 6.9 6.0 - 8.5 g/dL   Albumin 4.5 3.9 - 4.9 g/dL   Globulin, Total 2.4 1.5 - 4.5 g/dL   Bilirubin Total 0.3 0.0 - 1.2 mg/dL   Alkaline Phosphatase 67 44 - 121 IU/L   AST 25 0 - 40 IU/L   ALT 16 0 - 44 IU/L  Lipid panel     Status: None   Collection Time: 07/24/23  9:27 AM  Result Value Ref Range   Cholesterol, Total 137 100 - 199 mg/dL   Triglycerides 259 0 - 149 mg/dL   HDL 46 >56 mg/dL   VLDL Cholesterol Cal 22 5 - 40 mg/dL   LDL Chol Calc (NIH) 69 0 - 99 mg/dL   Chol/HDL Ratio 3.0 0.0 - 5.0 ratio    Comment:  T. Chol/HDL Ratio                                             Men  Women                               1/2 Avg.Risk  3.4    3.3                                   Avg.Risk  5.0    4.4                                2X Avg.Risk  9.6    7.1                                3X Avg.Risk 23.4   11.0   TSH     Status: None   Collection Time: 07/24/23  9:27 AM  Result Value Ref Range   TSH 3.450 0.450 - 4.500 uIU/mL      Assessment & Plan:  As per problem list. Discontinue losartan and inform Cardiology. Problem List Items Addressed This Visit       Genitourinary   CKD stage 3b, GFR 30-44 ml/min (HCC)     Other   Orthostatic dizziness - Primary    Return in about 12 weeks (around 10/31/2023) for fu with labs prior.   Total time spent: 20 minutes  Arzella Bitters, MD  08/08/2023   This document may have been prepared by [facility] Voice Recognition software and as such may include unintentional dictation errors.

## 2023-08-18 DIAGNOSIS — M1711 Unilateral primary osteoarthritis, right knee: Secondary | ICD-10-CM | POA: Diagnosis not present

## 2023-08-18 DIAGNOSIS — Z7901 Long term (current) use of anticoagulants: Secondary | ICD-10-CM | POA: Diagnosis not present

## 2023-08-19 ENCOUNTER — Other Ambulatory Visit: Payer: Self-pay | Admitting: Internal Medicine

## 2023-09-08 ENCOUNTER — Other Ambulatory Visit: Payer: Self-pay | Admitting: Internal Medicine

## 2023-09-08 DIAGNOSIS — J42 Unspecified chronic bronchitis: Secondary | ICD-10-CM

## 2023-09-25 DIAGNOSIS — J449 Chronic obstructive pulmonary disease, unspecified: Secondary | ICD-10-CM | POA: Diagnosis not present

## 2023-10-08 ENCOUNTER — Other Ambulatory Visit: Payer: Self-pay | Admitting: Cardiovascular Disease

## 2023-10-08 DIAGNOSIS — I4891 Unspecified atrial fibrillation: Secondary | ICD-10-CM

## 2023-10-17 ENCOUNTER — Other Ambulatory Visit: Payer: Self-pay | Admitting: Internal Medicine

## 2023-10-17 ENCOUNTER — Other Ambulatory Visit: Payer: Self-pay | Admitting: Cardiovascular Disease

## 2023-10-17 DIAGNOSIS — N189 Chronic kidney disease, unspecified: Secondary | ICD-10-CM

## 2023-10-17 DIAGNOSIS — I480 Paroxysmal atrial fibrillation: Secondary | ICD-10-CM

## 2023-10-19 ENCOUNTER — Other Ambulatory Visit: Payer: Self-pay | Admitting: Cardiovascular Disease

## 2023-10-19 DIAGNOSIS — I10 Essential (primary) hypertension: Secondary | ICD-10-CM

## 2023-10-30 ENCOUNTER — Other Ambulatory Visit

## 2023-10-30 DIAGNOSIS — E782 Mixed hyperlipidemia: Secondary | ICD-10-CM | POA: Diagnosis not present

## 2023-10-31 LAB — COMPREHENSIVE METABOLIC PANEL WITH GFR
ALT: 14 IU/L (ref 0–44)
AST: 20 IU/L (ref 0–40)
Albumin: 4.5 g/dL (ref 3.9–4.9)
Alkaline Phosphatase: 59 IU/L (ref 44–121)
BUN/Creatinine Ratio: 17 (ref 10–24)
BUN: 28 mg/dL — ABNORMAL HIGH (ref 8–27)
Bilirubin Total: 0.3 mg/dL (ref 0.0–1.2)
CO2: 22 mmol/L (ref 20–29)
Calcium: 9.1 mg/dL (ref 8.6–10.2)
Chloride: 105 mmol/L (ref 96–106)
Creatinine, Ser: 1.61 mg/dL — ABNORMAL HIGH (ref 0.76–1.27)
Globulin, Total: 2.2 g/dL (ref 1.5–4.5)
Glucose: 112 mg/dL — ABNORMAL HIGH (ref 70–99)
Potassium: 4.6 mmol/L (ref 3.5–5.2)
Sodium: 142 mmol/L (ref 134–144)
Total Protein: 6.7 g/dL (ref 6.0–8.5)
eGFR: 46 mL/min/1.73 — ABNORMAL LOW (ref >59)

## 2023-10-31 LAB — LIPID PANEL
Chol/HDL Ratio: 3.5 ratio (ref 0.0–5.0)
Cholesterol, Total: 146 mg/dL (ref 100–199)
HDL: 42 mg/dL (ref >39)
LDL Chol Calc (NIH): 76 mg/dL (ref 0–99)
Triglycerides: 163 mg/dL — ABNORMAL HIGH (ref 0–149)
VLDL Cholesterol Cal: 28 mg/dL (ref 5–40)

## 2023-10-31 LAB — TSH: TSH: 0.961 u[IU]/mL (ref 0.450–4.500)

## 2023-11-01 ENCOUNTER — Encounter: Payer: Self-pay | Admitting: Internal Medicine

## 2023-11-01 ENCOUNTER — Ambulatory Visit (INDEPENDENT_AMBULATORY_CARE_PROVIDER_SITE_OTHER): Admitting: Internal Medicine

## 2023-11-01 ENCOUNTER — Ambulatory Visit: Payer: Self-pay | Admitting: Internal Medicine

## 2023-11-01 VITALS — BP 122/84 | HR 61 | Temp 97.9°F | Ht 70.0 in | Wt 209.8 lb

## 2023-11-01 DIAGNOSIS — N400 Enlarged prostate without lower urinary tract symptoms: Secondary | ICD-10-CM

## 2023-11-01 DIAGNOSIS — N1832 Chronic kidney disease, stage 3b: Secondary | ICD-10-CM | POA: Diagnosis not present

## 2023-11-01 DIAGNOSIS — J410 Simple chronic bronchitis: Secondary | ICD-10-CM

## 2023-11-01 DIAGNOSIS — E039 Hypothyroidism, unspecified: Secondary | ICD-10-CM

## 2023-11-01 DIAGNOSIS — E782 Mixed hyperlipidemia: Secondary | ICD-10-CM

## 2023-11-01 DIAGNOSIS — I10 Essential (primary) hypertension: Secondary | ICD-10-CM | POA: Insufficient documentation

## 2023-11-01 DIAGNOSIS — G2581 Restless legs syndrome: Secondary | ICD-10-CM | POA: Diagnosis not present

## 2023-11-01 DIAGNOSIS — F411 Generalized anxiety disorder: Secondary | ICD-10-CM

## 2023-11-01 MED ORDER — FUROSEMIDE 20 MG PO TABS: 20.0000 mg | ORAL_TABLET | Freq: Every day | ORAL | 1 refills | Status: DC | PRN

## 2023-11-01 MED ORDER — IPRATROPIUM-ALBUTEROL 0.5-2.5 (3) MG/3ML IN SOLN: 3.0000 mL | Freq: Four times a day (QID) | RESPIRATORY_TRACT | 11 refills | Status: AC | PRN

## 2023-11-01 MED ORDER — DIAZEPAM 10 MG PO TABS: 10.0000 mg | ORAL_TABLET | Freq: Three times a day (TID) | ORAL | 2 refills | Status: DC

## 2023-11-01 MED ORDER — GABAPENTIN 300 MG PO CAPS: ORAL_CAPSULE | ORAL | 2 refills | Status: DC

## 2023-11-01 NOTE — Progress Notes (Signed)
 Established Patient Office Visit  Subjective:  Patient ID: Perry Martinez, male    DOB: [birth year]  Age: 68 y.o. MRN: [id number]  Chief Complaint  Patient presents with   Follow-up    12 week follow up     No new complaints, here for lab review and medication refills. Labs reviewed and notable for stable ckd 3b, lipids well controlled with normal TSH. Awaits right knee replacement in October, stopped Celebrex  as is ineffective.    No other concerns at this time.   Past Medical History:  Diagnosis Date   Acute blood loss anemia 04/09/2020   Acute CVA (cerebrovascular accident) (HCC) 08/09/2018   AKI (acute kidney injury) (HCC) 04/09/2020   Atrial fibrillation (HCC)    Depression    Hyperlipidemia    Hypertension    Hypothyroidism 06/28/2016   OSA on CPAP 06/16/2020   Substance abuse (HCC)    IV drugs when younger    Past Surgical History:  Procedure Laterality Date   COLONOSCOPY N/A 04/11/2020   Procedure: COLONOSCOPY;  Surgeon: Toledo, Ladell POUR, MD;  Location: [facility];  Service: Gastroenterology;  Laterality: N/A;   ESOPHAGOGASTRODUODENOSCOPY N/A 04/11/2020   Procedure: ESOPHAGOGASTRODUODENOSCOPY (EGD);  Surgeon: Toledo, Ladell POUR, MD;  Location: [facility];  Service: Gastroenterology;  Laterality: N/A;   FRACTURE SURGERY     right foot   ROTATOR CUFF REPAIR     right shoulder    Social History   Socioeconomic History   Marital status: Single    Spouse name: [name]   Number of children: 2   Years of education: GED/ tech   Highest education level: Not on file  Occupational History   Occupation: build houses    Comment: self  Tobacco Use   Smoking status: Former    Current packs/day: 0.50    Average packs/day: 0.5 packs/day for 57.3 years (28.7 ttl pk-yrs)    Types: Cigarettes    Start date: 06/28/1966   Smokeless tobacco: Never   Tobacco comments:    previously smoked 1ppd  Vaping Use   Vaping status: Never Used  Substance and Sexual Activity    Alcohol use: No   Drug use: No   Sexual activity: Not Currently  Other Topics Concern   Not on file  Social History Narrative   Lives with Angeline and her 24 year old daughter and 19 year old son   Self employed   - build houses   Social Drivers of Corporate investment banker Strain: Not on file  Food Insecurity: No Food Insecurity (07/14/2022)   Hunger Vital Sign    Worried About Running Out of Food in the Last Year: Never true    Ran Out of Food in the Last Year: Never true  Transportation Needs: Not on file  Physical Activity: Not on file  Stress: Not on file  Social Connections: Not on file  Intimate Partner Violence: Not At Risk (07/14/2022)   Humiliation, Afraid, Rape, and Kick questionnaire    Fear of Current or Ex-Partner: No    Emotionally Abused: No    Physically Abused: No    Sexually Abused: No    Family History  Problem Relation Age of Onset   Heart disease Mother        dieed at 40   Arthritis Mother    Diabetes Mother    Heart disease Father 30       bypass   Arthritis Father    Hyperlipidemia Father  Hypertension Father    Rheumatic fever Father     Allergies  Allergen Reactions   Pollen Extract     Other reaction(Moua Rasmusson): Cough    Outpatient Medications Prior to Visit  Medication Sig   amiodarone  (PACERONE ) 200 MG tablet TAKE 1 TABLET BY MOUTH EVERY DAY   cyclobenzaprine  (FLEXERIL ) 5 MG tablet Take 1 tablet (5 mg total) by mouth 3 (three) times daily as needed.   ELIQUIS  5 MG TABS tablet TAKE 1 TABLET BY MOUTH TWICE A DAY   fenofibrate  160 MG tablet Take 1 tablet (160 mg total) by mouth daily.   levothyroxine  (SYNTHROID ) 150 MCG tablet TAKE 1 TABLET BY MOUTH EVERY DAY IN THE MORNING   metoprolol  succinate (TOPROL -XL) 25 MG 24 hr tablet TAKE 1 TABLET BY MOUTH EVERY DAY   rosuvastatin  (CRESTOR ) 40 MG tablet Take 1 tablet (40 mg total) by mouth daily.   TRELEGY ELLIPTA  100-62.5-25 MCG/ACT AEPB USE 1 INHALTION BY MOUTH EVERY DAY AS DIRECTED    [DISCONTINUED] diazepam  (VALIUM ) 10 MG tablet Take 1 tablet (10 mg total) by mouth 3 (three) times daily.   [DISCONTINUED] furosemide  (LASIX ) 20 MG tablet TAKE 1 TABLET BY MOUTH EVERY DAY   [DISCONTINUED] gabapentin  (NEURONTIN ) 300 MG capsule TAKE 1 CAPSULE BY MOUTH IN THE MORNING AND EVENING AND 3 CAPSULES AT BEDTIME   [DISCONTINUED] ipratropium-albuterol  (DUONEB) 0.5-2.5 (3) MG/3ML SOLN Take 3 mLs by nebulization every 6 (six) hours as needed.   rOPINIRole  (REQUIP ) 0.25 MG tablet Take 0.5 mg by mouth at bedtime. (Patient not taking: Reported on 11/01/2023)   [DISCONTINUED] celecoxib  (CELEBREX ) 400 MG capsule TAKE 1 CAPSULE (400 MG TOTAL) BY MOUTH DAILY AFTER BREAKFAST. (Patient not taking: Reported on 11/01/2023)   [DISCONTINUED] predniSONE  (DELTASONE ) 10 MG tablet Take 6 tablets  today, on day 2 take 5 tablets, day 3 take 4 tablets, day 4 take 3 tablets, day 5 take  2 tablets and 1 tablet the last day (Patient not taking: Reported on 11/01/2023)   No facility-administered medications prior to visit.    Review of Systems  Constitutional:  Negative for weight loss.  HENT: Negative.    Eyes: Negative.   Respiratory: Negative.    Cardiovascular: Negative.   Gastrointestinal: Negative.   Genitourinary: Negative.   Musculoskeletal:  Positive for joint pain (right knee).  Skin: Negative.   Neurological:  Positive for dizziness.  Endo/Heme/Allergies: Negative.        Objective:   BP 122/84   Pulse 61   Temp 97.9 F (36.6 C)   Ht 5' 10 (1.778 m)   Wt 209 lb 12.8 oz (95.2 kg)   SpO2 96%   BMI 30.10 kg/m   Vitals:   11/01/23 1132  BP: 122/84  Pulse: 61  Temp: 97.9 F (36.6 C)  Height: 5' 10 (1.778 m)  Weight: 209 lb 12.8 oz (95.2 kg)  SpO2: 96%  BMI (Calculated): 30.1    Physical Exam Vitals reviewed.  Constitutional:      Appearance: Normal appearance.  HENT:     Head: Normocephalic.     Left Ear: There is no impacted cerumen.     Nose: Nose normal.      Mouth/Throat:     Mouth: Mucous membranes are moist.     Pharynx: No posterior oropharyngeal erythema.  Eyes:     Extraocular Movements: Extraocular movements intact.     Pupils: Pupils are equal, round, and reactive to light.  Cardiovascular:     Rate and Rhythm: Regular rhythm.  Chest Wall: PMI is not displaced.     Pulses: Normal pulses.     Heart sounds: Normal heart sounds. No murmur heard. Pulmonary:     Effort: Pulmonary effort is normal.     Breath sounds: Normal air entry. No rhonchi or rales.  Abdominal:     General: Abdomen is flat. Bowel sounds are normal. There is no distension.     Palpations: Abdomen is soft. There is no hepatomegaly, splenomegaly or mass.     Tenderness: There is no abdominal tenderness.  Musculoskeletal:        General: Swelling present. Normal range of motion.     Left wrist: Swelling and tenderness present. No effusion or lacerations.     Cervical back: Normal range of motion and neck supple.     Right lower leg: No edema.     Left lower leg: No edema.     Comments: Wrist swelling has improved  Skin:    General: Skin is warm and dry.  Neurological:     General: No focal deficit present.     Mental Status: He is alert and oriented to person, place, and time.     Cranial Nerves: No cranial nerve deficit.     Motor: No weakness.  Psychiatric:        Mood and Affect: Mood normal.        Behavior: Behavior normal.      No results found for any visits on 11/01/23.  Recent Results (from the past 2160 hours)  Lipid panel     Status: Abnormal   Collection Time: 10/30/23 10:01 AM  Result Value Ref Range   Cholesterol, Total 146 100 - 199 mg/dL   Triglycerides 836 (H) 0 - 149 mg/dL   HDL 42 >60 mg/dL   VLDL Cholesterol Cal 28 5 - 40 mg/dL   LDL Chol Calc (NIH) 76 0 - 99 mg/dL   Chol/HDL Ratio 3.5 0.0 - 5.0 ratio    Comment:                                   T. Chol/HDL Ratio                                             Men  Women                                1/2 Avg.Risk  3.4    3.3                                   Avg.Risk  5.0    4.4                                2X Avg.Risk  9.6    7.1                                3X Avg.Risk 23.4   11.0   Comprehensive metabolic panel     Status: Abnormal   Collection Time: 10/30/23 10:01 AM  Result  Value Ref Range   Glucose 112 (H) 70 - 99 mg/dL   BUN 28 (H) 8 - 27 mg/dL   Creatinine, Ser 8.38 (H) 0.76 - 1.27 mg/dL   eGFR 46 (L) >40 fO/fpw/8.26   BUN/Creatinine Ratio 17 10 - 24   Sodium 142 134 - 144 mmol/L   Potassium 4.6 3.5 - 5.2 mmol/L   Chloride 105 96 - 106 mmol/L   CO2 22 20 - 29 mmol/L   Calcium  9.1 8.6 - 10.2 mg/dL   Total Protein 6.7 6.0 - 8.5 g/dL   Albumin 4.5 3.9 - 4.9 g/dL   Globulin, Total 2.2 1.5 - 4.5 g/dL   Bilirubin Total 0.3 0.0 - 1.2 mg/dL   Alkaline Phosphatase 59 44 - 121 IU/L    Comment: **Effective November 06, 2023 Alkaline Phosphatase**   reference interval will be changing to:              Age                Male          Male           0 -  5 days         47 - 127       47 - 127           6 - 10 days         29 - 242       29 - 242          11 - 20 days        109 - 357      109 - 357          21 - 30 days         94 - 494       94 - 494           1 -  2 months      149 - 539      149 - 539           3 -  6 months      131 - 452      131 - 452           7 - 11 months      117 - 401      117 - 401   12 months -  6 years       158 - 369      158 - 369           7 - 12 years       150 - 409      150 - 409               13 years       156 - 435       78 - 227               14 years       114 - 375       64 - 161               15 years        88 - 279       56 - 134               16 years        74 - 207  51 - 121               17 years        63 - 161       47 - 113          18 - 20 years        51 - 125       42 - 106          21 - 50 years         47 - 123       41 - 116          51 - 80 years        49 - 135       51 - 125               >80 years        48 - 129       48 - 129    AST 20 0 - 40 IU/L   ALT 14 0 - 44 IU/L  TSH     Status: None   Collection Time: 10/30/23 10:02 AM  Result Value Ref Range   TSH 0.961 0.450 - 4.500 uIU/mL      Assessment & Plan:  Goldman was seen today for follow-up.  Primary hypertension -     CBC With Diff/Platelet  CKD stage 3b, GFR 30-44 ml/min (HCC)  Acquired hypothyroidism -     TSH  Mixed hyperlipidemia -     Lipid panel -     Comprehensive metabolic panel with GFR  Simple chronic bronchitis (HCC) -     Ipratropium-Albuterol ; Take 3 mLs by nebulization every 6 (six) hours as needed.  Dispense: 90 mL; Refill: 11  Generalized anxiety disorder -     diazePAM ; Take 1 tablet (10 mg total) by mouth 3 (three) times daily.  Dispense: 90 tablet; Refill: 2  Restless legs syndrome -     Gabapentin ; TAKE 1 CAPSULE BY MOUTH IN THE MORNING AND EVENING AND 3 CAPSULES AT BEDTIME  Dispense: 120 capsule; Refill: 2  Benign prostatic hyperplasia without lower urinary tract symptoms -     PSA  Chronic kidney disease, unspecified -     Furosemide ; Take 1 tablet (20 mg total) by mouth daily as needed for edema (take a pill if gains 0.5 lb on two consecutive days).  Dispense: 90 tablet; Refill: 1    Problem List Items Addressed This Visit       Cardiovascular and Mediastinum   Primary hypertension - Primary   Relevant Medications   furosemide  (LASIX ) 20 MG tablet   Other Relevant Orders   CBC With Diff/Platelet     Endocrine   Hypothyroidism     Genitourinary   CKD stage 3b, GFR 30-44 ml/min (HCC)     Other   Hyperlipidemia   Relevant Medications   furosemide  (LASIX ) 20 MG tablet   Other Visit Diagnoses       Simple chronic bronchitis (HCC)       Relevant Medications   ipratropium-albuterol  (DUONEB) 0.5-2.5 (3) MG/3ML SOLN     Generalized anxiety disorder       Relevant Medications   diazepam  (VALIUM ) 10 MG tablet     Restless legs syndrome       Relevant  Medications   gabapentin  (NEURONTIN ) 300 MG capsule     Benign prostatic hyperplasia without lower urinary tract symptoms  Relevant Orders   PSA     Chronic kidney disease, unspecified       Relevant Medications   furosemide  (LASIX ) 20 MG tablet       Return in about 3 months (around 01/31/2024) for awv with labs prior.   Total time spent: 20 minutes  Sherrill Cinderella Perry, MD  11/01/2023   This document may have been prepared by [facility] Voice Recognition software and as such may include unintentional dictation errors.

## 2023-11-07 ENCOUNTER — Other Ambulatory Visit: Payer: Self-pay | Admitting: Internal Medicine

## 2023-11-07 DIAGNOSIS — F411 Generalized anxiety disorder: Secondary | ICD-10-CM

## 2023-12-18 DIAGNOSIS — M25461 Effusion, right knee: Secondary | ICD-10-CM | POA: Diagnosis not present

## 2023-12-18 DIAGNOSIS — M1711 Unilateral primary osteoarthritis, right knee: Secondary | ICD-10-CM | POA: Diagnosis not present

## 2024-01-12 ENCOUNTER — Other Ambulatory Visit: Payer: Self-pay | Admitting: Internal Medicine

## 2024-01-12 ENCOUNTER — Other Ambulatory Visit: Payer: Self-pay | Admitting: Cardiovascular Disease

## 2024-01-12 DIAGNOSIS — J42 Unspecified chronic bronchitis: Secondary | ICD-10-CM

## 2024-01-12 DIAGNOSIS — I480 Paroxysmal atrial fibrillation: Secondary | ICD-10-CM

## 2024-01-28 ENCOUNTER — Other Ambulatory Visit: Payer: Self-pay | Admitting: Internal Medicine

## 2024-01-28 DIAGNOSIS — E781 Pure hyperglyceridemia: Secondary | ICD-10-CM

## 2024-02-02 ENCOUNTER — Other Ambulatory Visit: Payer: Self-pay | Admitting: Cardiovascular Disease

## 2024-02-02 ENCOUNTER — Other Ambulatory Visit: Payer: Self-pay | Admitting: Internal Medicine

## 2024-02-02 ENCOUNTER — Other Ambulatory Visit

## 2024-02-02 ENCOUNTER — Encounter: Payer: Self-pay | Admitting: Cardiovascular Disease

## 2024-02-02 ENCOUNTER — Ambulatory Visit: Admitting: Cardiovascular Disease

## 2024-02-02 VITALS — BP 128/72 | HR 90 | Ht 70.0 in | Wt 212.0 lb

## 2024-02-02 DIAGNOSIS — I4891 Unspecified atrial fibrillation: Secondary | ICD-10-CM

## 2024-02-02 DIAGNOSIS — Z01818 Encounter for other preprocedural examination: Secondary | ICD-10-CM

## 2024-02-02 DIAGNOSIS — N1832 Chronic kidney disease, stage 3b: Secondary | ICD-10-CM | POA: Diagnosis not present

## 2024-02-02 DIAGNOSIS — R0602 Shortness of breath: Secondary | ICD-10-CM | POA: Diagnosis not present

## 2024-02-02 DIAGNOSIS — I639 Cerebral infarction, unspecified: Secondary | ICD-10-CM | POA: Diagnosis not present

## 2024-02-02 DIAGNOSIS — E781 Pure hyperglyceridemia: Secondary | ICD-10-CM | POA: Diagnosis not present

## 2024-02-02 DIAGNOSIS — I480 Paroxysmal atrial fibrillation: Secondary | ICD-10-CM | POA: Diagnosis not present

## 2024-02-02 DIAGNOSIS — I10 Essential (primary) hypertension: Secondary | ICD-10-CM | POA: Diagnosis not present

## 2024-02-02 DIAGNOSIS — M25532 Pain in left wrist: Secondary | ICD-10-CM

## 2024-02-02 DIAGNOSIS — E782 Mixed hyperlipidemia: Secondary | ICD-10-CM | POA: Diagnosis not present

## 2024-02-02 MED ORDER — AMIODARONE HCL 200 MG PO TABS: 200.0000 mg | ORAL_TABLET | Freq: Every day | ORAL | 0 refills | Status: AC

## 2024-02-02 MED ORDER — APIXABAN 5 MG PO TABS: 5.0000 mg | ORAL_TABLET | Freq: Two times a day (BID) | ORAL | 3 refills | Status: AC

## 2024-02-02 NOTE — Progress Notes (Signed)
 Cardiology Office Note   Date:  02/02/2024   ID:  Zymarion, Favorite 08-Dec-1955, MRN [id number]  PCP:  Albina GORMAN Dine, MD  Cardiologist:  Denyse Bathe, MD      History of Present Illness: Perry Martinez is a 68 y.o. male who presents for  Chief Complaint  Patient presents with   Medication Refill    Medication Refill     Needs knee replacement. Has h/o CVA and with occasional SOB.  Medication Refill      Past Medical History:  Diagnosis Date   Acute blood loss anemia 04/09/2020   Acute CVA (cerebrovascular accident) (HCC) 08/09/2018   AKI (acute kidney injury) 04/09/2020   Atrial fibrillation (HCC)    Depression    Hyperlipidemia    Hypertension    Hypothyroidism 06/28/2016   OSA on CPAP 06/16/2020   Substance abuse (HCC)    IV drugs when younger     Past Surgical History:  Procedure Laterality Date   COLONOSCOPY N/A 04/11/2020   Procedure: COLONOSCOPY;  Surgeon: Toledo, Ladell POUR, MD;  Location: [facility];  Service: Gastroenterology;  Laterality: N/A;   ESOPHAGOGASTRODUODENOSCOPY N/A 04/11/2020   Procedure: ESOPHAGOGASTRODUODENOSCOPY (EGD);  Surgeon: Toledo, Ladell POUR, MD;  Location: [facility];  Service: Gastroenterology;  Laterality: N/A;   FRACTURE SURGERY     right foot   ROTATOR CUFF REPAIR     right shoulder     Current Outpatient Medications  Medication Sig Dispense Refill   cyclobenzaprine  (FLEXERIL ) 5 MG tablet Take 1 tablet (5 mg total) by mouth 3 (three) times daily as needed. 15 tablet 0   diazepam  (VALIUM ) 10 MG tablet TAKE 1 TABLET BY MOUTH THREE TIMES A DAY 90 tablet 2   fenofibrate  160 MG tablet TAKE 1 TABLET BY MOUTH EVERY DAY 90 tablet 1   furosemide  (LASIX ) 20 MG tablet Take 1 tablet (20 mg total) by mouth daily as needed for edema (take a pill if gains 0.5 lb on two consecutive days). 90 tablet 1   gabapentin  (NEURONTIN ) 300 MG capsule TAKE 1 CAPSULE BY MOUTH IN THE MORNING AND EVENING AND 3 CAPSULES AT BEDTIME 120  capsule 2   ipratropium-albuterol  (DUONEB) 0.5-2.5 (3) MG/3ML SOLN Take 3 mLs by nebulization every 6 (six) hours as needed. 90 mL 11   levothyroxine  (SYNTHROID ) 150 MCG tablet TAKE 1 TABLET BY MOUTH EVERY DAY IN THE MORNING 90 tablet 1   metoprolol  succinate (TOPROL -XL) 25 MG 24 hr tablet TAKE 1 TABLET BY MOUTH EVERY DAY 90 tablet 1   rosuvastatin  (CRESTOR ) 40 MG tablet Take 1 tablet (40 mg total) by mouth daily. 90 tablet 1   TRELEGY ELLIPTA  100-62.5-25 MCG/ACT AEPB USE 1 INHALTION BY MOUTH EVERY DAY AS DIRECTED 60 each 3   amiodarone  (PACERONE ) 200 MG tablet Take 1 tablet (200 mg total) by mouth daily. 90 tablet 0   apixaban  (ELIQUIS ) 5 MG TABS tablet Take 1 tablet (5 mg total) by mouth 2 (two) times daily. 60 tablet 3   No current facility-administered medications for this visit.    Allergies:   Pollen extract    Social History:   reports that he has quit smoking. His smoking use included cigarettes. He started smoking about 57 years ago. He has a 28.8 pack-year smoking history. He has never used smokeless tobacco. He reports that he does not drink alcohol and does not use drugs.   Family History:  family history includes Arthritis in his father and mother; Diabetes in  his mother; Heart disease in his mother; Heart disease (age of onset: 80) in his father; Hyperlipidemia in his father; Hypertension in his father; Rheumatic fever in his father.    ROS:     Review of Systems  Constitutional: Negative.   HENT: Negative.    Eyes: Negative.   Respiratory: Negative.    Gastrointestinal: Negative.   Genitourinary: Negative.   Musculoskeletal: Negative.   Skin: Negative.   Neurological: Negative.   Endo/Heme/Allergies: Negative.   Psychiatric/Behavioral: Negative.    All other systems reviewed and are negative.     All other systems are reviewed and negative.    PHYSICAL EXAM: VS:  BP 128/72   Pulse 90   Ht 5' 10 (1.778 m)   Wt 212 lb (96.2 kg)   SpO2 95%   BMI 30.42 kg/m   , BMI Body mass index is 30.42 kg/m. Last weight:  Wt Readings from Last 3 Encounters:  02/02/24 212 lb (96.2 kg)  11/01/23 209 lb 12.8 oz (95.2 kg)  08/08/23 209 lb 12.8 oz (95.2 kg)     Physical Exam Vitals reviewed.  Constitutional:      Appearance: Normal appearance. He is normal weight.  HENT:     Head: Normocephalic.     Nose: Nose normal.     Mouth/Throat:     Mouth: Mucous membranes are moist.  Eyes:     Pupils: Pupils are equal, round, and reactive to light.  Cardiovascular:     Rate and Rhythm: Normal rate and regular rhythm.     Pulses: Normal pulses.     Heart sounds: Normal heart sounds.  Pulmonary:     Effort: Pulmonary effort is normal.  Abdominal:     General: Abdomen is flat. Bowel sounds are normal.  Musculoskeletal:        General: Normal range of motion.     Cervical back: Normal range of motion.  Skin:    General: Skin is warm.  Neurological:     General: No focal deficit present.     Mental Status: He is alert.  Psychiatric:        Mood and Affect: Mood normal.       EKG:   Recent Labs: 10/30/2023: ALT 14; BUN 28; Creatinine, Ser 1.61; Potassium 4.6; Sodium 142; TSH 0.961    Lipid Panel    Component Value Date/Time   CHOL 146 10/30/2023 1001   TRIG 163 (H) 10/30/2023 1001   HDL 42 10/30/2023 1001   CHOLHDL 3.5 10/30/2023 1001   CHOLHDL 5.4 08/10/2018 0426   VLDL 69 (H) 08/10/2018 0426   LDLCALC 76 10/30/2023 1001      Other studies Reviewed: Additional studies/ records that were reviewed today include:  Review of the above records demonstrates:       No data to display            ASSESSMENT AND PLAN:    ICD-10-CM   1. Preoperative clearance  Z01.818 PCV ECHOCARDIOGRAM COMPLETE    MYOCARDIAL PERFUSION IMAGING   Needs clearance with h/o CVA, need to r/o CAD prior to knee surgery. ECHO/STRESS test.    2. Paroxysmal atrial fibrillation (HCC)  I48.0 PCV ECHOCARDIOGRAM COMPLETE    MYOCARDIAL PERFUSION IMAGING     amiodarone  (PACERONE ) 200 MG tablet    3. Pure hyperglyceridemia  E78.1 PCV ECHOCARDIOGRAM COMPLETE    MYOCARDIAL PERFUSION IMAGING    4. Primary hypertension  I10 PCV ECHOCARDIOGRAM COMPLETE    MYOCARDIAL PERFUSION IMAGING  5. CKD stage 3b, GFR 30-44 ml/min (HCC)  N18.32 PCV ECHOCARDIOGRAM COMPLETE    MYOCARDIAL PERFUSION IMAGING    6. Mixed hyperlipidemia  E78.2 PCV ECHOCARDIOGRAM COMPLETE    MYOCARDIAL PERFUSION IMAGING    7. SOB (shortness of breath)  R06.02 PCV ECHOCARDIOGRAM COMPLETE    MYOCARDIAL PERFUSION IMAGING    8. Cerebrovascular accident (CVA), unspecified mechanism (HCC)  I63.9 PCV ECHOCARDIOGRAM COMPLETE    MYOCARDIAL PERFUSION IMAGING    9. Unspecified atrial fibrillation (HCC)  I48.91 apixaban  (ELIQUIS ) 5 MG TABS tablet       Problem List Items Addressed This Visit       Cardiovascular and Mediastinum   CVA (cerebral vascular accident) (HCC)   Relevant Medications   amiodarone  (PACERONE ) 200 MG tablet   apixaban  (ELIQUIS ) 5 MG TABS tablet   Other Relevant Orders   PCV ECHOCARDIOGRAM COMPLETE   MYOCARDIAL PERFUSION IMAGING   Atrial fibrillation (HCC)   Relevant Medications   amiodarone  (PACERONE ) 200 MG tablet   apixaban  (ELIQUIS ) 5 MG TABS tablet   Other Relevant Orders   PCV ECHOCARDIOGRAM COMPLETE   MYOCARDIAL PERFUSION IMAGING   Primary hypertension   Relevant Medications   amiodarone  (PACERONE ) 200 MG tablet   apixaban  (ELIQUIS ) 5 MG TABS tablet   Other Relevant Orders   PCV ECHOCARDIOGRAM COMPLETE   MYOCARDIAL PERFUSION IMAGING     Genitourinary   CKD stage 3b, GFR 30-44 ml/min (HCC)   Relevant Orders   PCV ECHOCARDIOGRAM COMPLETE   MYOCARDIAL PERFUSION IMAGING     Other   Hyperlipidemia   Relevant Medications   amiodarone  (PACERONE ) 200 MG tablet   apixaban  (ELIQUIS ) 5 MG TABS tablet   Other Relevant Orders   PCV ECHOCARDIOGRAM COMPLETE   MYOCARDIAL PERFUSION IMAGING   Other Visit Diagnoses       Preoperative clearance    -   Primary   Needs clearance with h/o CVA, need to r/o CAD prior to knee surgery. ECHO/STRESS test.   Relevant Orders   PCV ECHOCARDIOGRAM COMPLETE   MYOCARDIAL PERFUSION IMAGING     Pure hyperglyceridemia       Relevant Medications   amiodarone  (PACERONE ) 200 MG tablet   apixaban  (ELIQUIS ) 5 MG TABS tablet   Other Relevant Orders   PCV ECHOCARDIOGRAM COMPLETE   MYOCARDIAL PERFUSION IMAGING     SOB (shortness of breath)       Relevant Orders   PCV ECHOCARDIOGRAM COMPLETE   MYOCARDIAL PERFUSION IMAGING     Unspecified atrial fibrillation (HCC)       Relevant Medications   amiodarone  (PACERONE ) 200 MG tablet   apixaban  (ELIQUIS ) 5 MG TABS tablet   Other Relevant Orders   PCV ECHOCARDIOGRAM COMPLETE   MYOCARDIAL PERFUSION IMAGING          Disposition:   Return in about 5 weeks (around 03/08/2024) for echo, stress test and f/u.    Total time spent: 50 minutes  Signed,  Denyse Bathe, MD  02/02/2024 11:11 AM    [facility]

## 2024-02-03 LAB — CBC WITH DIFF/PLATELET
Basophils Absolute: 0.1 x10E3/uL (ref 0.0–0.2)
Basos: 1 %
EOS (ABSOLUTE): 0.1 x10E3/uL (ref 0.0–0.4)
Eos: 2 %
Hematocrit: 41.9 % (ref 37.5–51.0)
Hemoglobin: 13.6 g/dL (ref 13.0–17.7)
Immature Grans (Abs): 0 x10E3/uL (ref 0.0–0.1)
Immature Granulocytes: 0 %
Lymphocytes Absolute: 1.6 x10E3/uL (ref 0.7–3.1)
Lymphs: 20 %
MCH: 28.3 pg (ref 26.6–33.0)
MCHC: 32.5 g/dL (ref 31.5–35.7)
MCV: 87 fL (ref 79–97)
Monocytes Absolute: 0.7 x10E3/uL (ref 0.1–0.9)
Monocytes: 8 %
Neutrophils Absolute: 5.4 x10E3/uL (ref 1.4–7.0)
Neutrophils: 69 %
Platelets: 202 x10E3/uL (ref 150–450)
RBC: 4.81 x10E6/uL (ref 4.14–5.80)
RDW: 14.1 % (ref 11.6–15.4)
WBC: 7.8 x10E3/uL (ref 3.4–10.8)

## 2024-02-03 LAB — COMPREHENSIVE METABOLIC PANEL WITH GFR
ALT: 20 IU/L (ref 0–44)
AST: 22 IU/L (ref 0–40)
Albumin: 4.3 g/dL (ref 3.9–4.9)
Alkaline Phosphatase: 48 IU/L (ref 47–123)
BUN/Creatinine Ratio: 13 (ref 10–24)
BUN: 20 mg/dL (ref 8–27)
Bilirubin Total: 0.3 mg/dL (ref 0.0–1.2)
CO2: 23 mmol/L (ref 20–29)
Calcium: 8.9 mg/dL (ref 8.6–10.2)
Chloride: 103 mmol/L (ref 96–106)
Creatinine, Ser: 1.57 mg/dL — ABNORMAL HIGH (ref 0.76–1.27)
Globulin, Total: 2.4 g/dL (ref 1.5–4.5)
Glucose: 105 mg/dL — ABNORMAL HIGH (ref 70–99)
Potassium: 4.9 mmol/L (ref 3.5–5.2)
Sodium: 140 mmol/L (ref 134–144)
Total Protein: 6.7 g/dL (ref 6.0–8.5)
eGFR: 47 mL/min/1.73 — ABNORMAL LOW (ref >59)

## 2024-02-03 LAB — PSA: Prostate Specific Ag, Serum: 0.8 ng/mL (ref 0.0–4.0)

## 2024-02-03 LAB — LIPID PANEL
Chol/HDL Ratio: 3 ratio (ref 0.0–5.0)
Cholesterol, Total: 139 mg/dL (ref 100–199)
HDL: 47 mg/dL (ref >39)
LDL Chol Calc (NIH): 69 mg/dL (ref 0–99)
Triglycerides: 129 mg/dL (ref 0–149)
VLDL Cholesterol Cal: 23 mg/dL (ref 5–40)

## 2024-02-03 LAB — TSH: TSH: 1.57 u[IU]/mL (ref 0.450–4.500)

## 2024-02-04 ENCOUNTER — Other Ambulatory Visit: Payer: Self-pay | Admitting: Internal Medicine

## 2024-02-04 DIAGNOSIS — F411 Generalized anxiety disorder: Secondary | ICD-10-CM

## 2024-02-06 ENCOUNTER — Ambulatory Visit: Payer: Self-pay | Admitting: Internal Medicine

## 2024-02-06 ENCOUNTER — Ambulatory Visit: Admitting: Internal Medicine

## 2024-02-06 VITALS — BP 130/82 | HR 58 | Temp 97.9°F | Ht 70.0 in | Wt 215.0 lb

## 2024-02-06 DIAGNOSIS — E782 Mixed hyperlipidemia: Secondary | ICD-10-CM

## 2024-02-06 DIAGNOSIS — Z1331 Encounter for screening for depression: Secondary | ICD-10-CM

## 2024-02-06 DIAGNOSIS — G2581 Restless legs syndrome: Secondary | ICD-10-CM | POA: Diagnosis not present

## 2024-02-06 DIAGNOSIS — E039 Hypothyroidism, unspecified: Secondary | ICD-10-CM

## 2024-02-06 DIAGNOSIS — N1832 Chronic kidney disease, stage 3b: Secondary | ICD-10-CM | POA: Diagnosis not present

## 2024-02-06 DIAGNOSIS — J410 Simple chronic bronchitis: Secondary | ICD-10-CM

## 2024-02-06 DIAGNOSIS — Z0130 Encounter for examination of blood pressure without abnormal findings: Secondary | ICD-10-CM

## 2024-02-06 DIAGNOSIS — Z739 Problem related to life management difficulty, unspecified: Secondary | ICD-10-CM | POA: Diagnosis not present

## 2024-02-06 DIAGNOSIS — Z0001 Encounter for general adult medical examination with abnormal findings: Secondary | ICD-10-CM | POA: Diagnosis not present

## 2024-02-06 MED ORDER — GABAPENTIN 300 MG PO CAPS: ORAL_CAPSULE | ORAL | 2 refills | Status: AC

## 2024-02-06 MED ORDER — LEVOTHYROXINE SODIUM 150 MCG PO TABS: 150.0000 ug | ORAL_TABLET | Freq: Every day | ORAL | 1 refills | Status: AC

## 2024-02-06 MED ORDER — ROSUVASTATIN CALCIUM 40 MG PO TABS: 40.0000 mg | ORAL_TABLET | Freq: Every day | ORAL | 1 refills | Status: AC

## 2024-02-06 NOTE — Progress Notes (Signed)
 Established Patient Office Visit  Subjective:  Patient ID: Perry Martinez, male    DOB: 04/10/1955  Age: 68 y.o. MRN: [id number]  Chief Complaint  Patient presents with   Follow-up    3 month AWV labs     No new complaints, here for AWV refer to quality metrics and scanned documents.   LDL and TC well controlled on lab review. Triglycerides also satisfactory with stable ckd on cmp review, normal psa, cbc and tsh.    No other concerns at this time.   Past Medical History:  Diagnosis Date   Acute blood loss anemia 04/09/2020   Acute CVA (cerebrovascular accident) (HCC) 08/09/2018   AKI (acute kidney injury) 04/09/2020   Atrial fibrillation (HCC)    Depression    Hyperlipidemia    Hypertension    Hypothyroidism 06/28/2016   OSA on CPAP 06/16/2020   Substance abuse (HCC)    IV drugs when younger    Past Surgical History:  Procedure Laterality Date   COLONOSCOPY N/A 04/11/2020   Procedure: COLONOSCOPY;  Surgeon: Toledo, Ladell POUR, MD;  Location: [facility];  Service: Gastroenterology;  Laterality: N/A;   ESOPHAGOGASTRODUODENOSCOPY N/A 04/11/2020   Procedure: ESOPHAGOGASTRODUODENOSCOPY (EGD);  Surgeon: Toledo, Ladell POUR, MD;  Location: [facility]OS[facility];  Service: Gastroenterology;  Laterality: N/A;   FRACTURE SURGERY     right foot   ROTATOR CUFF REPAIR     right shoulder    Social History   Socioeconomic History   Marital status: Single    Spouse name: [name]   Number of children: 2   Years of education: GED/ tech   Highest education level: Not on file  Occupational History   Occupation: build houses    Comment: self  Tobacco Use   Smoking status: Former    Current packs/day: 0.50    Average packs/day: 0.5 packs/day for 57.6 years (28.8 ttl pk-yrs)    Types: Cigarettes    Start date: 06/28/1966   Smokeless tobacco: Never   Tobacco comments:    previously smoked 1ppd  Vaping Use   Vaping status: Never Used  Substance and Sexual Activity   Alcohol use: No    Drug use: No   Sexual activity: Not Currently  Other Topics Concern   Not on file  Social History Narrative   Lives with Angeline and her 17 year old daughter and 47 year old son   Self employed   - build houses   Social Drivers of Health   Tobacco Use: Medium Risk (02/02/2024)   Patient History    Smoking Tobacco Use: Former    Smokeless Tobacco Use: Never    Passive Exposure: Not on Actuary Strain: Not on file  Food Insecurity: No Food Insecurity (07/14/2022)   Hunger Vital Sign    Worried About Running Out of Food in the Last Year: Never true    Ran Out of Food in the Last Year: Never true  Transportation Needs: Not on file  Physical Activity: Not on file  Stress: Not on file  Social Connections: Not on file  Intimate Partner Violence: Not At Risk (07/14/2022)   Humiliation, Afraid, Rape, and Kick questionnaire    Fear of Current or Ex-Partner: No    Emotionally Abused: No    Physically Abused: No    Sexually Abused: No  Depression (PHQ2-9): Medium Risk (01/18/2023)   Depression (PHQ2-9)    PHQ-2 Score: 6  Alcohol Screen: Not on file  Housing: Low Risk (07/14/2022)  Housing    Last Housing Risk Score: 0  Utilities: Not on file  Health Literacy: Not on file    Family History  Problem Relation Age of Onset   Heart disease Mother        dieed at 55   Arthritis Mother    Diabetes Mother    Heart disease Father 42       bypass   Arthritis Father    Hyperlipidemia Father    Hypertension Father    Rheumatic fever Father     Allergies[1]  Show/hide medication list[2]  Review of Systems  Constitutional:  Negative for weight loss (gained 3 lbs).  HENT: Negative.    Eyes: Negative.   Respiratory: Negative.    Cardiovascular: Negative.   Gastrointestinal: Negative.   Genitourinary: Negative.   Musculoskeletal:  Positive for joint pain (right knee).  Skin: Negative.   Neurological:  Positive for dizziness.  Endo/Heme/Allergies: Negative.    Psychiatric/Behavioral:  The patient is nervous/anxious.        Objective:   BP 130/82   Pulse (!) 58   Temp 97.9 F (36.6 C)   Ht 5' 10 (1.778 m)   Wt 215 lb (97.5 kg)   SpO2 95%   BMI 30.85 kg/m   Vitals:   02/06/24 1256  BP: 130/82  Pulse: (!) 58  Temp: 97.9 F (36.6 C)  Height: 5' 10 (1.778 m)  Weight: 215 lb (97.5 kg)  SpO2: 95%  BMI (Calculated): 30.85    Physical Exam Vitals reviewed.  Constitutional:      Appearance: Normal appearance.  HENT:     Head: Normocephalic.     Left Ear: There is no impacted cerumen.     Nose: Nose normal.     Mouth/Throat:     Mouth: Mucous membranes are moist.     Pharynx: No posterior oropharyngeal erythema.  Eyes:     Extraocular Movements: Extraocular movements intact.     Pupils: Pupils are equal, round, and reactive to light.  Cardiovascular:     Rate and Rhythm: Regular rhythm.     Chest Wall: PMI is not displaced.     Pulses: Normal pulses.     Heart sounds: Normal heart sounds. No murmur heard. Pulmonary:     Effort: Pulmonary effort is normal.     Breath sounds: Normal air entry. No rhonchi or rales.  Abdominal:     General: Abdomen is flat. Bowel sounds are normal. There is no distension.     Palpations: Abdomen is soft. There is no hepatomegaly, splenomegaly or mass.     Tenderness: There is no abdominal tenderness.  Musculoskeletal:        General: Swelling present. Normal range of motion.     Left wrist: Swelling and tenderness present. No effusion or lacerations.     Cervical back: Normal range of motion and neck supple.     Right lower leg: No edema.     Left lower leg: No edema.     Comments: Wrist swelling has improved  Skin:    General: Skin is warm and dry.  Neurological:     General: No focal deficit present.     Mental Status: He is alert and oriented to person, place, and time.     Cranial Nerves: No cranial nerve deficit.     Motor: No weakness.  Psychiatric:        Mood and Affect:  Mood normal.        Behavior: Behavior  normal.      No results found for any visits on 02/06/24.  Recent Results (from the past 2160 hours)  Comprehensive metabolic panel     Status: Abnormal   Collection Time: 02/02/24 10:12 AM  Result Value Ref Range   Glucose 105 (H) 70 - 99 mg/dL   BUN 20 8 - 27 mg/dL   Creatinine, Ser 8.42 (H) 0.76 - 1.27 mg/dL   eGFR 47 (L) >40 fO/fpw/8.26   BUN/Creatinine Ratio 13 10 - 24   Sodium 140 134 - 144 mmol/L   Potassium 4.9 3.5 - 5.2 mmol/L   Chloride 103 96 - 106 mmol/L   CO2 23 20 - 29 mmol/L   Calcium  8.9 8.6 - 10.2 mg/dL   Total Protein 6.7 6.0 - 8.5 g/dL   Albumin 4.3 3.9 - 4.9 g/dL   Globulin, Total 2.4 1.5 - 4.5 g/dL   Bilirubin Total 0.3 0.0 - 1.2 mg/dL   Alkaline Phosphatase 48 47 - 123 IU/L   AST 22 0 - 40 IU/L   ALT 20 0 - 44 IU/L  TSH     Status: None   Collection Time: 02/02/24 10:13 AM  Result Value Ref Range   TSH 1.570 0.450 - 4.500 uIU/mL  Lipid panel     Status: None   Collection Time: 02/02/24 10:14 AM  Result Value Ref Range   Cholesterol, Total 139 100 - 199 mg/dL   Triglycerides 870 0 - 149 mg/dL   HDL 47 >60 mg/dL   VLDL Cholesterol Cal 23 5 - 40 mg/dL   LDL Chol Calc (NIH) 69 0 - 99 mg/dL   Chol/HDL Ratio 3.0 0.0 - 5.0 ratio    Comment:                                   T. Chol/HDL Ratio                                             Men  Women                               1/2 Avg.Risk  3.4    3.3                                   Avg.Risk  5.0    4.4                                2X Avg.Risk  9.6    7.1                                3X Avg.Risk 23.4   11.0   CBC With Diff/Platelet     Status: None   Collection Time: 02/02/24 10:14 AM  Result Value Ref Range   WBC 7.8 3.4 - 10.8 x10E3/uL   RBC 4.81 4.14 - 5.80 x10E6/uL   Hemoglobin 13.6 13.0 - 17.7 g/dL   Hematocrit 58.0 62.4 - 51.0 %   MCV 87 79 - 97 fL   MCH 28.3 26.6 -  33.0 pg   MCHC 32.5 31.5 - 35.7 g/dL   RDW 85.8 88.3 - 84.5 %   Platelets 202  150 - 450 x10E3/uL   Neutrophils 69 Not Estab. %   Lymphs 20 Not Estab. %   Monocytes 8 Not Estab. %   Eos 2 Not Estab. %   Basos 1 Not Estab. %   Neutrophils Absolute 5.4 1.4 - 7.0 x10E3/uL   Lymphocytes Absolute 1.6 0.7 - 3.1 x10E3/uL   Monocytes Absolute 0.7 0.1 - 0.9 x10E3/uL   EOS (ABSOLUTE) 0.1 0.0 - 0.4 x10E3/uL   Basophils Absolute 0.1 0.0 - 0.2 x10E3/uL   Immature Granulocytes 0 Not Estab. %   Immature Grans (Abs) 0.0 0.0 - 0.1 x10E3/uL  PSA     Status: None   Collection Time: 02/02/24 10:14 AM  Result Value Ref Range   Prostate Specific Ag, Serum 0.8 0.0 - 4.0 ng/mL    Comment: Roche ECLIA methodology. According to the American Urological Association, Serum PSA should decrease and remain at undetectable levels after radical prostatectomy. The AUA defines biochemical recurrence as an initial PSA value 0.2 ng/mL or greater followed by a subsequent confirmatory PSA value 0.2 ng/mL or greater. Values obtained with different assay methods or kits cannot be used interchangeably. Results cannot be interpreted as absolute evidence of the presence or absence of malignant disease.       Assessment & Plan:  Perry Martinez was seen today for follow-up.  CKD stage 3b, GFR 30-44 ml/min (HCC)  Mixed hyperlipidemia -     Lipid panel -     Comprehensive metabolic panel with GFR -     Rosuvastatin  Calcium ; Take 1 tablet (40 mg total) by mouth daily.  Dispense: 90 tablet; Refill: 1  Restless legs syndrome -     Gabapentin ; TAKE 1 CAPSULE BY MOUTH IN THE MORNING AND EVENING AND 3 CAPSULES AT BEDTIME  Dispense: 120 capsule; Refill: 2  Acquired hypothyroidism -     TSH -     Levothyroxine  Sodium; Take 1 tablet (150 mcg total) by mouth daily before breakfast.  Dispense: 90 tablet; Refill: 1  Simple chronic bronchitis (HCC)  Declines SSRI for high depression score.  Problem List Items Addressed This Visit       Endocrine   Hypothyroidism   Relevant Medications   levothyroxine   (SYNTHROID ) 150 MCG tablet     Genitourinary   CKD stage 3b, GFR 30-44 ml/min (HCC) - Primary     Other   Hyperlipidemia   Relevant Medications   rosuvastatin  (CRESTOR ) 40 MG tablet   Other Visit Diagnoses       Restless legs syndrome       Relevant Medications   gabapentin  (NEURONTIN ) 300 MG capsule     Simple chronic bronchitis (HCC)           Return in about 3 months (around 05/06/2024) for fu with labs prior.   Total time spent: 30 minutes. This time includes review of previous notes and results and patient face to face interaction during today'Perry Martinez visit.    Sherrill Cinderella Perry, MD  02/06/2024   This document may have been prepared by [facility] Voice Recognition software and as such may include unintentional dictation errors.     [1]  Allergies Allergen Reactions   Pollen Extract     Other reaction(Tyliek Timberman): Cough  [2]  Outpatient Medications Prior to Visit  Medication Sig   amiodarone  (PACERONE ) 200 MG tablet Take 1 tablet (200 mg total) by mouth daily.  apixaban  (ELIQUIS ) 5 MG TABS tablet Take 1 tablet (5 mg total) by mouth 2 (two) times daily.   cyclobenzaprine  (FLEXERIL ) 5 MG tablet Take 1 tablet (5 mg total) by mouth 3 (three) times daily as needed.   diazepam  (VALIUM ) 10 MG tablet TAKE 1 TABLET BY MOUTH THREE TIMES A DAY   ELIQUIS  5 MG TABS tablet TAKE 1 TABLET BY MOUTH TWICE A DAY   fenofibrate  160 MG tablet TAKE 1 TABLET BY MOUTH EVERY DAY   ipratropium-albuterol  (DUONEB) 0.5-2.5 (3) MG/3ML SOLN Take 3 mLs by nebulization every 6 (six) hours as needed.   traMADol (ULTRAM) 50 MG tablet Take 50 mg by mouth as needed for moderate pain (pain score 4-6).   TRELEGY ELLIPTA  100-62.5-25 MCG/ACT AEPB USE 1 INHALTION BY MOUTH EVERY DAY AS DIRECTED   [DISCONTINUED] gabapentin  (NEURONTIN ) 300 MG capsule TAKE 1 CAPSULE BY MOUTH IN THE MORNING AND EVENING AND 3 CAPSULES AT BEDTIME   [DISCONTINUED] levothyroxine  (SYNTHROID ) 150 MCG tablet TAKE 1 TABLET BY MOUTH EVERY DAY IN  THE MORNING   [DISCONTINUED] rosuvastatin  (CRESTOR ) 40 MG tablet Take 1 tablet (40 mg total) by mouth daily.   celecoxib  (CELEBREX ) 400 MG capsule TAKE 1 CAPSULE (400 MG TOTAL) BY MOUTH DAILY AFTER BREAKFAST. (Patient not taking: Reported on 02/06/2024)   furosemide  (LASIX ) 20 MG tablet Take 1 tablet (20 mg total) by mouth daily as needed for edema (take a pill if gains 0.5 lb on two consecutive days). (Patient not taking: Reported on 02/06/2024)   metoprolol  succinate (TOPROL -XL) 25 MG 24 hr tablet TAKE 1 TABLET BY MOUTH EVERY DAY (Patient not taking: Reported on 02/06/2024)   No facility-administered medications prior to visit.

## 2024-02-29 ENCOUNTER — Other Ambulatory Visit

## 2024-02-29 ENCOUNTER — Other Ambulatory Visit: Payer: Self-pay | Admitting: Internal Medicine

## 2024-02-29 DIAGNOSIS — M25532 Pain in left wrist: Secondary | ICD-10-CM

## 2024-03-06 ENCOUNTER — Ambulatory Visit

## 2024-03-06 DIAGNOSIS — E782 Mixed hyperlipidemia: Secondary | ICD-10-CM

## 2024-03-06 DIAGNOSIS — I361 Nonrheumatic tricuspid (valve) insufficiency: Secondary | ICD-10-CM | POA: Diagnosis not present

## 2024-03-06 DIAGNOSIS — N1832 Chronic kidney disease, stage 3b: Secondary | ICD-10-CM

## 2024-03-06 DIAGNOSIS — E781 Pure hyperglyceridemia: Secondary | ICD-10-CM

## 2024-03-06 DIAGNOSIS — I480 Paroxysmal atrial fibrillation: Secondary | ICD-10-CM

## 2024-03-06 DIAGNOSIS — I639 Cerebral infarction, unspecified: Secondary | ICD-10-CM

## 2024-03-06 DIAGNOSIS — Z01818 Encounter for other preprocedural examination: Secondary | ICD-10-CM

## 2024-03-06 DIAGNOSIS — R0602 Shortness of breath: Secondary | ICD-10-CM

## 2024-03-06 DIAGNOSIS — I10 Essential (primary) hypertension: Secondary | ICD-10-CM

## 2024-03-07 ENCOUNTER — Ambulatory Visit

## 2024-03-07 DIAGNOSIS — E781 Pure hyperglyceridemia: Secondary | ICD-10-CM

## 2024-03-07 DIAGNOSIS — I639 Cerebral infarction, unspecified: Secondary | ICD-10-CM

## 2024-03-07 DIAGNOSIS — E782 Mixed hyperlipidemia: Secondary | ICD-10-CM

## 2024-03-07 DIAGNOSIS — I10 Essential (primary) hypertension: Secondary | ICD-10-CM

## 2024-03-07 DIAGNOSIS — I480 Paroxysmal atrial fibrillation: Secondary | ICD-10-CM

## 2024-03-07 DIAGNOSIS — R0602 Shortness of breath: Secondary | ICD-10-CM | POA: Diagnosis not present

## 2024-03-07 DIAGNOSIS — N1832 Chronic kidney disease, stage 3b: Secondary | ICD-10-CM

## 2024-03-07 DIAGNOSIS — Z01818 Encounter for other preprocedural examination: Secondary | ICD-10-CM

## 2024-03-07 MED ORDER — TECHNETIUM TC 99M SESTAMIBI GENERIC - CARDIOLITE
10.8000 | Freq: Once | INTRAVENOUS | Status: AC | PRN
  Administered 2024-03-07: 10.8 via INTRAVENOUS

## 2024-03-07 MED ORDER — TECHNETIUM TC 99M SESTAMIBI GENERIC - CARDIOLITE
30.7000 | Freq: Once | INTRAVENOUS | Status: AC | PRN
  Administered 2024-03-07: 30.7 via INTRAVENOUS

## 2024-03-12 ENCOUNTER — Ambulatory Visit: Admitting: Cardiovascular Disease

## 2024-03-12 ENCOUNTER — Encounter: Payer: Self-pay | Admitting: Cardiovascular Disease

## 2024-03-12 VITALS — BP 126/68 | HR 60 | Ht 70.0 in | Wt 215.4 lb

## 2024-03-12 DIAGNOSIS — R0602 Shortness of breath: Secondary | ICD-10-CM

## 2024-03-12 DIAGNOSIS — I10 Essential (primary) hypertension: Secondary | ICD-10-CM | POA: Diagnosis not present

## 2024-03-12 DIAGNOSIS — I480 Paroxysmal atrial fibrillation: Secondary | ICD-10-CM

## 2024-03-12 DIAGNOSIS — I639 Cerebral infarction, unspecified: Secondary | ICD-10-CM

## 2024-03-12 DIAGNOSIS — E781 Pure hyperglyceridemia: Secondary | ICD-10-CM

## 2024-03-12 DIAGNOSIS — Z01818 Encounter for other preprocedural examination: Secondary | ICD-10-CM

## 2024-03-12 DIAGNOSIS — N1832 Chronic kidney disease, stage 3b: Secondary | ICD-10-CM

## 2024-03-12 DIAGNOSIS — E782 Mixed hyperlipidemia: Secondary | ICD-10-CM | POA: Diagnosis not present

## 2024-03-12 MED ORDER — METOPROLOL SUCCINATE ER 25 MG PO TB24: 25.0000 mg | ORAL_TABLET | Freq: Every day | ORAL | 1 refills | Status: AC

## 2024-03-12 NOTE — Progress Notes (Signed)
 "     Cardiology Office Note   Date:  03/12/2024   ID:  Jensen, Kilburg 1956/01/21, MRN [id number]  PCP:  Albina GORMAN Dine, MD  Cardiologist:  Denyse Bathe, MD      History of Present Illness: Perry Martinez is a 69 y.o. male who presents for  Chief Complaint  Patient presents with   Follow-up    Nst and echo follow up    No chest pain or SOB.      Past Medical History:  Diagnosis Date   Acute blood loss anemia 04/09/2020   Acute CVA (cerebrovascular accident) (HCC) 08/09/2018   AKI (acute kidney injury) 04/09/2020   Atrial fibrillation (HCC)    Depression    Hyperlipidemia    Hypertension    Hypothyroidism 06/28/2016   OSA on CPAP 06/16/2020   Substance abuse (HCC)    IV drugs when younger     Past Surgical History:  Procedure Laterality Date   COLONOSCOPY N/A 04/11/2020   Procedure: COLONOSCOPY;  Surgeon: Toledo, Ladell POUR, MD;  Location: [facility];  Service: Gastroenterology;  Laterality: N/A;   ESOPHAGOGASTRODUODENOSCOPY N/A 04/11/2020   Procedure: ESOPHAGOGASTRODUODENOSCOPY (EGD);  Surgeon: Toledo, Ladell POUR, MD;  Location: [facility];  Service: Gastroenterology;  Laterality: N/A;   FRACTURE SURGERY     right foot   ROTATOR CUFF REPAIR     right shoulder     Current Outpatient Medications  Medication Sig Dispense Refill   amiodarone  (PACERONE ) 200 MG tablet Take 1 tablet (200 mg total) by mouth daily. 90 tablet 0   apixaban  (ELIQUIS ) 5 MG TABS tablet Take 1 tablet (5 mg total) by mouth 2 (two) times daily. 60 tablet 3   diazepam  (VALIUM ) 10 MG tablet TAKE 1 TABLET BY MOUTH THREE TIMES A DAY 90 tablet 2   ELIQUIS  5 MG TABS tablet TAKE 1 TABLET BY MOUTH TWICE A DAY 60 tablet 3   fenofibrate  160 MG tablet TAKE 1 TABLET BY MOUTH EVERY DAY 90 tablet 1   gabapentin  (NEURONTIN ) 300 MG capsule TAKE 1 CAPSULE BY MOUTH IN THE MORNING AND EVENING AND 3 CAPSULES AT BEDTIME 120 capsule 2   ipratropium-albuterol  (DUONEB) 0.5-2.5 (3) MG/3ML SOLN Take 3 mLs  by nebulization every 6 (six) hours as needed. 90 mL 11   levothyroxine  (SYNTHROID ) 150 MCG tablet Take 1 tablet (150 mcg total) by mouth daily before breakfast. 90 tablet 1   rosuvastatin  (CRESTOR ) 40 MG tablet Take 1 tablet (40 mg total) by mouth daily. 90 tablet 1   traMADol (ULTRAM) 50 MG tablet Take 50 mg by mouth as needed for moderate pain (pain score 4-6).     TRELEGY ELLIPTA  100-62.5-25 MCG/ACT AEPB USE 1 INHALTION BY MOUTH EVERY DAY AS DIRECTED 60 each 3   metoprolol  succinate (TOPROL -XL) 25 MG 24 hr tablet Take 1 tablet (25 mg total) by mouth daily. 90 tablet 1   No current facility-administered medications for this visit.    Allergies:   Pollen extract    Social History:   reports that he has quit smoking. His smoking use included cigarettes. He started smoking about 57 years ago. He has a 28.9 pack-year smoking history. He has never used smokeless tobacco. He reports that he does not drink alcohol and does not use drugs.   Family History:  family history includes Arthritis in his father and mother; Diabetes in his mother; Heart disease in his mother; Heart disease (age of onset: 40) in his father; Hyperlipidemia in his  father; Hypertension in his father; Rheumatic fever in his father.    ROS:     Review of Systems  Constitutional: Negative.   HENT: Negative.    Eyes: Negative.   Respiratory: Negative.    Gastrointestinal: Negative.   Genitourinary: Negative.   Musculoskeletal: Negative.   Skin: Negative.   Neurological: Negative.   Endo/Heme/Allergies: Negative.   Psychiatric/Behavioral: Negative.    All other systems reviewed and are negative.     All other systems are reviewed and negative.    PHYSICAL EXAM: VS:  BP 126/68   Pulse 60   Ht 5' 10 (1.778 m)   Wt 215 lb 6.4 oz (97.7 kg)   SpO2 96%   BMI 30.91 kg/m  , BMI Body mass index is 30.91 kg/m. Last weight:  Wt Readings from Last 3 Encounters:  03/12/24 215 lb 6.4 oz (97.7 kg)  02/06/24 215 lb  (97.5 kg)  02/02/24 212 lb (96.2 kg)     Physical Exam Vitals reviewed.  Constitutional:      Appearance: Normal appearance. He is normal weight.  HENT:     Head: Normocephalic.     Nose: Nose normal.     Mouth/Throat:     Mouth: Mucous membranes are moist.  Eyes:     Pupils: Pupils are equal, round, and reactive to light.  Cardiovascular:     Rate and Rhythm: Normal rate and regular rhythm.     Pulses: Normal pulses.     Heart sounds: Normal heart sounds.  Pulmonary:     Effort: Pulmonary effort is normal.  Abdominal:     General: Abdomen is flat. Bowel sounds are normal.  Musculoskeletal:        General: Normal range of motion.     Cervical back: Normal range of motion.  Skin:    General: Skin is warm.  Neurological:     General: No focal deficit present.     Mental Status: He is alert.  Psychiatric:        Mood and Affect: Mood normal.       EKG:   Recent Labs: 02/02/2024: ALT 20; BUN 20; Creatinine, Ser 1.57; Hemoglobin 13.6; Platelets 202; Potassium 4.9; Sodium 140; TSH 1.570    Lipid Panel    Component Value Date/Time   CHOL 139 02/02/2024 1014   TRIG 129 02/02/2024 1014   HDL 47 02/02/2024 1014   CHOLHDL 3.0 02/02/2024 1014   CHOLHDL 5.4 08/10/2018 0426   VLDL 69 (H) 08/10/2018 0426   LDLCALC 69 02/02/2024 1014      Other studies Reviewed: Additional studies/ records that were reviewed today include:  Review of the above records demonstrates:       No data to display            ASSESSMENT AND PLAN:    ICD-10-CM   1. Preoperative clearance  Z01.818 metoprolol  succinate (TOPROL -XL) 25 MG 24 hr tablet   Stress test was fine, no ischaemia. Advise proceeding with right knee surgery. LOW RISK. Stop elliquis for 4 days prior to surgery and then start next day.    2. Paroxysmal atrial fibrillation (HCC)  I48.0 metoprolol  succinate (TOPROL -XL) 25 MG 24 hr tablet    3. Pure hyperglyceridemia  E78.1 metoprolol  succinate (TOPROL -XL) 25 MG 24 hr  tablet    4. Primary hypertension  I10 metoprolol  succinate (TOPROL -XL) 25 MG 24 hr tablet    5. CKD stage 3b, GFR 30-44 ml/min (HCC)  N18.32 metoprolol  succinate (TOPROL -XL) 25 MG 24  hr tablet    6. SOB (shortness of breath)  R06.02 metoprolol  succinate (TOPROL -XL) 25 MG 24 hr tablet   Normal LVEF diastolic dysfunction, trace AR, on ECHO.    7. Mixed hyperlipidemia  E78.2 metoprolol  succinate (TOPROL -XL) 25 MG 24 hr tablet    8. Cerebrovascular accident (CVA), unspecified mechanism (HCC)  I63.9 metoprolol  succinate (TOPROL -XL) 25 MG 24 hr tablet       Problem List Items Addressed This Visit       Cardiovascular and Mediastinum   CVA (cerebral vascular accident) (HCC)   Relevant Medications   metoprolol  succinate (TOPROL -XL) 25 MG 24 hr tablet   Atrial fibrillation (HCC)   Relevant Medications   metoprolol  succinate (TOPROL -XL) 25 MG 24 hr tablet   Primary hypertension   Relevant Medications   metoprolol  succinate (TOPROL -XL) 25 MG 24 hr tablet     Genitourinary   CKD stage 3b, GFR 30-44 ml/min (HCC)   Relevant Medications   metoprolol  succinate (TOPROL -XL) 25 MG 24 hr tablet     Other   Hyperlipidemia   Relevant Medications   metoprolol  succinate (TOPROL -XL) 25 MG 24 hr tablet   Other Visit Diagnoses       Preoperative clearance    -  Primary   Stress test was fine, no ischaemia. Advise proceeding with right knee surgery. LOW RISK. Stop elliquis for 4 days prior to surgery and then start next day.   Relevant Medications   metoprolol  succinate (TOPROL -XL) 25 MG 24 hr tablet     Pure hyperglyceridemia       Relevant Medications   metoprolol  succinate (TOPROL -XL) 25 MG 24 hr tablet     SOB (shortness of breath)       Normal LVEF diastolic dysfunction, trace AR, on ECHO.   Relevant Medications   metoprolol  succinate (TOPROL -XL) 25 MG 24 hr tablet          Disposition:   Return in about 2 months (around 05/10/2024).    Total time spent: 35  minutes  Signed,  Denyse Bathe, MD  03/12/2024 11:36 AM    [facility] "

## 2024-05-07 ENCOUNTER — Ambulatory Visit: Admitting: Internal Medicine

## 2024-05-09 ENCOUNTER — Ambulatory Visit: Admitting: Cardiovascular Disease

## 2024-06-13 ENCOUNTER — Ambulatory Visit (INDEPENDENT_AMBULATORY_CARE_PROVIDER_SITE_OTHER): Admitting: Vascular Surgery

## 2024-06-13 ENCOUNTER — Encounter (INDEPENDENT_AMBULATORY_CARE_PROVIDER_SITE_OTHER)
